# Patient Record
Sex: Female | Born: 1965
Health system: Southern US, Academic
[De-identification: ages and names within clinical notes are randomized; demographics above are authoritative.]

## PROBLEM LIST (undated history)

## (undated) ENCOUNTER — Telehealth

## (undated) ENCOUNTER — Ambulatory Visit

## (undated) ENCOUNTER — Encounter: Attending: Dermatology | Primary: Dermatology

## (undated) ENCOUNTER — Encounter
Attending: Student in an Organized Health Care Education/Training Program | Primary: Student in an Organized Health Care Education/Training Program

## (undated) ENCOUNTER — Ambulatory Visit: Payer: MEDICAID

## (undated) ENCOUNTER — Ambulatory Visit: Payer: PRIVATE HEALTH INSURANCE

## (undated) ENCOUNTER — Encounter

## (undated) ENCOUNTER — Telehealth
Attending: Student in an Organized Health Care Education/Training Program | Primary: Student in an Organized Health Care Education/Training Program

## (undated) ENCOUNTER — Encounter: Attending: Medical | Primary: Medical

## (undated) ENCOUNTER — Ambulatory Visit
Payer: MEDICAID | Attending: Student in an Organized Health Care Education/Training Program | Primary: Student in an Organized Health Care Education/Training Program

## (undated) ENCOUNTER — Ambulatory Visit: Payer: Medicaid (Managed Care)

## (undated) ENCOUNTER — Encounter: Attending: Internal Medicine | Primary: Internal Medicine

## (undated) ENCOUNTER — Ambulatory Visit
Payer: PRIVATE HEALTH INSURANCE | Attending: Student in an Organized Health Care Education/Training Program | Primary: Student in an Organized Health Care Education/Training Program

## (undated) ENCOUNTER — Ambulatory Visit: Payer: MEDICARE

## (undated) ENCOUNTER — Encounter: Attending: Adult Health | Primary: Adult Health

## (undated) ENCOUNTER — Telehealth: Attending: Plastic and Reconstructive Surgery | Primary: Plastic and Reconstructive Surgery

## (undated) ENCOUNTER — Inpatient Hospital Stay

## (undated) ENCOUNTER — Telehealth: Attending: Dermatology | Primary: Dermatology

## (undated) DIAGNOSIS — IMO0001 Reserved for inherently not codable concepts without codable children: Secondary | ICD-10-CM

## (undated) DIAGNOSIS — N390 Urinary tract infection, site not specified: Secondary | ICD-10-CM

## (undated) DIAGNOSIS — K219 Gastro-esophageal reflux disease without esophagitis: Secondary | ICD-10-CM

## (undated) DIAGNOSIS — Z973 Presence of spectacles and contact lenses: Secondary | ICD-10-CM

## (undated) DIAGNOSIS — J45909 Unspecified asthma, uncomplicated: Secondary | ICD-10-CM

## (undated) DIAGNOSIS — F419 Anxiety disorder, unspecified: Secondary | ICD-10-CM

## (undated) DIAGNOSIS — M1990 Unspecified osteoarthritis, unspecified site: Secondary | ICD-10-CM

## (undated) HISTORY — PX: UMBILICAL HERNIA REPAIR: SHX196

## (undated) HISTORY — PX: HAND SURGERY: SHX662

## (undated) HISTORY — PX: UMBILICAL HERNIA REPAIR: SUR1181

## (undated) HISTORY — PX: ABDOMINAL HYSTERECTOMY: SHX81

## (undated) HISTORY — PX: TRIGGER FINGER RELEASE: SHX641

## (undated) HISTORY — PX: TUBAL LIGATION: SHX77

## (undated) HISTORY — PX: TOE AMPUTATION: SHX809

## (undated) HISTORY — PX: DILATION AND CURETTAGE OF UTERUS: SHX78

## (undated) HISTORY — PX: MOUTH SURGERY: SHX715

## (undated) HISTORY — PX: BUNIONECTOMY: SHX129

## (undated) HISTORY — PX: JOINT REPLACEMENT: SHX530

## (undated) HISTORY — PX: TONSILLECTOMY: SUR1361

## (undated) HISTORY — PX: MULTIPLE TOOTH EXTRACTIONS: SHX2053

---
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
## 1990-07-15 HISTORY — PX: ABDOMINAL HYSTERECTOMY: SHX81

## 2010-07-15 HISTORY — PX: THUMB ARTHROSCOPY: SHX2509

## 2012-02-21 ENCOUNTER — Emergency Department (HOSPITAL_BASED_OUTPATIENT_CLINIC_OR_DEPARTMENT_OTHER)
Admission: EM | Admit: 2012-02-21 | Discharge: 2012-02-21 | Disposition: A | Discharge status: Home / Self Care | Payer: Medicaid Other | Attending: Emergency Medicine | Admitting: Emergency Medicine

## 2012-02-21 ENCOUNTER — Encounter (HOSPITAL_BASED_OUTPATIENT_CLINIC_OR_DEPARTMENT_OTHER): Payer: Self-pay | Admitting: *Deleted

## 2012-02-21 ENCOUNTER — Emergency Department (HOSPITAL_BASED_OUTPATIENT_CLINIC_OR_DEPARTMENT_OTHER): Payer: Medicaid Other

## 2012-02-21 DIAGNOSIS — R079 Chest pain, unspecified: Secondary | ICD-10-CM | POA: Insufficient documentation

## 2012-02-21 DIAGNOSIS — J189 Pneumonia, unspecified organism: Secondary | ICD-10-CM | POA: Insufficient documentation

## 2012-02-21 DIAGNOSIS — R071 Chest pain on breathing: Secondary | ICD-10-CM | POA: Insufficient documentation

## 2012-02-21 DIAGNOSIS — R0789 Other chest pain: Secondary | ICD-10-CM

## 2012-02-21 DIAGNOSIS — R0602 Shortness of breath: Secondary | ICD-10-CM | POA: Insufficient documentation

## 2012-02-21 HISTORY — DX: Reserved for inherently not codable concepts without codable children: IMO0001

## 2012-02-21 HISTORY — DX: Unspecified asthma, uncomplicated: J45.909

## 2012-02-21 HISTORY — DX: Urinary tract infection, site not specified: N39.0

## 2012-02-21 HISTORY — DX: Gastro-esophageal reflux disease without esophagitis: K21.9

## 2012-02-21 LAB — CBC WITH DIFFERENTIAL/PLATELET
Eosinophils Relative: 2 % (ref 0–5)
HCT: 40.6 % (ref 36.0–46.0)
Hemoglobin: 13.8 g/dL (ref 12.0–15.0)
Lymphocytes Relative: 40 % (ref 12–46)
Lymphs Abs: 3.7 10*3/uL (ref 0.7–4.0)
MCV: 81.5 fL (ref 78.0–100.0)
Monocytes Absolute: 0.9 10*3/uL (ref 0.1–1.0)
Monocytes Relative: 10 % (ref 3–12)
Neutro Abs: 4.5 10*3/uL (ref 1.7–7.7)
WBC: 9.3 10*3/uL (ref 4.0–10.5)

## 2012-02-21 LAB — URINALYSIS, ROUTINE W REFLEX MICROSCOPIC
Bilirubin Urine: NEGATIVE
Glucose, UA: NEGATIVE mg/dL
Hgb urine dipstick: NEGATIVE
Nitrite: NEGATIVE
Specific Gravity, Urine: 1.011 (ref 1.005–1.030)
pH: 6 (ref 5.0–8.0)

## 2012-02-21 LAB — URINE MICROSCOPIC-ADD ON

## 2012-02-21 LAB — BASIC METABOLIC PANEL
BUN: 5 mg/dL — ABNORMAL LOW (ref 6–23)
CO2: 28 mEq/L (ref 19–32)
Calcium: 9.3 mg/dL (ref 8.4–10.5)
Chloride: 99 mEq/L (ref 96–112)
Creatinine, Ser: 0.9 mg/dL (ref 0.50–1.10)
Glucose, Bld: 85 mg/dL (ref 70–99)

## 2012-02-21 LAB — TROPONIN I: Troponin I: <0.3 ng/mL (ref <0.30)

## 2012-02-21 MED ORDER — CEPHALEXIN 250 MG PO CAPS
500.0000 mg | ORAL_CAPSULE | Freq: Once | ORAL | Status: AC
  Administered 2012-02-21: 500 mg via ORAL
  Filled 2012-02-21: qty 2

## 2012-02-21 MED ORDER — KETOROLAC TROMETHAMINE 30 MG/ML IJ SOLN
30.0000 mg | Freq: Once | INTRAMUSCULAR | Status: AC
  Administered 2012-02-21: 30 mg via INTRAVENOUS
  Filled 2012-02-21: qty 1

## 2012-02-21 MED ORDER — FLUCONAZOLE 150 MG PO TABS: 150.0000 mg | ORAL_TABLET | Freq: Every day | ORAL | Status: AC

## 2012-02-21 MED ORDER — OXYCODONE-ACETAMINOPHEN 5-325 MG PO TABS: 1.0000 | ORAL_TABLET | ORAL | Status: AC | PRN

## 2012-02-21 MED ORDER — AZITHROMYCIN 250 MG PO TABS
500.0000 mg | ORAL_TABLET | Freq: Once | ORAL | Status: AC
  Administered 2012-02-21: 500 mg via ORAL
  Filled 2012-02-21: qty 2

## 2012-02-21 MED ORDER — CEPHALEXIN 500 MG PO CAPS: 500.0000 mg | ORAL_CAPSULE | Freq: Once | ORAL | Status: AC

## 2012-02-21 MED ORDER — OXYCODONE-ACETAMINOPHEN 5-325 MG PO TABS
1.0000 | ORAL_TABLET | Freq: Once | ORAL | Status: AC
  Administered 2012-02-21: 1 via ORAL
  Filled 2012-02-21: qty 1

## 2012-02-21 MED ORDER — AZITHROMYCIN 250 MG PO TABS: 250.0000 mg | ORAL_TABLET | Freq: Every day | ORAL | Status: AC

## 2012-02-21 MED ORDER — OXYCODONE-ACETAMINOPHEN 5-325 MG PO TABS
ORAL_TABLET | ORAL | Status: AC
  Administered 2012-02-21: 1 via ORAL
  Filled 2012-02-21: qty 1

## 2012-02-21 NOTE — ED Provider Notes (Signed)
History     CSN: [id number]  Arrival date & time 02/21/12  1125   First MD Initiated Contact with Patient 02/21/12 1201      Chief Complaint  Patient presents with  . Chest Pain    (Consider location/radiation/quality/duration/timing/severity/associated sxs/prior treatment) HPI Comments: Janice Johnson presents with left sided sharp chest pain which radiates into her left upper back which started last night at rest. Her pain is constant but worsened with deep inspiration and with movement such as twisting her torso or raising her left arm.  She does have a history of asthma and denies wheezing,  But has had shortness of breath along with cough which has been productive of white thick sputum.  She denies fevers and hemoptysis.  She also denies nausea or vomiting,  Diaphoresis, weakness and palpitations.  She also notes bilateral foot edema, but denies ankle or calf edema or pain.  She has taken no medicines for her pain. She states her symptoms remind her of her last episode of "walking pneumonia" several years ago.  The history is provided by the patient.    Past Medical History  Diagnosis Date  . Asthma   . Reflux   . Chronic UTI     Past Surgical History  Procedure Date  . Joint replacement   . Toe amputation   . Hand surgery   . Abdominal hysterectomy   . Mouth surgery   . Umbilical hernia repair   . Bunionectomy     x 2    No family history on file.  History  Substance Use Topics  . Smoking status: Current Everyday Smoker -- 0.5 packs/day    Types: Cigarettes  . Smokeless tobacco: Not on file  . Alcohol Use: No    OB History    Grav Para Term Preterm Abortions TAB SAB Ect Mult Living                  Review of Systems  Constitutional: Negative for fever and diaphoresis.  HENT: Negative for congestion, sore throat and neck pain.   Eyes: Negative.   Respiratory: Positive for shortness of breath. Negative for chest tightness.   Cardiovascular: Positive for  chest pain. Negative for palpitations and leg swelling.  Gastrointestinal: Negative for nausea, vomiting and abdominal pain.  Genitourinary: Negative.   Musculoskeletal: Negative for joint swelling and arthralgias.  Skin: Negative.  Negative for color change, rash and wound.  Neurological: Negative for dizziness, weakness, light-headedness, numbness and headaches.  Hematological: Negative.   Psychiatric/Behavioral: Negative.     Allergies  Codeine and Morphine and related  Home Medications   Current Outpatient Rx  Name Route Sig Dispense Refill  . ALBUTEROL SULFATE 1.25 MG/3ML IN NEBU Nebulization Take 1 ampule by nebulization every 6 (six) hours as needed.    . ALBUTEROL SULFATE (2.5 MG/3ML) 0.083% IN NEBU Nebulization Take 2.5 mg by nebulization every 6 (six) hours as needed.    Marland Kitchen FLUTICASONE-SALMETEROL 100-50 MCG/DOSE IN AEPB Inhalation Inhale 1 puff into the lungs every 12 (twelve) hours.    . AZITHROMYCIN 250 MG PO TABS Oral Take 1 tablet (250 mg total) by mouth daily. 4 tablet 0  . CEPHALEXIN 500 MG PO CAPS Oral Take 1 capsule (500 mg total) by mouth once. 40 capsule 0  . OXYCODONE-ACETAMINOPHEN 5-325 MG PO TABS Oral Take 1 tablet by mouth every 4 (four) hours as needed for pain. 15 tablet 0    BP 100/60  Pulse 69  Temp 98.5 F (  36.9 C) (Oral)  Resp 20  Ht 5\' 10"  (1.778 m)  Wt 270 lb (122.471 kg)  BMI 38.74 kg/m2  SpO2 100%  Physical Exam  Nursing note and vitals reviewed. Constitutional: She appears well-developed and well-nourished.  HENT:  Head: Normocephalic and atraumatic.  Eyes: Conjunctivae are normal.  Neck: Normal range of motion.  Cardiovascular: Normal rate, regular rhythm, normal heart sounds and intact distal pulses.   Pulmonary/Chest: Effort normal and breath sounds normal. She has no wheezes. She has no rales. She exhibits tenderness.    Abdominal: Soft. Bowel sounds are normal. There is no tenderness.  Musculoskeletal: Normal range of motion. She  exhibits no edema and no tenderness.  Neurological: She is alert.  Skin: Skin is warm and dry.  Psychiatric: She has a normal mood and affect.    ED Course  Procedures (including critical care time)  Labs Reviewed  CBC WITH DIFFERENTIAL - Abnormal; Notable for the following:    Platelets 440 (*)     All other components within normal limits  BASIC METABOLIC PANEL - Abnormal; Notable for the following:    BUN 5 (*)     GFR calc non Af Amer 75 (*)     GFR calc Af Amer 87 (*)     All other components within normal limits  URINALYSIS, ROUTINE W REFLEX MICROSCOPIC - Abnormal; Notable for the following:    Leukocytes, UA SMALL (*)     All other components within normal limits  URINE MICROSCOPIC-ADD ON - Abnormal; Notable for the following:    Squamous Epithelial / LPF FEW (*)     Bacteria, UA FEW (*)     All other components within normal limits  TROPONIN I   Dg Chest 2 View  02/21/2012  *RADIOLOGY REPORT*  Clinical Data: Chest pain.  Nausea and chills.  CHEST - 2 VIEW  Comparison: None.  Findings: Patchy airspace opacities present in the right lower lobe.  This is suspicious for pneumonia over atelectasis.  Left base appears within normal limits.  Cardiopericardial silhouette normal. Monitoring leads are projected over the chest.  IMPRESSION: Patchy right lower lobe airspace disease favored to represent pneumonia over atelectasis.  Original Report Authenticated By: Andreas Newport, M.D.     1. Community acquired pneumonia     Pt given zithromax 500 mg po,  keflext 500 mg po prior to dc home.  MDM  Pt is perc negative.  No risk factors for PE/dvt.   Re-exam,  Still with no wheezing,  No respiratory distress.  Pt to continue taking her albuterol nebs,  Advair,  Prescribed oxycodone for pain and cough,  zithromax and keflex to cover for community acquired pneumonia.     Labs , xrays reviewed prior to dc home. Urine cx ordered. Suspect pain at least partially from increased cough  causing chest wall pain/musculoskeletal,  Reproducible.   Date: 02/21/2012  Rate: 70  Rhythm: normal sinus rhythm  QRS Axis: normal  Intervals: normal  ST/T Wave abnormalities: normal  Conduction Disutrbances:none  Narrative Interpretation:  Possible LVH with J point elevation in V2.  Old EKG Reviewed: none available          Burgess Amor, [place] 02/21/12 1436  Burgess Amor, [place] 02/21/12 1438   Prior to dc,  Pt reports abx gives her vaginal yeast infections,  Diflucan150 mg x 1 prescribed.    Burgess Amor, PA 02/21/12 1447

## 2012-02-22 NOTE — ED Provider Notes (Signed)
Medical screening examination/treatment/procedure(s) were performed by non-physician practitioner and as supervising physician I was immediately available for consultation/collaboration.   Hurman Horn, MD 02/22/12 [phone]

## 2013-02-20 IMAGING — CR DG CHEST 2V
2 series · 2 of 2 positions shown · non-contrast
Comparison: None.

CLINICAL DATA: Chest pain.  Nausea and chills.

CHEST - 2 VIEW

[w chest pa]
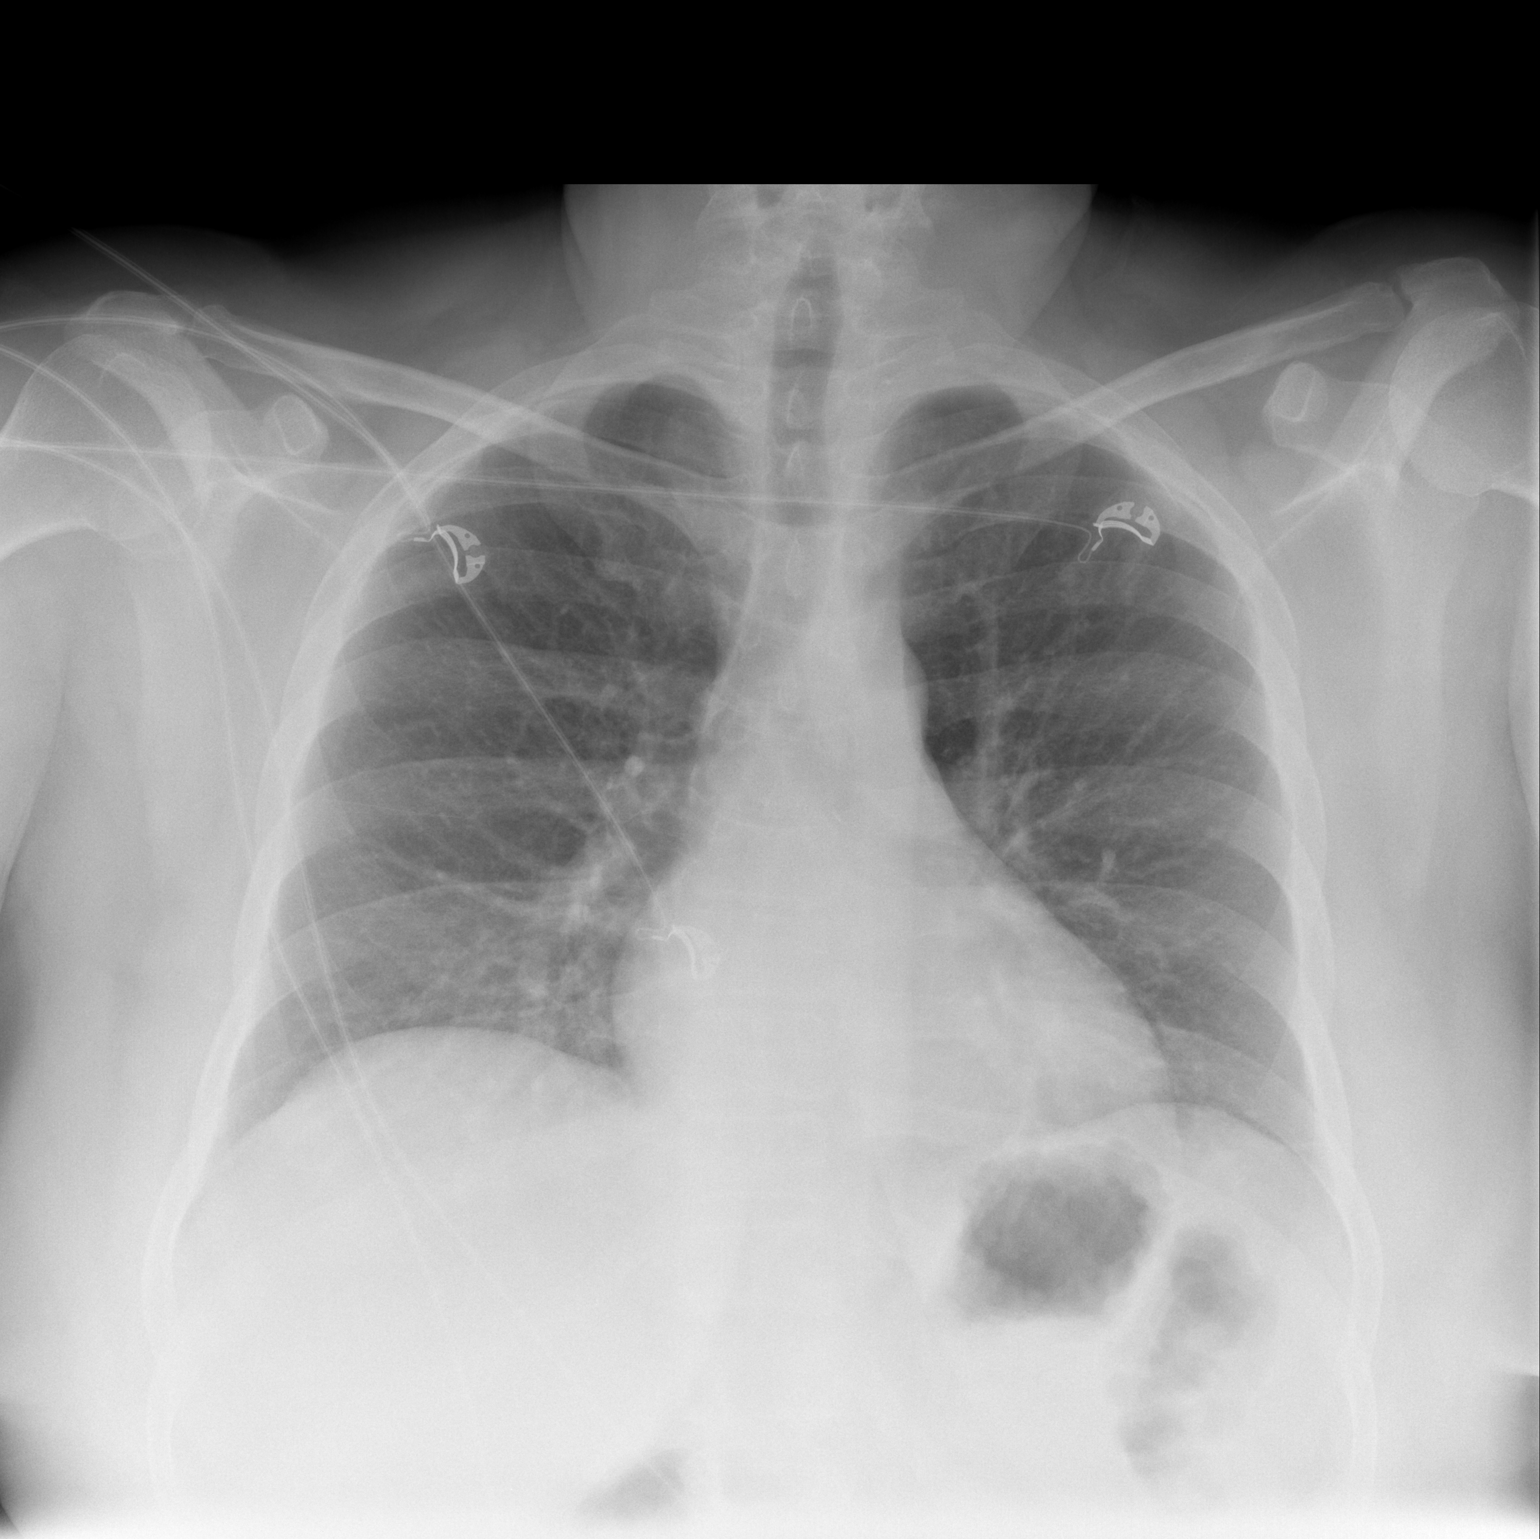

[w chest lat]
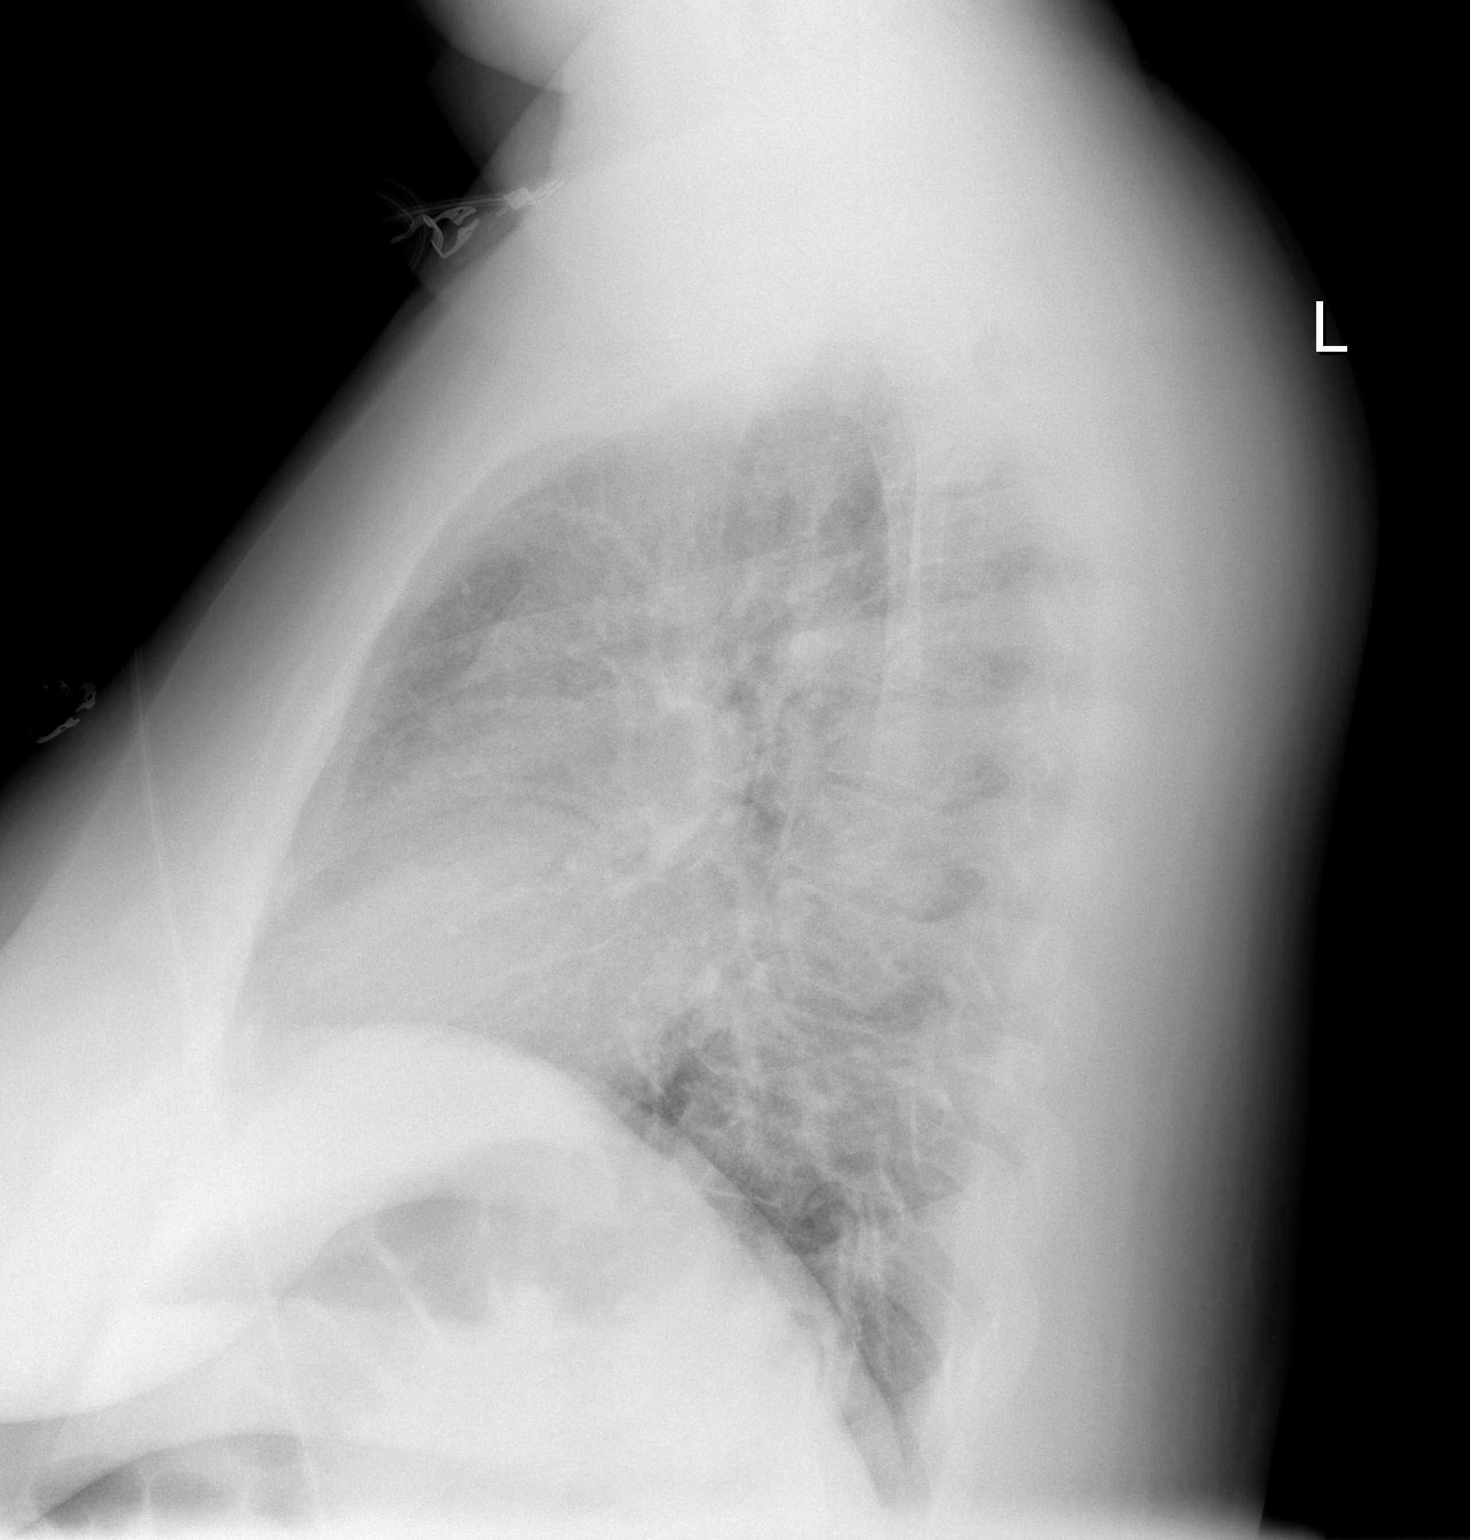

[2 of 2 positions shown; findings below may reference images not displayed]

FINDINGS: Patchy airspace opacities present in the right lower
lobe.  This is suspicious for pneumonia over atelectasis.  Left
base appears within normal limits.  Cardiopericardial silhouette
normal. Monitoring leads are projected over the chest.
IMPRESSION: Patchy right lower lobe airspace disease favored to represent
pneumonia over atelectasis.

## 2013-05-13 ENCOUNTER — Encounter: Payer: Self-pay | Admitting: Physical Medicine and Rehabilitation

## 2013-05-24 ENCOUNTER — Encounter: Payer: Medicaid Other | Admitting: Physical Medicine and Rehabilitation

## 2013-05-24 NOTE — Progress Notes (Unsigned)
  Subjective:    Patient ID: Janice Johnson, female    DOB: Sep 25, 1965, 47 y.o.   MRN: [id number]  HPI Pain Inventory Average Pain 10 Pain Right Now 10 My pain is constant and aching  In the last 24 hours, has pain interfered with the following? General activity 7 Relation with others 5 Enjoyment of life 8 What TIME of day is your pain at its worst? night Sleep (in general) Poor  Pain is worse with: walking, bending, sitting, inactivity and standing Pain improves with: nothing Relief from Meds: no pain meds  Mobility walk without assistance how many minutes can you walk? 10 with pain ability to climb steps?  yes do you drive?  no transfers alone Do you have any goals in this area?  no  Function not employed: date last employed na Do you have any goals in this area?  no  Neuro/Psych bladder control problems trouble walking  Prior Studies Any changes since last visit?  no  Physicians involved in your care Any changes since last visit?  no   No family history on file. History   Social History  . Marital Status: Single    Spouse Name: N/A    Number of Children: N/A  . Years of Education: N/A   Social History Main Topics  . Smoking status: Current Every Day Smoker -- 0.50 packs/day    Types: Cigarettes  . Smokeless tobacco: Not on file  . Alcohol Use: No  . Drug Use: No  . Sexual Activity: Yes    Birth Control/ Protection: Surgical   Other Topics Concern  . Not on file   Social History Narrative  . No narrative on file   Past Surgical History  Procedure Laterality Date  . Joint replacement    . Toe amputation    . Hand surgery    . Abdominal hysterectomy    . Mouth surgery    . Umbilical hernia repair    . Bunionectomy      x 2   Past Medical History  Diagnosis Date  . Asthma   . Reflux   . Chronic UTI    BP 144/59  Pulse 78  Resp 14  Ht 5\' 8"  (1.727 m)  Wt 270 lb (122.471 kg)  BMI 41.06 kg/m2  SpO2 100%      Review of Systems   Endocrine:       Low blood sugar  Genitourinary:       Bladder control problems  Musculoskeletal: Positive for back pain and gait problem.  All other systems reviewed and are negative.       Objective:   Physical Exam        Assessment & Plan:  Patient not seen because she was upset that she would not be prescribed narcotics at today's visit. Urine discarded and refund was given to patient.

## 2013-09-15 ENCOUNTER — Encounter (HOSPITAL_BASED_OUTPATIENT_CLINIC_OR_DEPARTMENT_OTHER): Payer: Self-pay | Admitting: *Deleted

## 2013-09-15 ENCOUNTER — Other Ambulatory Visit: Payer: Self-pay | Admitting: Orthopedic Surgery

## 2013-09-15 NOTE — Progress Notes (Signed)
Pt has asthma and denies any cardiac or sleep apnea Had rt thumb done [place] 2012 with no problems

## 2013-09-16 ENCOUNTER — Encounter (HOSPITAL_BASED_OUTPATIENT_CLINIC_OR_DEPARTMENT_OTHER): Payer: Self-pay | Admitting: *Deleted

## 2013-09-17 NOTE — H&P (Signed)
Janice Johnson is an 48 y.o. female.   CC / Reason for Visit: Left hand and wrist pain HPI: This patient is a 48 year old female whom I have previously treated while at [facility].  I performed a right LRTI and de Quervain's release.  She believes this was in August of either 2012 are 2013.  She reports that she has had recent progressive symptoms of the same kind on the left side and desires to proceed surgically.  She has also had injections and splinting and continues to have symptoms.  Past Medical History  Diagnosis Date  . Reflux   . Chronic UTI   . Asthma   . GERD (gastroesophageal reflux disease)   . Arthritis   . Wears glasses     Past Surgical History  Procedure Laterality Date  . Joint replacement    . Toe amputation    . Hand surgery    . Abdominal hysterectomy    . Mouth surgery    . Umbilical hernia repair    . Bunionectomy      x 2  . Tonsillectomy    . Dilation and curettage of uterus    . Abdominal hysterectomy  1992  . Tubal ligation    . Multiple tooth extractions    . Umbilical hernia repair      age 89  . Toe amputation      right pinky toe  . Thumb arthroscopy  2012    reconstruction right thumb    History reviewed. No pertinent family history. Social History:  reports that she has been smoking Cigarettes.  She has been smoking about 0.50 packs per day. She does not have any smokeless tobacco history on file. She reports that she does not drink alcohol or use illicit drugs.  Allergies:  Allergies  Allergen Reactions  . Codeine   . Codeine Itching  . Morphine And Related     No prescriptions prior to admission    No results found for this or any previous visit (from the past 48 hour(s)). No results found.  Review of Systems  All other systems reviewed and are negative.    Height 5\' 10"  (1.778 m), weight 122.471 kg (270 lb). Physical Exam  Constitutional:  WD, WN, NAD HEENT:  NCAT, EOMI Neuro/Psych:  Alert &  oriented to person, place, and time; appropriate mood & affect Lymphatic: No generalized UE edema or lymphadenopathy Extremities / MSK:  Both UE are normal with respect to appearance, ranges of motion, joint stability, muscle strength/tone, sensation, & perfusion except as otherwise noted:  The right sided incisions have healed nicely.  On the left side, she has pain with TMC stress and grind testing, as well as tenderness over the first dorsal compartment.  Finkelstein and EPB stress are positive.  Key pinch: Right 11/left 3 and painful.  Labs / Xrays:  Left TMC series ordered and obtained today reveals grade 2-3 arthritic change the left TMC joint, and evidence for prior trapeziectomy on the right.    Assessment: Left TMC osteoarthritis and de Quervain's tenosynovitis  Plan:  I discussed these findings with her and we reviewed details of operative treatment, specifically a thumb suspension arthroplasty (LRTI) with a de Quervain's release.  We will likely proceed next week.  The details of the operative procedure were discussed with the patient.  Questions were invited and answered.  In addition to the goal of the procedure, the risks of the procedure to include but not limited to bleeding;  infection; damage to the nerves or blood vessels that could result in bleeding, numbness, weakness, chronic pain, and the need for additional procedures; stiffness; the need for revision surgery; and anesthetic risks, the worst of which is death, were reviewed.  No specific outcome was guaranteed or implied.  Informed consent was obtained.    Janice Johnson A. 09/17/2013, 12:20 PM

## 2013-09-20 ENCOUNTER — Ambulatory Visit (HOSPITAL_BASED_OUTPATIENT_CLINIC_OR_DEPARTMENT_OTHER)
Admission: RE | Admit: 2013-09-20 | Discharge: 2013-09-20 | Disposition: A | Discharge status: Home / Self Care | Payer: Medicaid Other | Source: Ambulatory Visit | Attending: Orthopedic Surgery | Admitting: Orthopedic Surgery

## 2013-09-20 ENCOUNTER — Encounter (HOSPITAL_BASED_OUTPATIENT_CLINIC_OR_DEPARTMENT_OTHER)
Admission: RE | Disposition: A | Discharge status: Home / Self Care | Payer: Self-pay | Source: Ambulatory Visit | Attending: Orthopedic Surgery

## 2013-09-20 ENCOUNTER — Encounter (HOSPITAL_BASED_OUTPATIENT_CLINIC_OR_DEPARTMENT_OTHER): Payer: Self-pay | Admitting: Anesthesiology

## 2013-09-20 ENCOUNTER — Encounter (HOSPITAL_BASED_OUTPATIENT_CLINIC_OR_DEPARTMENT_OTHER): Payer: Medicaid Other | Admitting: Anesthesiology

## 2013-09-20 ENCOUNTER — Ambulatory Visit (HOSPITAL_BASED_OUTPATIENT_CLINIC_OR_DEPARTMENT_OTHER): Payer: Medicaid Other | Admitting: Anesthesiology

## 2013-09-20 DIAGNOSIS — Z9660 Presence of unspecified orthopedic joint implant: Secondary | ICD-10-CM | POA: Insufficient documentation

## 2013-09-20 DIAGNOSIS — Z885 Allergy status to narcotic agent status: Secondary | ICD-10-CM | POA: Insufficient documentation

## 2013-09-20 DIAGNOSIS — F17200 Nicotine dependence, unspecified, uncomplicated: Secondary | ICD-10-CM | POA: Insufficient documentation

## 2013-09-20 DIAGNOSIS — K219 Gastro-esophageal reflux disease without esophagitis: Secondary | ICD-10-CM | POA: Insufficient documentation

## 2013-09-20 DIAGNOSIS — M654 Radial styloid tenosynovitis [de Quervain]: Secondary | ICD-10-CM | POA: Insufficient documentation

## 2013-09-20 DIAGNOSIS — N390 Urinary tract infection, site not specified: Secondary | ICD-10-CM | POA: Insufficient documentation

## 2013-09-20 DIAGNOSIS — J45909 Unspecified asthma, uncomplicated: Secondary | ICD-10-CM | POA: Insufficient documentation

## 2013-09-20 DIAGNOSIS — M19049 Primary osteoarthritis, unspecified hand: Secondary | ICD-10-CM | POA: Insufficient documentation

## 2013-09-20 DIAGNOSIS — S98139A Complete traumatic amputation of one unspecified lesser toe, initial encounter: Secondary | ICD-10-CM | POA: Insufficient documentation

## 2013-09-20 HISTORY — DX: Presence of spectacles and contact lenses: Z97.3

## 2013-09-20 HISTORY — PX: DORSAL COMPARTMENT RELEASE: SHX5039

## 2013-09-20 HISTORY — PX: CARPOMETACARPEL SUSPENSION PLASTY: SHX5005

## 2013-09-20 HISTORY — DX: Unspecified osteoarthritis, unspecified site: M19.90

## 2013-09-20 HISTORY — DX: Gastro-esophageal reflux disease without esophagitis: K21.9

## 2013-09-20 SURGERY — CARPOMETACARPEL (CMC) SUSPENSION PLASTY
Anesthesia: General | Site: Thumb | Laterality: Left

## 2013-09-20 MED ORDER — EPHEDRINE SULFATE 50 MG/ML IJ SOLN
INTRAMUSCULAR | Status: DC | PRN
  Administered 2013-09-20: 10 mg via INTRAVENOUS

## 2013-09-20 MED ORDER — LIDOCAINE HCL (CARDIAC) 20 MG/ML IV SOLN
INTRAVENOUS | Status: DC | PRN
  Administered 2013-09-20: 100 mg via INTRAVENOUS

## 2013-09-20 MED ORDER — PROPOFOL 10 MG/ML IV BOLUS
INTRAVENOUS | Status: DC | PRN
  Administered 2013-09-20: 350 mg via INTRAVENOUS

## 2013-09-20 MED ORDER — DIPHENHYDRAMINE HCL 50 MG/ML IJ SOLN
INTRAMUSCULAR | Status: AC
  Filled 2013-09-20: qty 1

## 2013-09-20 MED ORDER — FENTANYL CITRATE 0.05 MG/ML IJ SOLN: 50.0000 ug | INTRAMUSCULAR | Status: DC | PRN

## 2013-09-20 MED ORDER — MIDAZOLAM HCL 2 MG/2ML IJ SOLN: 1.0000 mg | INTRAMUSCULAR | Status: DC | PRN

## 2013-09-20 MED ORDER — OXYCODONE HCL 5 MG PO TABS: 5.0000 mg | ORAL_TABLET | Freq: Once | ORAL | Status: DC | PRN

## 2013-09-20 MED ORDER — FENTANYL CITRATE 0.05 MG/ML IJ SOLN
INTRAMUSCULAR | Status: DC | PRN
  Administered 2013-09-20 (×4): 25 ug via INTRAVENOUS
  Administered 2013-09-20: 100 ug via INTRAVENOUS

## 2013-09-20 MED ORDER — HYDROMORPHONE HCL PF 1 MG/ML IJ SOLN
INTRAMUSCULAR | Status: AC
  Filled 2013-09-20: qty 1

## 2013-09-20 MED ORDER — ONDANSETRON HCL 4 MG/2ML IJ SOLN
INTRAMUSCULAR | Status: DC | PRN
  Administered 2013-09-20: 4 mg via INTRAVENOUS

## 2013-09-20 MED ORDER — CEFAZOLIN SODIUM-DEXTROSE 2-3 GM-% IV SOLR
2.0000 g | INTRAVENOUS | Status: AC
  Administered 2013-09-20: 3 g via INTRAVENOUS

## 2013-09-20 MED ORDER — LIDOCAINE-EPINEPHRINE 1 %-1:100000 IJ SOLN
INTRAMUSCULAR | Status: AC
  Filled 2013-09-20: qty 1

## 2013-09-20 MED ORDER — LACTATED RINGERS IV SOLN
INTRAVENOUS | Status: DC
  Administered 2013-09-20: 08:00:00 via INTRAVENOUS

## 2013-09-20 MED ORDER — DIPHENHYDRAMINE HCL 50 MG/ML IJ SOLN
12.5000 mg | Freq: Once | INTRAMUSCULAR | Status: AC
  Administered 2013-09-20: 12.5 mg via INTRAVENOUS

## 2013-09-20 MED ORDER — BUPIVACAINE-EPINEPHRINE 0.5% -1:200000 IJ SOLN
INTRAMUSCULAR | Status: DC | PRN
  Administered 2013-09-20: 20 mL

## 2013-09-20 MED ORDER — OXYCODONE HCL 5 MG/5ML PO SOLN: 5.0000 mg | Freq: Once | ORAL | Status: DC | PRN

## 2013-09-20 MED ORDER — OXYCODONE-ACETAMINOPHEN 5-325 MG PO TABS: 1.0000 | ORAL_TABLET | ORAL | Status: DC | PRN

## 2013-09-20 MED ORDER — DEXAMETHASONE SODIUM PHOSPHATE 4 MG/ML IJ SOLN
INTRAMUSCULAR | Status: DC | PRN
  Administered 2013-09-20: 10 mg via INTRAVENOUS

## 2013-09-20 MED ORDER — ONDANSETRON HCL 4 MG/2ML IJ SOLN: 4.0000 mg | Freq: Once | INTRAMUSCULAR | Status: DC | PRN

## 2013-09-20 MED ORDER — HYDROMORPHONE HCL PF 1 MG/ML IJ SOLN
0.2500 mg | INTRAMUSCULAR | Status: DC | PRN
  Administered 2013-09-20 (×4): 0.5 mg via INTRAVENOUS

## 2013-09-20 MED ORDER — LACTATED RINGERS IV SOLN
INTRAVENOUS | Status: DC
  Administered 2013-09-20: 20 mL/h via INTRAVENOUS

## 2013-09-20 MED ORDER — MIDAZOLAM HCL 5 MG/5ML IJ SOLN
INTRAMUSCULAR | Status: DC | PRN
  Administered 2013-09-20: 2 mg via INTRAVENOUS

## 2013-09-20 MED ORDER — HYDROMORPHONE HCL PF 1 MG/ML IJ SOLN: 0.5000 mg | INTRAMUSCULAR | Status: DC | PRN

## 2013-09-20 MED ORDER — CEFAZOLIN SODIUM-DEXTROSE 2-3 GM-% IV SOLR
INTRAVENOUS | Status: AC
  Filled 2013-09-20: qty 50

## 2013-09-20 MED ORDER — HYDROMORPHONE HCL PF 1 MG/ML IJ SOLN
0.2500 mg | INTRAMUSCULAR | Status: DC | PRN
  Administered 2013-09-20 (×2): 0.5 mg via INTRAVENOUS

## 2013-09-20 MED ORDER — LACTATED RINGERS IV SOLN
INTRAVENOUS | Status: DC | PRN
  Administered 2013-09-20: 07:00:00 via INTRAVENOUS

## 2013-09-20 MED ORDER — CHLORHEXIDINE GLUCONATE 4 % EX LIQD: 60.0000 mL | Freq: Once | CUTANEOUS | Status: DC

## 2013-09-20 SURGICAL SUPPLY — 62 items
BIT DRILL 11/64XX180123XX4 (BIT) ×1
BIT DRILL 11/64XX180123XX4.3 (BIT) ×1 IMPLANT
BLADE AVERAGE 25MMX9MM (BLADE) ×1
BLADE AVERAGE 25X9 (BLADE) ×2 IMPLANT
BLADE MINI RND TIP GREEN BEAV (BLADE) IMPLANT
BLADE SURG 15 STRL LF DISP TIS (BLADE) ×2 IMPLANT
BLADE SURG 15 STRL SS (BLADE) ×4
BNDG COHESIVE 3X5 TAN STRL LF (GAUZE/BANDAGES/DRESSINGS) ×3 IMPLANT
BNDG COHESIVE 4X5 TAN STRL (GAUZE/BANDAGES/DRESSINGS) ×3 IMPLANT
BNDG ESMARK 4X9 LF (GAUZE/BANDAGES/DRESSINGS) ×3 IMPLANT
BNDG GAUZE ELAST 4 BULKY (GAUZE/BANDAGES/DRESSINGS) ×6 IMPLANT
CHLORAPREP W/TINT 26ML (MISCELLANEOUS) ×3 IMPLANT
CORDS BIPOLAR (ELECTRODE) ×3 IMPLANT
COVER MAYO STAND STRL (DRAPES) ×3 IMPLANT
COVER TABLE BACK 60X90 (DRAPES) ×3 IMPLANT
CUFF TOURNIQUET SINGLE 18IN (TOURNIQUET CUFF) ×3 IMPLANT
DECANTER SPIKE VIAL GLASS SM (MISCELLANEOUS) IMPLANT
DRAPE EXTREMITY T 121X128X90 (DRAPE) ×3 IMPLANT
DRAPE SURG 17X23 STRL (DRAPES) ×3 IMPLANT
DRILL BIT 1/8DIAX5INL DISPOSE (BIT) ×3 IMPLANT
DRILL BIT 11/64XX180123XX4.3 (BIT) ×2
DRSG EMULSION OIL 3X3 NADH (GAUZE/BANDAGES/DRESSINGS) ×3 IMPLANT
GLOVE BIO SURGEON STRL SZ7.5 (GLOVE) ×3 IMPLANT
GLOVE BIOGEL PI IND STRL 7.0 (GLOVE) ×1 IMPLANT
GLOVE BIOGEL PI IND STRL 8 (GLOVE) ×1 IMPLANT
GLOVE BIOGEL PI INDICATOR 7.0 (GLOVE) ×2
GLOVE BIOGEL PI INDICATOR 8 (GLOVE) ×2
GLOVE ECLIPSE 6.5 STRL STRAW (GLOVE) ×3 IMPLANT
GOWN STRL REUS W/ TWL LRG LVL3 (GOWN DISPOSABLE) ×2 IMPLANT
GOWN STRL REUS W/TWL LRG LVL3 (GOWN DISPOSABLE) ×4
K-WIRE .062X4 (WIRE) ×3 IMPLANT
LOOP VESSEL MAXI BLUE (MISCELLANEOUS) IMPLANT
LOOP VESSEL MINI RED (MISCELLANEOUS) IMPLANT
NDL SAFETY ECLIPSE 18X1.5 (NEEDLE) IMPLANT
NEEDLE HYPO 18GX1.5 SHARP (NEEDLE)
NEEDLE HYPO 25X1 1.5 SAFETY (NEEDLE) ×3 IMPLANT
NS IRRIG 1000ML POUR BTL (IV SOLUTION) ×3 IMPLANT
PACK BASIN DAY SURGERY FS (CUSTOM PROCEDURE TRAY) ×3 IMPLANT
PADDING CAST ABS 3INX4YD NS (CAST SUPPLIES)
PADDING CAST ABS 4INX4YD NS (CAST SUPPLIES) ×2
PADDING CAST ABS COTTON 3X4 (CAST SUPPLIES) IMPLANT
PADDING CAST ABS COTTON 4X4 ST (CAST SUPPLIES) ×1 IMPLANT
SHEET MEDIUM DRAPE 40X70 STRL (DRAPES) ×3 IMPLANT
SLEEVE SCD COMPRESS KNEE MED (MISCELLANEOUS) ×3 IMPLANT
SPLINT FAST PLASTER 5X30 (CAST SUPPLIES)
SPLINT PLASTER CAST FAST 5X30 (CAST SUPPLIES) IMPLANT
SPLINT PLASTER CAST XFAST 3X15 (CAST SUPPLIES) ×8 IMPLANT
SPLINT PLASTER XTRA FASTSET 3X (CAST SUPPLIES) ×16
SPONGE GAUZE 4X4 12PLY (GAUZE/BANDAGES/DRESSINGS) ×3 IMPLANT
SPONGE GAUZE 4X4 12PLY STER LF (GAUZE/BANDAGES/DRESSINGS) ×3 IMPLANT
STOCKINETTE 4X48 STRL (DRAPES) ×3 IMPLANT
SUT ETHIBOND 3-0 V-5 (SUTURE) ×3 IMPLANT
SUT STEEL 4 (SUTURE) ×3 IMPLANT
SUT VICRYL RAPIDE 4-0 (SUTURE) IMPLANT
SUT VICRYL RAPIDE 4/0 PS 2 (SUTURE) ×6 IMPLANT
SYR BULB 3OZ (MISCELLANEOUS) ×3 IMPLANT
SYR CONTROL 10ML LL (SYRINGE) ×3 IMPLANT
SYRINGE 10CC LL (SYRINGE) IMPLANT
SYSTEM CHEST DRAIN TLS 7FR (DRAIN) IMPLANT
TOWEL OR 17X24 6PK STRL BLUE (TOWEL DISPOSABLE) ×3 IMPLANT
TOWEL OR NON WOVEN STRL DISP B (DISPOSABLE) ×3 IMPLANT
UNDERPAD 30X30 INCONTINENT (UNDERPADS AND DIAPERS) ×3 IMPLANT

## 2013-09-20 NOTE — Anesthesia Procedure Notes (Signed)
Procedure Name: LMA Insertion Date/Time: 09/20/2013 7:53 AM Performed by: Gar GibbonKEETON, Jahron Hunsinger S Pre-anesthesia Checklist: Patient identified, Emergency Drugs available, Suction available and Patient being monitored Patient Re-evaluated:Patient Re-evaluated prior to inductionOxygen Delivery Method: Circle System Utilized Preoxygenation: Pre-oxygenation with 100% oxygen Intubation Type: IV induction Ventilation: Mask ventilation without difficulty LMA: LMA with gastric port inserted LMA Size: 4.0 Number of attempts: 1 Placement Confirmation: positive ETCO2 Tube secured with: Tape Dental Injury: Teeth and Oropharynx as per pre-operative assessment

## 2013-09-20 NOTE — Interval H&P Note (Signed)
History and Physical Interval Note:  09/20/2013 7:44 AM  Janice Johnson  has presented today for surgery, with the diagnosis of LEFT THUMB TMC OA/DEQUARVAIN TENOSYNOVITIS  The various methods of treatment have been discussed with the patient and family. After consideration of risks, benefits and other options for treatment, the patient has consented to  Procedure(s) with comments: LEFT THUMB SUSPENSION ARTHROPLASTY (Left) - ANESTHESIA: GENERAL/PRE-OP BLOCK LEFT THUMB DEQUERVAIN RELEASE (Left) as a surgical intervention .  The patient's history has been reviewed, patient examined, no change in status, stable for surgery.  I have reviewed the patient's chart and labs.  Questions were answered to the patient's satisfaction.     Miaa Latterell A.

## 2013-09-20 NOTE — Anesthesia Preprocedure Evaluation (Signed)
Anesthesia Evaluation  Patient identified by MRN, date of birth, ID band Patient awake    Reviewed: Allergy & Precautions, H&P , NPO status , Patient's Chart, lab work & pertinent test results  Airway Mallampati: I TM Distance: >3 FB Neck ROM: Full    Dental  (+) Edentulous Upper, Edentulous Lower, Dental Advisory Given   Pulmonary asthma , Current Smoker,  breath sounds clear to auscultation        Cardiovascular Rhythm:Regular Rate:Normal     Neuro/Psych    GI/Hepatic GERD-  Medicated and Controlled,  Endo/Other    Renal/GU      Musculoskeletal   Abdominal   Peds  Hematology   Anesthesia Other Findings   Reproductive/Obstetrics                           Anesthesia Physical Anesthesia Plan  ASA: III  Anesthesia Plan: General   Post-op Pain Management:    Induction: Intravenous  Airway Management Planned: LMA  Additional Equipment:   Intra-op Plan:   Post-operative Plan: Extubation in OR  Informed Consent: I have reviewed the patients History and Physical, chart, labs and discussed the procedure including the risks, benefits and alternatives for the proposed anesthesia with the patient or authorized representative who has indicated his/her understanding and acceptance.   Dental advisory given  Plan Discussed with: CRNA, Anesthesiologist and Surgeon  Anesthesia Plan Comments:         Anesthesia Quick Evaluation

## 2013-09-20 NOTE — Transfer of Care (Signed)
Immediate Anesthesia Transfer of Care Note  Patient: Janice Johnson  Procedure(s) Performed: Procedure(s): LEFT THUMB SUSPENSION ARTHROPLASTY (Left) LEFT THUMB DEQUERVAIN RELEASE (Left)  Patient Location: PACU  Anesthesia Type:General  Level of Consciousness: awake, sedated, patient cooperative and confused  Airway & Oxygen Therapy: Patient Spontanous Breathing and Patient connected to face mask oxygen  Post-op Assessment: Report given to PACU RN and Post -op Vital signs reviewed and stable  Post vital signs: Reviewed and stable  Complications: No apparent anesthesia complications

## 2013-09-20 NOTE — Op Note (Signed)
09/20/2013  10:54 AM  PATIENT:  Janice Johnson  48 y.o. female  PRE-OPERATIVE DIAGNOSIS:  Left TMC osteoarthritis and de Quervain's tenosynovitis  POST-OPERATIVE DIAGNOSIS:  Same  PROCEDURE:  Left thumb suspension arthroplasty (LRTI) and de Quervain's release/first dorsal compartment plasty  SURGEON: Cliffton Asters. Janee Morn, MD  PHYSICIAN ASSISTANT: None  ANESTHESIA:  general  SPECIMENS:  None  DRAINS:   None  PREOPERATIVE INDICATIONS:  Josee Speece is a  48 y.o. female with left TMC OA and de Quervain's tenosynovitis  The risks benefits and alternatives were discussed with the patient preoperatively including but not limited to the risks of infection, bleeding, nerve injury, cardiopulmonary complications, the need for revision surgery, among others, and the patient verbalized understanding and consented to proceed.  OPERATIVE IMPLANTS: None other than suture  OPERATIVE PROCEDURE:  After receiving prophylactic antibiotics, the patient was escorted to the operative theatre and placed in a supine position.  General anesthesia was administered A surgical "time-out" was performed during which the planned procedure, proposed operative site, and the correct patient identity were compared to the operative consent and agreement confirmed by the circulating nurse according to current facility policy.  Following application of a tourniquet to the operative extremity, the exposed skin was prepped with Chloraprep and draped in the usual sterile fashion.  The limb was exsanguinated with an Esmarch bandage and the tourniquet inflated to approximately higher than systolic BP.  A Wagner incision was made sharply with a scalpel. Subcutaneous tissues were dissected with blunt and spreading dissection with care to identify and elevate superficial radial nerve branches and the dorsal flap. There was an accessory APL tendon inserting into the thenar muscles, and longitudinal split was made between the  true APL and the accessory APL exposing the joint. The joint was inspected found to be substantially arthritic and then the capsule was reflected off the trapezium across the dorsum and also some volarly with care to look for protect the FCR tendon. The trapezium was excised piecemeal. The scaphotrapezoid joint was inspected and found not to require additional surgical treatment. The base of the metacarpal was resected with a saw. Interposition space was irrigated. A hole was made in the metacarpal base for passage of the suspensory ligament by first using a 0.062 inch K wire and then successively enlarging it with 2 different drill bit. A longitudinal half strip of the FCR was harvested in standard fashion and brought through the tunnel the first metacarpal where it was passed then proximally and down deep through the capsule into the interposition space. It was tied and a half hitch upon itself around the intact portion of the FCR and tension with the thumb neither distracted or compressed. The knot was secured with 30 Mersilene suture which was also used to secure the tendon to the dorsal periosteum at the base of the metacarpal. The remainder of the harvested FCR was then fanfolded an anchovy style and secured into the interposition space with 30 Mersilene suture that had first been placed in the depths of the resection space in the deep capsule. 30 Mersilene was used to close the capsular layer and reapproximate the 2 portions of the APL tendon. An oblique incision was made over the first dorsal compartment tendons. Skin was incised sharply with a scalpel and subcutaneous tissues dissected bluntly spreading dissection identifying and protecting the superficial radial nerve. The first dorsal compartment sheath was split in a Z-plasty fashion and then reapproximated with 4-0 Vicryl Rapide interrupted suture with the Z-plasty  flaps and elongated fashion creating more space for the tendons. The tendons hasn't  thickened tenosynovium upon them and there was no separate sub-sheath for the EPB tendon. All wounds were copiously irrigated and then closed with 4-0 Vicryl Rapide interrupted sutures or horizontal mattress running sutures in combination. A total of 20 mL's of half percent Marcaine with epinephrine was instilled in and around the regions of the incisions the interposition space and a short arm thumb spica splint with a plaster component applied. She was awakened and taken to room stable condition breathing spontaneously  Disposition: She'll be discharged home today with typical postop instructions, returning in 10-15 days. No x-rays required she should have a follow-on appointment with hand therapy to have a splint constructed, forearm-based thumb spica splint, and begin rehabilitation.

## 2013-09-20 NOTE — Anesthesia Postprocedure Evaluation (Signed)
  Anesthesia Post-op Note  Patient: Janice Johnson  Procedure(s) Performed: Procedure(s): LEFT THUMB SUSPENSION ARTHROPLASTY (Left) LEFT THUMB DEQUERVAIN RELEASE (Left)  Patient Location: PACU  Anesthesia Type:General  Level of Consciousness: awake, alert  and oriented  Airway and Oxygen Therapy: Patient Spontanous Breathing  Post-op Pain: moderate  Post-op Assessment: Post-op Vital signs reviewed  Post-op Vital Signs: Reviewed  Complications: No apparent anesthesia complications

## 2013-09-20 NOTE — Discharge Instructions (Addendum)
Discharge Instructions ° ° °You have a dressing with a plaster splint incorporated in it. °Move your fingers as much as possible, making a full fist and fully opening the fist. °Elevate your hand to reduce pain & swelling of the digits.  Ice over the operative site may be helpful to reduce pain & swelling.  DO NOT USE HEAT. °Pain medicine has been prescribed for you.  °Use your medicine as needed over the first 48 hours, and then you can begin to taper your use.  You may use Tylenol in place of your prescribed pain medication, but not IN ADDITION to it. °Leave the dressing in place until you return to our office.  °You may shower, but keep the bandage clean & dry.  °You may drive a car when you are off of prescription pain medications and can safely control your vehicle with both hands. °Our office will call you to arrange follow-up ° ° °Please call [phone] during normal business hours or [phone] after hours for any problems. Including the following: ° °- excessive redness of the incisions °- drainage for more than 4 days °- fever of more than 101.5 F ° °*Please note that pain medications will not be refilled after hours or on weekends. ° ° °Post Anesthesia Home Care Instructions ° °Activity: °Get plenty of rest for the remainder of the day. A responsible adult should stay with you for 24 hours following the procedure.  °For the next 24 hours, DO NOT: °-Drive a car °-Operate machinery °-Drink alcoholic beverages °-Take any medication unless instructed by your physician °-Make any legal decisions or sign important papers. ° °Meals: °Start with liquid foods such as gelatin or soup. Progress to regular foods as tolerated. Avoid greasy, spicy, heavy foods. If nausea and/or vomiting occur, drink only clear liquids until the nausea and/or vomiting subsides. Call your physician if vomiting continues. ° °Special Instructions/Symptoms: °Your throat may feel dry or sore from the anesthesia or the breathing tube  placed in your throat during surgery. If this causes discomfort, gargle with warm salt water. The discomfort should disappear within 24 hours. ° °

## 2013-09-20 NOTE — Addendum Note (Signed)
Addendum created 09/20/13 1047 by Gar Gibbonennis S Leen Tworek, CRNA   Modules edited: Charges VN

## 2013-09-22 ENCOUNTER — Encounter (HOSPITAL_BASED_OUTPATIENT_CLINIC_OR_DEPARTMENT_OTHER): Payer: Self-pay | Admitting: Orthopedic Surgery

## 2013-10-06 ENCOUNTER — Ambulatory Visit: Payer: Medicaid Other | Attending: Orthopedic Surgery | Admitting: Occupational Therapy

## 2013-10-06 DIAGNOSIS — M25649 Stiffness of unspecified hand, not elsewhere classified: Secondary | ICD-10-CM | POA: Insufficient documentation

## 2013-10-06 DIAGNOSIS — IMO0001 Reserved for inherently not codable concepts without codable children: Secondary | ICD-10-CM | POA: Insufficient documentation

## 2013-10-06 DIAGNOSIS — M25549 Pain in joints of unspecified hand: Secondary | ICD-10-CM | POA: Insufficient documentation

## 2013-10-25 ENCOUNTER — Ambulatory Visit: Payer: Medicaid Other | Attending: Orthopedic Surgery | Admitting: Occupational Therapy

## 2013-10-25 DIAGNOSIS — IMO0001 Reserved for inherently not codable concepts without codable children: Secondary | ICD-10-CM | POA: Insufficient documentation

## 2013-10-25 DIAGNOSIS — M25649 Stiffness of unspecified hand, not elsewhere classified: Secondary | ICD-10-CM | POA: Insufficient documentation

## 2013-10-25 DIAGNOSIS — M25549 Pain in joints of unspecified hand: Secondary | ICD-10-CM | POA: Insufficient documentation

## 2013-10-29 ENCOUNTER — Ambulatory Visit: Payer: Medicaid Other | Admitting: Occupational Therapy

## 2013-10-29 DIAGNOSIS — M25549 Pain in joints of unspecified hand: Secondary | ICD-10-CM | POA: Diagnosis not present

## 2013-10-29 DIAGNOSIS — IMO0001 Reserved for inherently not codable concepts without codable children: Secondary | ICD-10-CM | POA: Diagnosis present

## 2013-10-29 DIAGNOSIS — M25649 Stiffness of unspecified hand, not elsewhere classified: Secondary | ICD-10-CM | POA: Diagnosis not present

## 2013-11-15 ENCOUNTER — Encounter: Payer: Self-pay | Admitting: Occupational Therapy

## 2013-11-22 ENCOUNTER — Ambulatory Visit: Payer: Medicaid Other | Attending: Orthopedic Surgery | Admitting: Occupational Therapy

## 2013-11-22 DIAGNOSIS — M25649 Stiffness of unspecified hand, not elsewhere classified: Secondary | ICD-10-CM | POA: Insufficient documentation

## 2013-11-22 DIAGNOSIS — M25549 Pain in joints of unspecified hand: Secondary | ICD-10-CM | POA: Insufficient documentation

## 2013-11-22 DIAGNOSIS — IMO0001 Reserved for inherently not codable concepts without codable children: Secondary | ICD-10-CM | POA: Insufficient documentation

## 2013-12-08 ENCOUNTER — Encounter: Payer: Self-pay | Admitting: Occupational Therapy

## 2014-09-24 ENCOUNTER — Emergency Department (HOSPITAL_BASED_OUTPATIENT_CLINIC_OR_DEPARTMENT_OTHER): Payer: Medicaid Other

## 2014-09-24 ENCOUNTER — Emergency Department (HOSPITAL_BASED_OUTPATIENT_CLINIC_OR_DEPARTMENT_OTHER)
Admission: EM | Admit: 2014-09-24 | Discharge: 2014-09-24 | Disposition: A | Discharge status: Home / Self Care | Payer: Medicaid Other | Attending: Emergency Medicine | Admitting: Emergency Medicine

## 2014-09-24 DIAGNOSIS — J159 Unspecified bacterial pneumonia: Secondary | ICD-10-CM | POA: Insufficient documentation

## 2014-09-24 DIAGNOSIS — Z87440 Personal history of urinary (tract) infections: Secondary | ICD-10-CM | POA: Insufficient documentation

## 2014-09-24 DIAGNOSIS — K219 Gastro-esophageal reflux disease without esophagitis: Secondary | ICD-10-CM | POA: Insufficient documentation

## 2014-09-24 DIAGNOSIS — Z7951 Long term (current) use of inhaled steroids: Secondary | ICD-10-CM | POA: Insufficient documentation

## 2014-09-24 DIAGNOSIS — Z3202 Encounter for pregnancy test, result negative: Secondary | ICD-10-CM | POA: Diagnosis not present

## 2014-09-24 DIAGNOSIS — R05 Cough: Secondary | ICD-10-CM | POA: Diagnosis present

## 2014-09-24 DIAGNOSIS — J45909 Unspecified asthma, uncomplicated: Secondary | ICD-10-CM | POA: Diagnosis not present

## 2014-09-24 DIAGNOSIS — M1990 Unspecified osteoarthritis, unspecified site: Secondary | ICD-10-CM | POA: Insufficient documentation

## 2014-09-24 DIAGNOSIS — Z720 Tobacco use: Secondary | ICD-10-CM | POA: Diagnosis not present

## 2014-09-24 DIAGNOSIS — Z79899 Other long term (current) drug therapy: Secondary | ICD-10-CM | POA: Insufficient documentation

## 2014-09-24 DIAGNOSIS — J189 Pneumonia, unspecified organism: Secondary | ICD-10-CM

## 2014-09-24 LAB — URINALYSIS, ROUTINE W REFLEX MICROSCOPIC
Glucose, UA: NEGATIVE mg/dL
Hgb urine dipstick: NEGATIVE
Ketones, ur: NEGATIVE mg/dL
Nitrite: NEGATIVE
Protein, ur: NEGATIVE mg/dL
Specific Gravity, Urine: 1.02 (ref 1.005–1.030)
Urobilinogen, UA: 1 mg/dL (ref 0.0–1.0)
pH: 5.5 (ref 5.0–8.0)

## 2014-09-24 LAB — URINE MICROSCOPIC-ADD ON

## 2014-09-24 LAB — LIPASE, BLOOD: Lipase: 29 U/L (ref 11–59)

## 2014-09-24 LAB — COMPREHENSIVE METABOLIC PANEL
ALT: 23 U/L (ref 0–35)
AST: 42 U/L — ABNORMAL HIGH (ref 0–37)
Albumin: 3.3 g/dL — ABNORMAL LOW (ref 3.5–5.2)
Alkaline Phosphatase: 81 U/L (ref 39–117)
Anion gap: 7 (ref 5–15)
BUN: 7 mg/dL (ref 6–23)
CO2: 27 mmol/L (ref 19–32)
Calcium: 8.9 mg/dL (ref 8.4–10.5)
Chloride: 104 mmol/L (ref 96–112)
Creatinine, Ser: 0.94 mg/dL (ref 0.50–1.10)
GFR calc Af Amer: 81 mL/min — ABNORMAL LOW (ref >90)
GFR calc non Af Amer: 70 mL/min — ABNORMAL LOW (ref >90)
Glucose, Bld: 109 mg/dL — ABNORMAL HIGH (ref 70–99)
Potassium: 3.6 mmol/L (ref 3.5–5.1)
Sodium: 138 mmol/L (ref 135–145)
Total Bilirubin: 0.4 mg/dL (ref 0.3–1.2)
Total Protein: 7.9 g/dL (ref 6.0–8.3)

## 2014-09-24 LAB — PREGNANCY, URINE: Preg Test, Ur: NEGATIVE

## 2014-09-24 LAB — D-DIMER, QUANTITATIVE: D-Dimer, Quant: 1.52 ug/mL-FEU — ABNORMAL HIGH (ref 0.00–0.48)

## 2014-09-24 MED ORDER — DIPHENHYDRAMINE HCL 25 MG PO CAPS
25.0000 mg | ORAL_CAPSULE | Freq: Once | ORAL | Status: AC
  Administered 2014-09-24: 25 mg via ORAL
  Filled 2014-09-24: qty 1

## 2014-09-24 MED ORDER — LEVOFLOXACIN 750 MG PO TABS: 750.0000 mg | ORAL_TABLET | Freq: Every day | ORAL | Status: DC

## 2014-09-24 MED ORDER — OXYCODONE-ACETAMINOPHEN 5-325 MG PO TABS
2.0000 | ORAL_TABLET | Freq: Once | ORAL | Status: AC
  Administered 2014-09-24: 2 via ORAL
  Filled 2014-09-24: qty 2

## 2014-09-24 MED ORDER — IOHEXOL 350 MG/ML SOLN
100.0000 mL | Freq: Once | INTRAVENOUS | Status: AC | PRN
  Administered 2014-09-24: 100 mL via INTRAVENOUS

## 2014-09-24 NOTE — Discharge Instructions (Signed)
Patient is advised to follow up with primary care physician in 2 days for reevaluation. If you experience fever and chest pain shortness of breath nausea vomiting abdominal pain or any other concerning symptoms please return for reevaluation immediately.

## 2014-09-24 NOTE — ED Notes (Signed)
Patient here with productive cough with green sputum and bilateral ear pain x 3 days, nodistress

## 2014-09-24 NOTE — ED Provider Notes (Signed)
CSN: [id number]     Arrival date & time 09/24/14  1017 History   First MD Initiated Contact with Patient 09/24/14 1159     Chief Complaint  Patient presents with  . Cough   HPI Comments: 49 year old female presents with chronic cough and chronic right flank pain she states that the cough has been present for approximately one month for which she was seen in the emergency room in [facility].  At that time she also had urinary symptoms and was treated with Keflex at home. She persisted to have lower flank pain and returned and again placed on an antibiotic Cipro and discharged home. She had a CT of abdomen at that time which showed no infectious process. At that time she noticed a slight improvement in her cough and upper respiratory symptoms with flank pain continuing to persist with no improvement; she did not seek further evaluation. Patient describes the flank pain as persistent worse with movement pressure-like no alleviating factors. She denies fever, chest pain, nausea, vomiting, or changes in bowel or bladder habits. Patient reports only significant past medical history is asthma.  Patient is a 49 y.o. female presenting with cough.  Cough   Past Medical History  Diagnosis Date  . Reflux   . Chronic UTI   . Asthma   . GERD (gastroesophageal reflux disease)   . Arthritis   . Wears glasses    Past Surgical History  Procedure Laterality Date  . Joint replacement    . Toe amputation    . Hand surgery    . Abdominal hysterectomy    . Mouth surgery    . Umbilical hernia repair    . Bunionectomy      x 2  . Tonsillectomy    . Dilation and curettage of uterus    . Abdominal hysterectomy  1992  . Tubal ligation    . Multiple tooth extractions    . Umbilical hernia repair      age 89  . Toe amputation      right pinky toe  . Thumb arthroscopy  2012    reconstruction right thumb  . Carpometacarpel suspension plasty Left 09/20/2013    Procedure: LEFT THUMB SUSPENSION ARTHROPLASTY;   Surgeon: Jodi Marble, MD;  Location: [facility];  Service: Orthopedics;  Laterality: Left;  . Dorsal compartment release Left 09/20/2013    Procedure: LEFT THUMB DEQUERVAIN RELEASE;  Surgeon: Jodi Marble, MD;  Location: [facility];  Service: Orthopedics;  Laterality: Left;   No family history on file. History  Substance Use Topics  . Smoking status: Current Every Day Smoker -- 0.50 packs/day    Types: Cigarettes  . Smokeless tobacco: Not on file  . Alcohol Use: No   OB History    No data available     Review of Systems  Respiratory: Positive for cough.   All other systems reviewed and are negative.  Allergies  Codeine; Codeine; and Morphine and related  Home Medications   Prior to Admission medications   Medication Sig Start Date End Date Taking? Authorizing Provider  albuterol (ACCUNEB) 1.25 MG/3ML nebulizer solution Take 1 ampule by nebulization every 6 (six) hours as needed.    Historical Provider, MD  albuterol (PROVENTIL HFA;VENTOLIN HFA) 108 (90 BASE) MCG/ACT inhaler Inhale into the lungs every 6 (six) hours as needed for wheezing or shortness of breath.    Historical Provider, MD  albuterol (PROVENTIL) (2.5 MG/3ML) 0.083% nebulizer solution Take 2.5 mg  by nebulization every 6 (six) hours as needed.    Historical Provider, MD  cetirizine (ZYRTEC) 10 MG tablet Take 10 mg by mouth daily.    Historical Provider, MD  fluticasone (FLOVENT HFA) 110 MCG/ACT inhaler Inhale into the lungs 2 (two) times daily.    Historical Provider, MD  fluticasone-salmeterol (ADVAIR HFA) 115-21 MCG/ACT inhaler Inhale 2 puffs into the lungs 2 (two) times daily.    Historical Provider, MD  Fluticasone-Salmeterol (ADVAIR) 100-50 MCG/DOSE AEPB Inhale 1 puff into the lungs every 12 (twelve) hours.    Historical Provider, MD  ranitidine (ZANTAC) 150 MG capsule Take 150 mg by mouth every evening.    Historical Provider, MD   BP 133/69 mmHg  Pulse 81  Temp(Src) 98.5  F (36.9 C) (Oral)  Resp 20  Ht 5\' 9"  (1.753 m)  Wt 253 lb 14.4 oz (115.168 kg)  BMI 37.48 kg/m2  SpO2 100% Physical Exam  Constitutional: She is oriented to person, place, and time. She appears well-developed and well-nourished.  HENT:  Head: Normocephalic and atraumatic.  Eyes: Pupils are equal, round, and reactive to light.  Neck: Normal range of motion. Neck supple. No JVD present. No tracheal deviation present. No thyromegaly present.  Cardiovascular: Normal rate, regular rhythm, normal heart sounds and intact distal pulses.  Exam reveals no gallop and no friction rub.   No murmur heard. Pulmonary/Chest: Effort normal and breath sounds normal. No stridor. No respiratory distress. She has no wheezes. She has no rales. She exhibits no tenderness.  Musculoskeletal: Normal range of motion.  Lymphadenopathy:    She has no cervical adenopathy.  Neurological: She is alert and oriented to person, place, and time. Coordination normal.  Skin: Skin is warm and dry.  Psychiatric: She has a normal mood and affect. Her behavior is normal. Judgment and thought content normal.  Nursing note and vitals reviewed.   ED Course  Procedures (including critical care time) Labs Review Labs Reviewed - No data to display  Imaging Review Dg Chest 2 View  09/24/2014   CLINICAL DATA:  49 year old female with a history of cough for 1 month. Bilateral year pain.  EXAM: CHEST - 2 VIEW  COMPARISON:  02/21/2012  FINDINGS: Cardiomediastinal silhouette projects within normal limits. No evidence of pulmonary vascular congestion.  Ill-defined airspace disease at the medial right base, similar to prior plain film.  No other regions of confluent airspace disease. No pleural effusion or pneumothorax.  No displaced fracture.  Mild scoliotic curvature of the thoracic spine.  Unremarkable appearance the upper abdomen.  IMPRESSION: Ill-defined airspace disease at the medial right base, in a similar location as the prior,  may reflect recurrent pneumonia given the history.  Signed,  Yvone NeuJaime S. Loreta AveWagner, DO  [facility]  [facility]  Signed,  Yvone NeuJaime S. Loreta AveWagner, DO  [facility]  [facility]   Electronically Signed   By: Gilmer MorJaime  Wagner D.O.   On: 09/24/2014 11:22     EKG Interpretation None      MDM   Final diagnoses:  Community acquired pneumonia   Labs: urinalysis, CMP: decreased GFR 70, lipase, D-dimer  Imaging: chest x-ray  Ill-defined airspace disease at the medial right base, in a similar location as the prior, may reflect recurrent pneumonia given the History.   [facility] CT results on 09/03/2014 showed 1. There is no nephrolithiasis no hydronephrosis or hydroureter 2. No calcified ureteral calculi by calculi are noted within the urinary bladder 3. No pericecal  inflammation and normal appendix 4. Status post hysterectomy  CT chest 09/24/2014  Ill-defined airspace disease at the medial right base, in a similar location as the prior, may reflect recurrent pneumonia given the History.  Therapeutics:  Oxycodone; improved pain  Assessment and plan: The patient's persistent cough may represent recurrent pneumonia as evidenced by CT. The patient's clinical exam would support this. She was discharged home with Levaquin and instructed to take the entire course. Patient's flank pain has been worked up with CT scan, urinalysis, CMP, lipase showing no significant findings. This pain may likely be due to recurrent pneumonia. Patient is advised to monitor for new or worsening signs or symptoms and return immediately if symptoms worsen. If symptoms do not improve after antibiotic therapy she is instructed to follow-up with her primary care provider for further evaluation and management. To that and agreed to this plan. She is discharged home with instructions to use ibuprofen as needed for pain management.   Eyvonne Mechanic,  PA-C 09/27/14 1610  Derwood Kaplan, MD 09/29/14 [phone]

## 2015-05-08 ENCOUNTER — Ambulatory Visit: Payer: Medicaid Other | Admitting: Podiatry

## 2015-09-24 IMAGING — CR DG CHEST 2V
2 series · 2 of 2 positions shown · non-contrast
Comparison: 02/21/2012

CLINICAL DATA: 48-year-old female with a history of cough for 1
month. Bilateral year pain.

EXAM:
CHEST - 2 VIEW

[w chest pa]
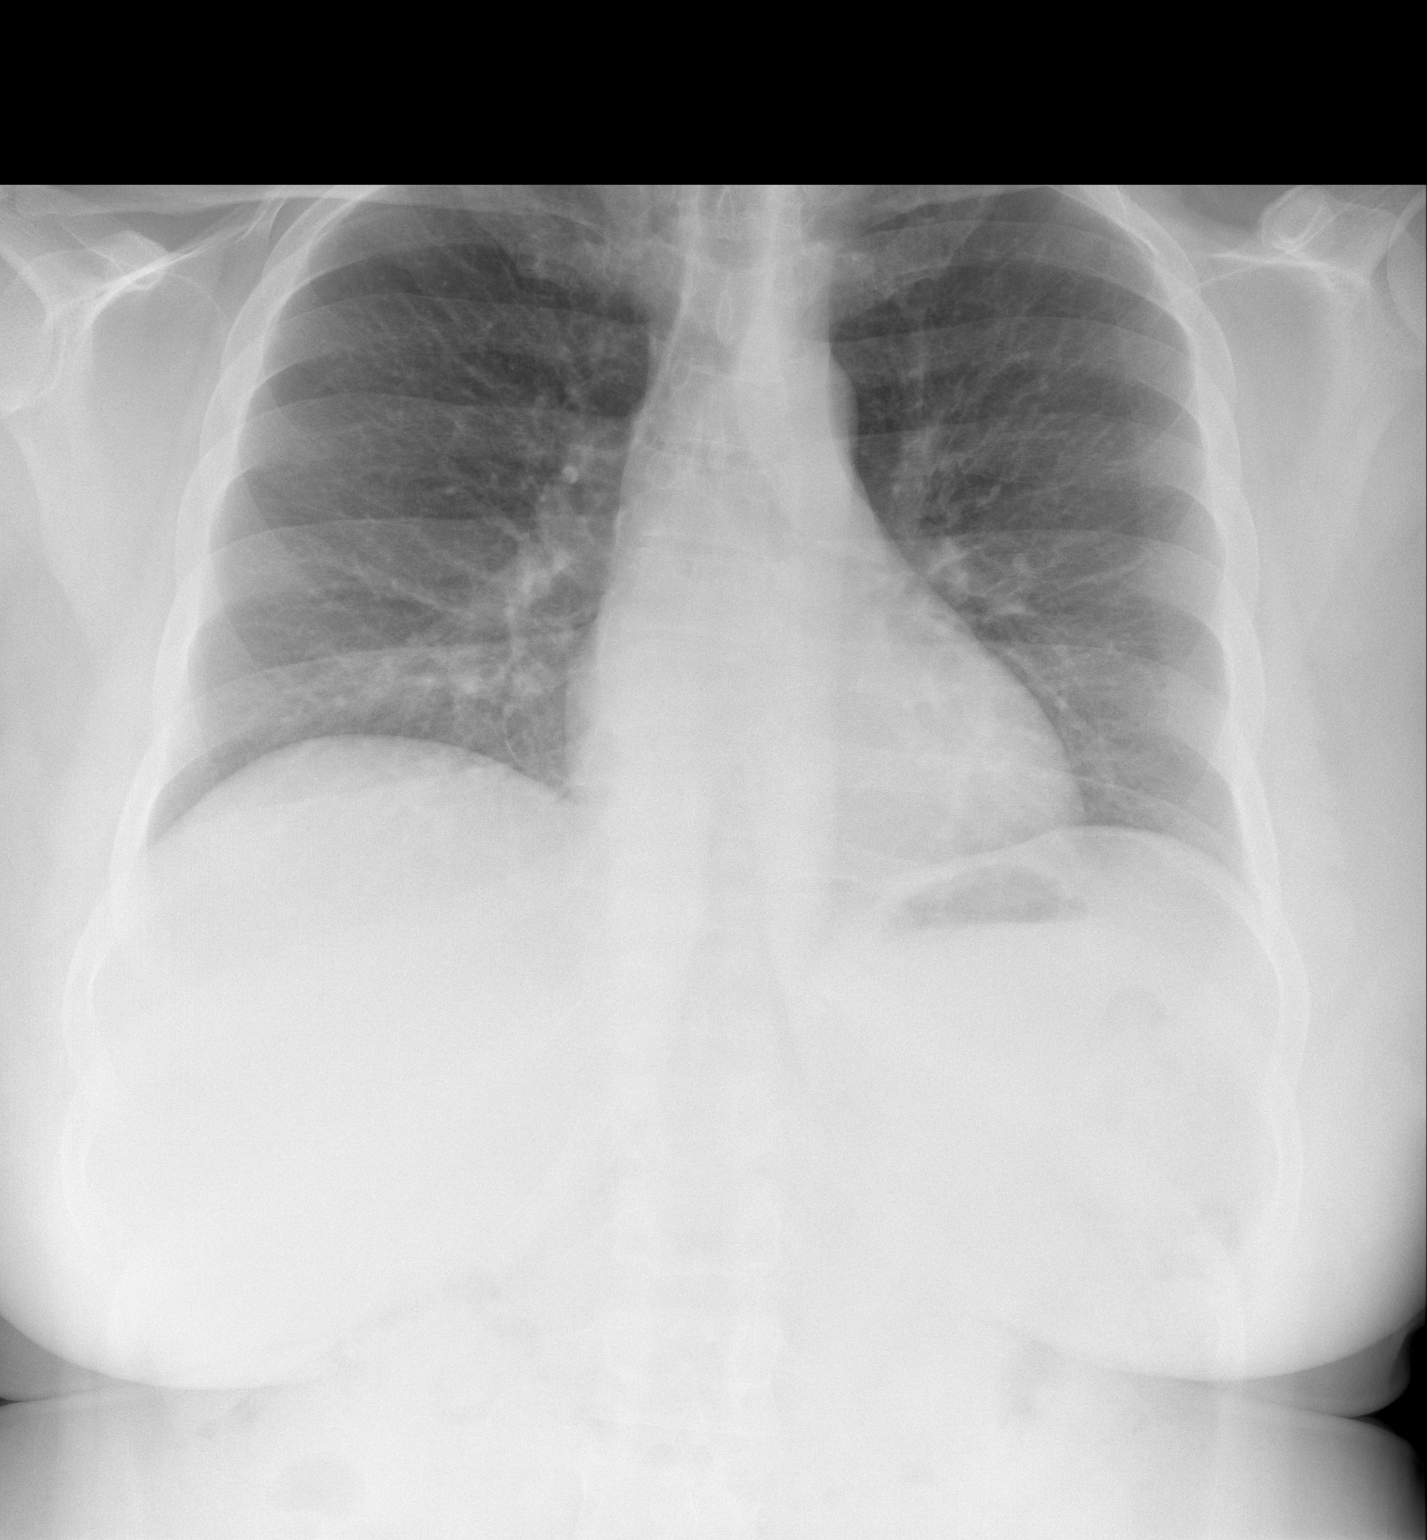

[w chest lat]
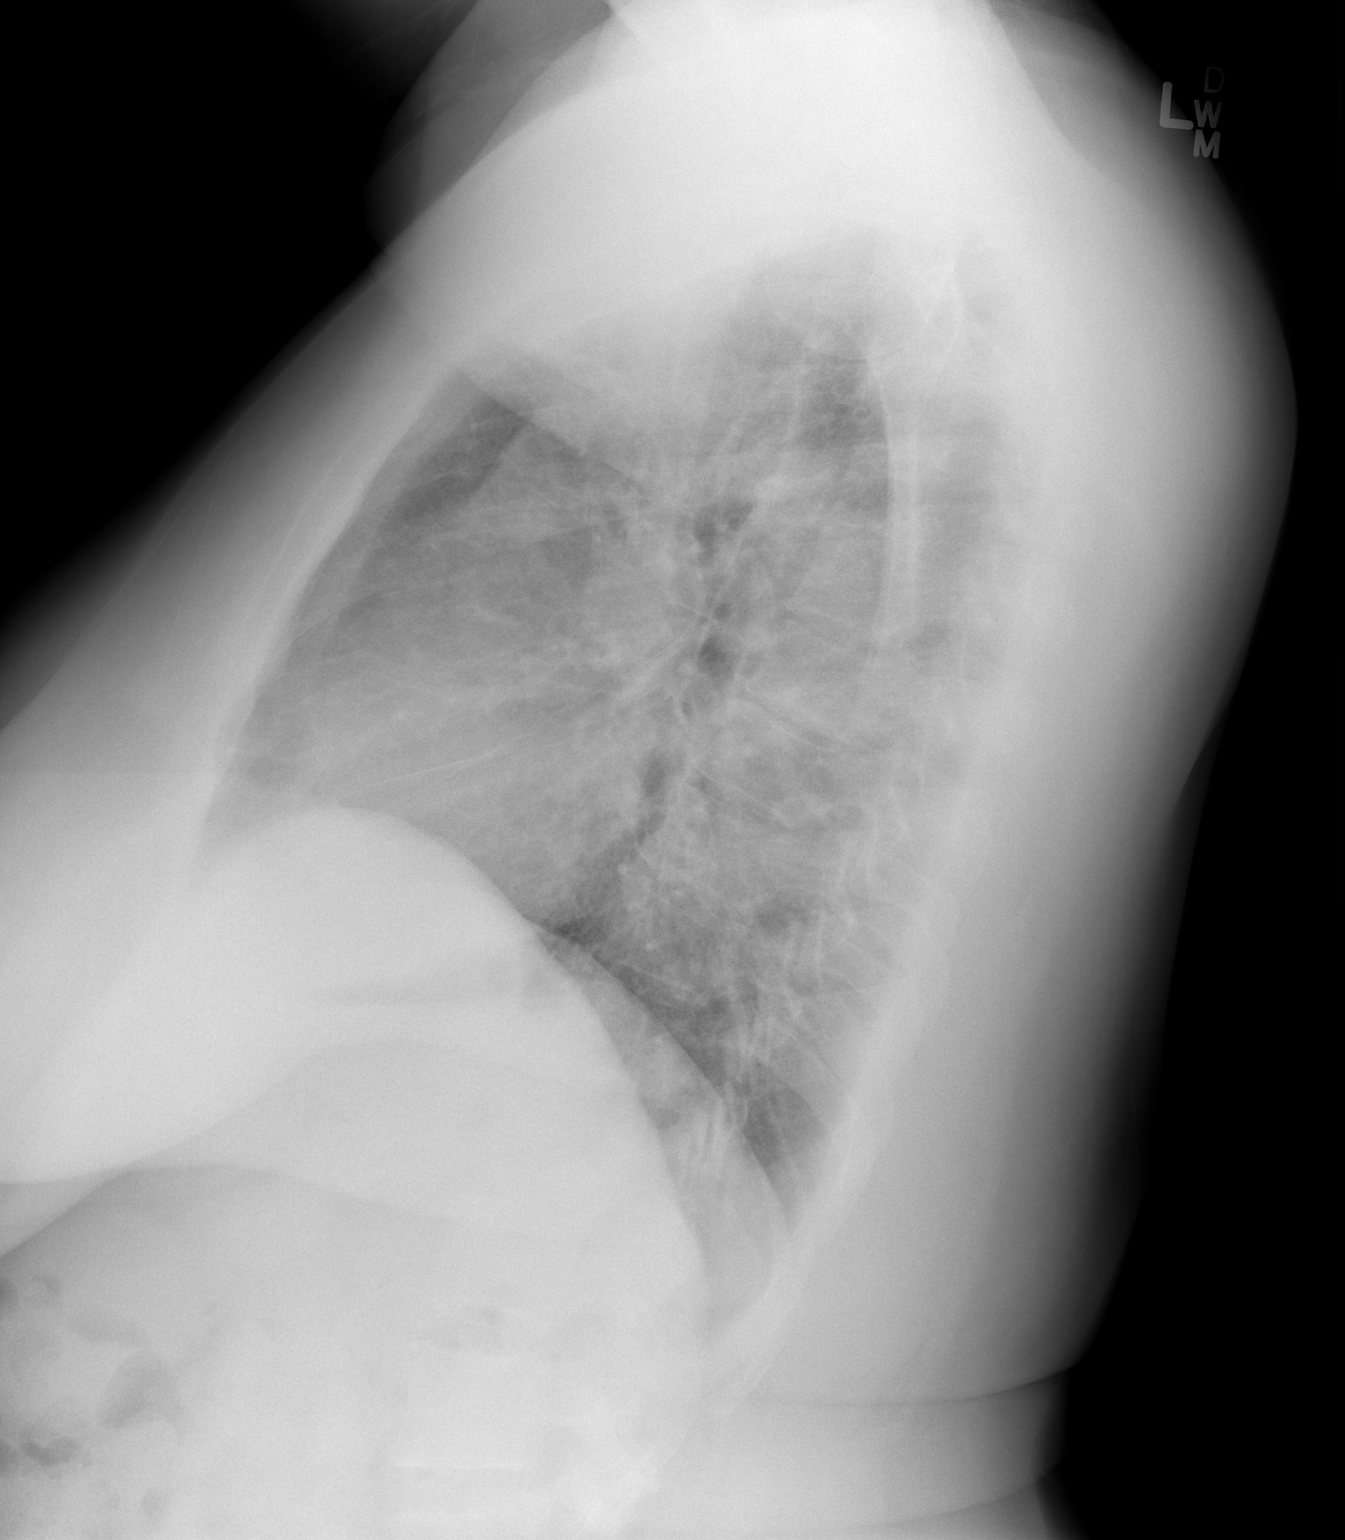

[2 of 2 positions shown; findings below may reference images not displayed]

FINDINGS: Cardiomediastinal silhouette projects within normal limits. No
evidence of pulmonary vascular congestion.

Ill-defined airspace disease at the medial right base, similar to
prior plain film.

No other regions of confluent airspace disease. No pleural effusion
or pneumothorax.

No displaced fracture.

Mild scoliotic curvature of the thoracic spine.

Unremarkable appearance the upper abdomen.
IMPRESSION: Ill-defined airspace disease at the medial right base, in a similar
location as the prior, may reflect recurrent pneumonia given the
history.

## 2015-12-25 ENCOUNTER — Other Ambulatory Visit: Payer: Self-pay | Admitting: Orthopedic Surgery

## 2015-12-26 ENCOUNTER — Encounter (HOSPITAL_BASED_OUTPATIENT_CLINIC_OR_DEPARTMENT_OTHER): Payer: Self-pay | Admitting: *Deleted

## 2015-12-28 NOTE — H&P (Signed)
Janice MayhewJeannetta Johnson is an 50 y.o. female.   CC / Reason for Visit: Bilateral finger pain with locking and catching HPI: This patient is a [age] year old female who presents for evaluation of multiple digits that are painful, locking and catching, similar to the trigger fingers that she has experienced before.  She reports it is the right index finger, and the left long and ring fingers.  Oral prednisone has made only a modest difference.  She is not particularly good with injections, and wishes instead to proceed surgically.  Past Medical History  Diagnosis Date  . Reflux   . Chronic UTI   . Asthma   . GERD (gastroesophageal reflux disease)   . Arthritis   . Wears glasses   . Anxiety     Past Surgical History  Procedure Laterality Date  . Joint replacement    . Toe amputation    . Hand surgery    . Abdominal hysterectomy    . Mouth surgery    . Umbilical hernia repair    . Bunionectomy      x 2  . Tonsillectomy    . Dilation and curettage of uterus    . Abdominal hysterectomy  1992  . Tubal ligation    . Multiple tooth extractions    . Umbilical hernia repair      age 89  . Toe amputation      right pinky toe  . Thumb arthroscopy  2012    reconstruction right thumb  . Carpometacarpel suspension plasty Left 09/20/2013    Procedure: LEFT THUMB SUSPENSION ARTHROPLASTY;  Surgeon: Jodi Marbleavid A Maysel Mccolm, MD;  Location: [facility];  Service: Orthopedics;  Laterality: Left;  . Dorsal compartment release Left 09/20/2013    Procedure: LEFT THUMB DEQUERVAIN RELEASE;  Surgeon: Jodi Marbleavid A Apryll Hinkle, MD;  Location: [facility];  Service: Orthopedics;  Laterality: Left;    History reviewed. No pertinent family history. Social History:  reports that she has been smoking Cigarettes.  She has been smoking about 0.50 packs per day. She does not have any smokeless tobacco history on file. She reports that she does not drink alcohol or use illicit drugs.  Allergies:  Allergies   Allergen Reactions  . Codeine   . Codeine Itching  . Morphine And Related   . Tape     No prescriptions prior to admission    No results found for this or any previous visit (from the past 48 hour(s)). No results found.  ROS  Height 5\' 10"  (1.778 m), weight 114.76 kg (253 lb). Physical Exam  Constitutional:  WD, WN, NAD HEENT:  NCAT, EOMI Neuro/Psych:  Alert & oriented to person, place, and time; appropriate mood & affect Lymphatic: No generalized UE edema or lymphadenopathy Extremities / MSK:  Both UE are normal with respect to appearance, ranges of motion, joint stability, muscle strength/tone, sensation, & perfusion except as otherwise noted:  Right index and left long and ring fingers are tender over A1 pulley, with characteristic locking of stenosing tenosynovitis.  Nodule on the tendon can be palpated.  Motion is diminished as she avoids fullest flexion and position of full locking  Labs / Xrays:  No radiographic studies obtained today.  Assessment: Trigger digits involving the right index, left long and left ring fingers.  Plan:  I attempted to steer her towards accepting a corticosteroid injection, but ultimately she declined.  She would like to proceed with surgical release instead and we will plan to proceed accordingly.  She has done well with regard to her other trigger digit releases.  A prescription for Percocet was provided.  The details of the operative procedure were discussed with the patient.  Questions were invited and answered.  In addition to the goal of the procedure, the risks of the procedure to include but not limited to bleeding; infection; damage to the nerves or blood vessels that could result in bleeding, numbness, weakness, chronic pain, and the need for additional procedures; stiffness; the need for revision surgery; and anesthetic risks, were reviewed.  No specific outcome was guaranteed or implied.  Informed consent was obtained.  Janice Jumper  A., MD 12/28/2015, 2:23 PM

## 2016-01-02 ENCOUNTER — Encounter (HOSPITAL_BASED_OUTPATIENT_CLINIC_OR_DEPARTMENT_OTHER): Payer: Self-pay | Admitting: Certified Registered"

## 2016-01-02 ENCOUNTER — Ambulatory Visit (HOSPITAL_BASED_OUTPATIENT_CLINIC_OR_DEPARTMENT_OTHER)
Admission: RE | Admit: 2016-01-02 | Discharge: 2016-01-02 | Disposition: A | Discharge status: Home / Self Care | Payer: Medicaid Other | Source: Ambulatory Visit | Attending: Orthopedic Surgery | Admitting: Orthopedic Surgery

## 2016-01-02 ENCOUNTER — Encounter (HOSPITAL_BASED_OUTPATIENT_CLINIC_OR_DEPARTMENT_OTHER)
Admission: RE | Disposition: A | Discharge status: Home / Self Care | Payer: Self-pay | Source: Ambulatory Visit | Attending: Orthopedic Surgery

## 2016-01-02 ENCOUNTER — Ambulatory Visit (HOSPITAL_BASED_OUTPATIENT_CLINIC_OR_DEPARTMENT_OTHER): Payer: Medicaid Other | Admitting: Certified Registered"

## 2016-01-02 DIAGNOSIS — Z89421 Acquired absence of other right toe(s): Secondary | ICD-10-CM | POA: Diagnosis not present

## 2016-01-02 DIAGNOSIS — M65332 Trigger finger, left middle finger: Secondary | ICD-10-CM | POA: Insufficient documentation

## 2016-01-02 DIAGNOSIS — M65321 Trigger finger, right index finger: Secondary | ICD-10-CM | POA: Diagnosis not present

## 2016-01-02 DIAGNOSIS — M65342 Trigger finger, left ring finger: Secondary | ICD-10-CM | POA: Diagnosis not present

## 2016-01-02 HISTORY — DX: Anxiety disorder, unspecified: F41.9

## 2016-01-02 HISTORY — PX: TRIGGER FINGER RELEASE: SHX641

## 2016-01-02 SURGERY — RELEASE, A1 PULLEY, FOR TRIGGER FINGER
Anesthesia: General | Site: Hand | Laterality: Bilateral

## 2016-01-02 MED ORDER — LACTATED RINGERS IV SOLN
INTRAVENOUS | Status: DC
  Administered 2016-01-02: 15:00:00 via INTRAVENOUS
  Administered 2016-01-02: 10 mL/h via INTRAVENOUS

## 2016-01-02 MED ORDER — GLYCOPYRROLATE 0.2 MG/ML IJ SOLN: 0.2000 mg | Freq: Once | INTRAMUSCULAR | Status: DC | PRN

## 2016-01-02 MED ORDER — BUPIVACAINE-EPINEPHRINE 0.25% -1:200000 IJ SOLN
INTRAMUSCULAR | Status: DC | PRN
  Administered 2016-01-02: 7 mL

## 2016-01-02 MED ORDER — FENTANYL CITRATE (PF) 100 MCG/2ML IJ SOLN
50.0000 ug | INTRAMUSCULAR | Status: DC | PRN
  Administered 2016-01-02: 100 ug via INTRAVENOUS

## 2016-01-02 MED ORDER — OXYCODONE-ACETAMINOPHEN 5-325 MG PO TABS: 1.0000 | ORAL_TABLET | ORAL | Status: AC | PRN

## 2016-01-02 MED ORDER — ONDANSETRON HCL 4 MG/2ML IJ SOLN
INTRAMUSCULAR | Status: AC
  Filled 2016-01-02: qty 2

## 2016-01-02 MED ORDER — LACTATED RINGERS IV SOLN: INTRAVENOUS | Status: DC

## 2016-01-02 MED ORDER — OXYCODONE HCL 5 MG PO TABS
5.0000 mg | ORAL_TABLET | Freq: Once | ORAL | Status: AC
  Administered 2016-01-02: 5 mg via ORAL

## 2016-01-02 MED ORDER — MIDAZOLAM HCL 2 MG/2ML IJ SOLN
1.0000 mg | INTRAMUSCULAR | Status: DC | PRN
  Administered 2016-01-02: 1 mg via INTRAVENOUS

## 2016-01-02 MED ORDER — FENTANYL CITRATE (PF) 100 MCG/2ML IJ SOLN
INTRAMUSCULAR | Status: AC
  Filled 2016-01-02: qty 2

## 2016-01-02 MED ORDER — LIDOCAINE HCL (CARDIAC) 20 MG/ML IV SOLN
INTRAVENOUS | Status: DC | PRN
  Administered 2016-01-02: 40 mg via INTRAVENOUS

## 2016-01-02 MED ORDER — OXYCODONE HCL 5 MG PO TABS
ORAL_TABLET | ORAL | Status: AC
  Filled 2016-01-02: qty 1

## 2016-01-02 MED ORDER — ONDANSETRON HCL 4 MG/2ML IJ SOLN: 4.0000 mg | Freq: Once | INTRAMUSCULAR | Status: DC | PRN

## 2016-01-02 MED ORDER — FENTANYL CITRATE (PF) 100 MCG/2ML IJ SOLN
25.0000 ug | INTRAMUSCULAR | Status: DC | PRN
  Administered 2016-01-02 (×2): 50 ug via INTRAVENOUS

## 2016-01-02 MED ORDER — ONDANSETRON HCL 4 MG/2ML IJ SOLN
INTRAMUSCULAR | Status: DC | PRN
  Administered 2016-01-02: 4 mg via INTRAVENOUS

## 2016-01-02 MED ORDER — LIDOCAINE HCL 2 % IJ SOLN
INTRAMUSCULAR | Status: AC
  Filled 2016-01-02: qty 20

## 2016-01-02 MED ORDER — CEFAZOLIN SODIUM-DEXTROSE 2-4 GM/100ML-% IV SOLN
2.0000 g | INTRAVENOUS | Status: AC
  Administered 2016-01-02: 2 g via INTRAVENOUS

## 2016-01-02 MED ORDER — BUPIVACAINE-EPINEPHRINE (PF) 0.25% -1:200000 IJ SOLN
INTRAMUSCULAR | Status: AC
  Filled 2016-01-02: qty 30

## 2016-01-02 MED ORDER — MIDAZOLAM HCL 2 MG/2ML IJ SOLN
INTRAMUSCULAR | Status: AC
  Filled 2016-01-02: qty 2

## 2016-01-02 MED ORDER — SCOPOLAMINE 1 MG/3DAYS TD PT72: 1.0000 | MEDICATED_PATCH | Freq: Once | TRANSDERMAL | Status: DC | PRN

## 2016-01-02 MED ORDER — LIDOCAINE 2% (20 MG/ML) 5 ML SYRINGE
INTRAMUSCULAR | Status: AC
  Filled 2016-01-02: qty 5

## 2016-01-02 MED ORDER — EPHEDRINE SULFATE 50 MG/ML IJ SOLN
INTRAMUSCULAR | Status: DC | PRN
  Administered 2016-01-02: 10 mg via INTRAVENOUS

## 2016-01-02 MED ORDER — DEXAMETHASONE SODIUM PHOSPHATE 10 MG/ML IJ SOLN
INTRAMUSCULAR | Status: DC | PRN
  Administered 2016-01-02: 10 mg via INTRAVENOUS

## 2016-01-02 MED ORDER — PROPOFOL 10 MG/ML IV BOLUS
INTRAVENOUS | Status: DC | PRN
  Administered 2016-01-02: 200 mg via INTRAVENOUS

## 2016-01-02 MED ORDER — DEXAMETHASONE SODIUM PHOSPHATE 10 MG/ML IJ SOLN
INTRAMUSCULAR | Status: AC
  Filled 2016-01-02: qty 1

## 2016-01-02 MED ORDER — CEFAZOLIN SODIUM-DEXTROSE 2-4 GM/100ML-% IV SOLN
INTRAVENOUS | Status: AC
  Filled 2016-01-02: qty 100

## 2016-01-02 SURGICAL SUPPLY — 36 items
BANDAGE GAUZE 4  KLING STR (GAUZE/BANDAGES/DRESSINGS) ×6 IMPLANT
BLADE SURG 15 STRL LF DISP TIS (BLADE) ×1 IMPLANT
BLADE SURG 15 STRL SS (BLADE) ×2
BNDG COHESIVE 1X5 TAN STRL LF (GAUZE/BANDAGES/DRESSINGS) ×6 IMPLANT
BNDG CONFORM 2 STRL LF (GAUZE/BANDAGES/DRESSINGS) ×3 IMPLANT
BNDG ESMARK 4X9 LF (GAUZE/BANDAGES/DRESSINGS) ×3 IMPLANT
CHLORAPREP W/TINT 26ML (MISCELLANEOUS) ×6 IMPLANT
COVER BACK TABLE 60X90IN (DRAPES) ×3 IMPLANT
COVER MAYO STAND STRL (DRAPES) ×6 IMPLANT
CUFF TOURNIQUET SINGLE 18IN (TOURNIQUET CUFF) ×3 IMPLANT
DRAPE EXTREMITY T 121X128X90 (DRAPE) ×6 IMPLANT
DRAPE SURG 17X23 STRL (DRAPES) ×3 IMPLANT
DRSG EMULSION OIL 3X3 NADH (GAUZE/BANDAGES/DRESSINGS) ×6 IMPLANT
GLOVE BIO SURGEON STRL SZ7.5 (GLOVE) ×3 IMPLANT
GLOVE BIOGEL PI IND STRL 7.0 (GLOVE) ×1 IMPLANT
GLOVE BIOGEL PI IND STRL 8 (GLOVE) ×1 IMPLANT
GLOVE BIOGEL PI INDICATOR 7.0 (GLOVE) ×2
GLOVE BIOGEL PI INDICATOR 8 (GLOVE) ×2
GLOVE ECLIPSE 6.5 STRL STRAW (GLOVE) ×3 IMPLANT
GOWN STRL REUS W/ TWL LRG LVL3 (GOWN DISPOSABLE) ×2 IMPLANT
GOWN STRL REUS W/TWL LRG LVL3 (GOWN DISPOSABLE) ×4
GOWN STRL REUS W/TWL XL LVL3 (GOWN DISPOSABLE) ×3 IMPLANT
NDL SAFETY ECLIPSE 18X1.5 (NEEDLE) IMPLANT
NEEDLE HYPO 18GX1.5 SHARP (NEEDLE)
NEEDLE HYPO 25X1 1.5 SAFETY (NEEDLE) ×3 IMPLANT
NS IRRIG 1000ML POUR BTL (IV SOLUTION) ×3 IMPLANT
PACK BASIN DAY SURGERY FS (CUSTOM PROCEDURE TRAY) ×3 IMPLANT
SPONGE GAUZE 4X4 12PLY STER LF (GAUZE/BANDAGES/DRESSINGS) ×6 IMPLANT
STOCKINETTE 6  STRL (DRAPES) ×4
STOCKINETTE 6 STRL (DRAPES) ×2 IMPLANT
SUT VICRYL RAPIDE 4/0 PS 2 (SUTURE) ×6 IMPLANT
SYR BULB 3OZ (MISCELLANEOUS) ×3 IMPLANT
SYRINGE 10CC LL (SYRINGE) ×3 IMPLANT
TOWEL OR 17X24 6PK STRL BLUE (TOWEL DISPOSABLE) ×3 IMPLANT
TOWEL OR NON WOVEN STRL DISP B (DISPOSABLE) IMPLANT
UNDERPAD 30X30 (UNDERPADS AND DIAPERS) ×3 IMPLANT

## 2016-01-02 NOTE — Discharge Instructions (Signed)
Discharge Instructions   You have a light dressing on your hand.  You may begin gentle motion of your fingers and hand immediately, but you should not do any heavy lifting or gripping.  Elevate your hand to reduce pain & swelling of the digits.  Ice over the operative site may be helpful to reduce pain & swelling.  DO NOT USE HEAT. Pain medicine has been prescribed for you.  Use your medicine as needed over the first 48 hours, and then you can begin to taper your use. You may use Tylenol in place of your prescribed pain medication, but not IN ADDITION to it. Leave the dressing in place until the third day after your surgery and then remove it, leaving it open to air.  After the bandage has been removed you may shower, regularly washing the incision and letting the water run over it, but not submerging it (no swimming, soaking it in dishwater, etc.) You may drive a car when you are off of prescription pain medications and can safely control your vehicle with both hands. We will address whether therapy will be required or not when you return to the office. You may have already made your follow-up appointment when we completed your preop visit.  If not, please call our office today or the next business day to make your return appointment for 10-15 days after surgery.   Please call [phone] during normal business hours or [phone] after hours for any problems. Including the following:  - excessive redness of the incisions - drainage for more than 4 days - fever of more than 101.5 F  *Please note that pain medications wil Post Anesthesia Home Care Instructions  Activity: Get plenty of rest for the remainder of the day. A responsible adult should stay with you for 24 hours following the procedure.  For the next 24 hours, DO NOT: -Drive a car -Advertising copywriterperate machinery -Drink alcoholic beverages -Take any medication unless instructed by your physician -Make any legal decisions or sign  important papers.  Meals: Start with liquid foods such as gelatin or soup. Progress to regular foods as tolerated. Avoid greasy, spicy, heavy foods. If nausea and/or vomiting occur, drink only clear liquids until the nausea and/or vomiting subsides. Call your physician if vomiting continues.  Special Instructions/Symptoms: Your throat may feel dry or sore from the anesthesia or the breathing tube placed in your throat during surgery. If this causes discomfort, gargle with warm salt water. The discomfort should disappear within 24 hours.  If you had a scopolamine patch placed behind your ear for the management of post- operative nausea and/or vomiting:  1. The medication in the patch is effective for 72 hours, after which it should be removed.  Wrap patch in a tissue and discard in the trash. Wash hands thoroughly with soap and water. 2. You may remove the patch earlier than 72 hours if you experience unpleasant side effects which may include dry mouth, dizziness or visual disturbances. 3. Avoid touching the patch. Wash your hands with soap and water after contact with the patch.   l not be refilled after hours or on weekends.

## 2016-01-02 NOTE — Op Note (Signed)
01/02/2016  1:47 PM  PATIENT:  Janice Johnson  50 y.o. female  PRE-OPERATIVE DIAGNOSIS:  R IF, L LF & L RF trigger digits  POST-OPERATIVE DIAGNOSIS:  Same  PROCEDURE:  Release of R IF, L LF & L RF trigger digits  SURGEON: Cliffton Astersavid A. Janee Mornhompson, MD  PHYSICIAN ASSISTANT: Danielle RankinKirsten Schrader, OPA-C  ANESTHESIA:  General  SPECIMENS:  None  DRAINS:   None  EBL:  less than 50 mL  PREOPERATIVE INDICATIONS:  Janice MayhewJeannetta Johnson is a  [age] y.o. female with bilateral trigger digits.  The risks benefits and alternatives were discussed with the patient preoperatively including but not limited to the risks of infection, bleeding, nerve injury, cardiopulmonary complications, the need for revision surgery, among others, and the patient verbalized understanding and consented to proceed.  OPERATIVE IMPLANTS: none  OPERATIVE PROCEDURE:  After receiving prophylactic antibiotics, the patient was escorted to the operative theatre and placed in a supine position.  General anesthesia was administered. A surgical "time-out" was performed during which the planned procedure, proposed operative site, and the correct patient identity were compared to the operative consent and agreement confirmed by the circulating nurse according to current facility policy.  Bilaterally, the incisions were marked and anesthetized with Marcaine bearing epinephrine.  Following application of a pneumatic tourniquet to the left upper extremity, the exposed skin was prepped with Chloraprep and draped in the usual sterile fashion.  The left upper extremity was exsanguinated with an Esmarch bandage and the tourniquet inflated to approximately 100mmHg higher than systolic BP.  The incisions were made over the A1 pulleys of the left long and ring fingers obliquely.  Beginning with the long finger, spreading dissection was carried down the midline of the rate to the A1 pulley.  Neurovascular bundles were retracted radially and ulnarly.  The A1  pulley was incised down the midline.  There was some thickened tenosynovium on the tendons that were debrided excisionally.  Attention was shifted to the ring finger when identical procedure was performed.  The wounds were then irrigated and the tourniquet released.  The skin was closed with 4-0 Vicryl Rapide interrupted sutures.  Attention was then shifted to the right side, where the distal aspect of the limb was exsanguinated with an Esmarch, left in place around the distal forearm as a tourniquet.  An identical procedure as the one described above was performed for the right index finger, with care to protect and preserve the neurovascular bundles, in particular the radial digital nerve to the index.  The Esmarch tourniquet was taken down, the wound irrigated and the skin closed with 4-0 Vicryl Rapide interrupted sutures.  Both sides were then dressed with a light dressing and she was taken to the recovery room in stable condition  DISPOSITION: She'll be discharged home today with typical instructions, returning in 10-15 days.

## 2016-01-02 NOTE — Transfer of Care (Signed)
Immediate Anesthesia Transfer of Care Note  Patient: Janice MayhewJeannetta Johnson  Procedure(s) Performed: Procedure(s) with comments: RELEASE OF RIGHT INDEX, LEFT LONG AND RING TRIGGER DIGITS (Bilateral) - RELEASE OF RIGHT INDEX, LEFT LONG AND RING TRIGGER DIGITS  Patient Location: PACU  Anesthesia Type:General  Level of Consciousness: awake, alert  and oriented  Airway & Oxygen Therapy: Patient Spontanous Breathing and Patient connected to face mask oxygen  Post-op Assessment: Report given to RN, Post -op Vital signs reviewed and stable and Patient moving all extremities  Post vital signs: Reviewed and stable  Last Vitals:  Filed Vitals:   01/02/16 1250  BP: 107/69  Pulse: 75  Temp: 36.8 C  Resp: 20    Last Pain:  Filed Vitals:   01/02/16 1251  PainSc: 10-Worst pain ever         Complications: No apparent anesthesia complications

## 2016-01-02 NOTE — Interval H&P Note (Signed)
History and Physical Interval Note:  01/02/2016 1:47 PM  Janice MayhewJeannetta Durand  has presented today for surgery, with the diagnosis of RIGHT INDEX, LEFT LONG AND RING TRIGGER DIGITS M65.321, M65.332, M65.342  The various methods of treatment have been discussed with the patient and family. After consideration of risks, benefits and other options for treatment, the patient has consented to  Procedure(s): RELEASE OF RIGHT INDEX, LEFT LONG AND RING TRIGGER DIGITS (Bilateral) as a surgical intervention .  The patient's history has been reviewed, patient examined, no change in status, stable for surgery.  I have reviewed the patient's chart and labs.  Questions were answered to the patient's satisfaction.     Fabiha Rougeau A.

## 2016-01-02 NOTE — Anesthesia Preprocedure Evaluation (Signed)
Anesthesia Evaluation  Patient identified by MRN, date of birth, ID band Patient awake    Reviewed: Allergy & Precautions, NPO status , Patient's Chart, lab work & pertinent test results  Airway Mallampati: II  TM Distance: >3 FB Neck ROM: Full    Dental  (+) Edentulous Upper, Edentulous Lower   Pulmonary Current Smoker,    breath sounds clear to auscultation       Cardiovascular  Rhythm:Regular Rate:Normal     Neuro/Psych    GI/Hepatic   Endo/Other    Renal/GU      Musculoskeletal   Abdominal   Peds  Hematology   Anesthesia Other Findings   Reproductive/Obstetrics                             Anesthesia Physical Anesthesia Plan  ASA: II  Anesthesia Plan: General   Post-op Pain Management:    Induction: Intravenous  Airway Management Planned: LMA  Additional Equipment:   Intra-op Plan:   Post-operative Plan:   Informed Consent: I have reviewed the patients History and Physical, chart, labs and discussed the procedure including the risks, benefits and alternatives for the proposed anesthesia with the patient or authorized representative who has indicated his/her understanding and acceptance.     Plan Discussed with: CRNA and Anesthesiologist  Anesthesia Plan Comments:         Anesthesia Quick Evaluation

## 2016-01-02 NOTE — Anesthesia Procedure Notes (Signed)
Procedure Name: LMA Insertion Date/Time: 01/02/2016 2:02 PM Performed by: Curly ShoresRAFT, Gazella Anglin W Pre-anesthesia Checklist: Patient identified, Emergency Drugs available, Suction available and Patient being monitored Patient Re-evaluated:Patient Re-evaluated prior to inductionOxygen Delivery Method: Circle system utilized Preoxygenation: Pre-oxygenation with 100% oxygen Intubation Type: IV induction Ventilation: Mask ventilation without difficulty LMA: LMA inserted LMA Size: 4.0 Number of attempts: 1 Airway Equipment and Method: Bite block Placement Confirmation: positive ETCO2 and breath sounds checked- equal and bilateral Tube secured with: Tape Dental Injury: Teeth and Oropharynx as per pre-operative assessment

## 2016-01-03 ENCOUNTER — Encounter (HOSPITAL_BASED_OUTPATIENT_CLINIC_OR_DEPARTMENT_OTHER): Payer: Self-pay | Admitting: Orthopedic Surgery

## 2016-01-03 NOTE — Anesthesia Postprocedure Evaluation (Signed)
Anesthesia Post Note  Patient: Patent attorneyJeannetta Lando  Procedure(s) Performed: Procedure(s) (LRB): RELEASE OF RIGHT INDEX, LEFT LONG AND RING TRIGGER DIGITS (Bilateral)  Patient location during evaluation: PACU Anesthesia Type: General Level of consciousness: awake, awake and alert and oriented Pain management: pain level controlled Vital Signs Assessment: post-procedure vital signs reviewed and stable Respiratory status: spontaneous breathing, nonlabored ventilation and respiratory function stable Cardiovascular status: blood pressure returned to baseline Anesthetic complications: no    Last Vitals:  Filed Vitals:   01/02/16 1500 01/02/16 1537  BP: 113/50 142/80  Pulse: 75 62  Temp:  36.5 C  Resp: 24 18    Last Pain:  Filed Vitals:   01/02/16 1539  PainSc: 6                  Castulo Scarpelli COKER

## 2016-03-13 ENCOUNTER — Emergency Department (HOSPITAL_COMMUNITY)
Admission: EM | Admit: 2016-03-13 | Discharge: 2016-03-13 | Disposition: A | Discharge status: Home / Self Care | Payer: Medicaid Other | Attending: Emergency Medicine | Admitting: Emergency Medicine

## 2016-03-13 ENCOUNTER — Encounter (HOSPITAL_COMMUNITY): Payer: Self-pay

## 2016-03-13 DIAGNOSIS — Z79899 Other long term (current) drug therapy: Secondary | ICD-10-CM | POA: Insufficient documentation

## 2016-03-13 DIAGNOSIS — J45909 Unspecified asthma, uncomplicated: Secondary | ICD-10-CM | POA: Insufficient documentation

## 2016-03-13 DIAGNOSIS — M545 Low back pain: Secondary | ICD-10-CM | POA: Diagnosis present

## 2016-03-13 DIAGNOSIS — F17210 Nicotine dependence, cigarettes, uncomplicated: Secondary | ICD-10-CM | POA: Diagnosis not present

## 2016-03-13 DIAGNOSIS — M5442 Lumbago with sciatica, left side: Secondary | ICD-10-CM | POA: Insufficient documentation

## 2016-03-13 MED ORDER — NAPROXEN 500 MG PO TABS: 500.0000 mg | ORAL_TABLET | Freq: Two times a day (BID) | ORAL | 0 refills | Status: AC

## 2016-03-13 MED ORDER — LIDOCAINE 5 % EX PTCH: 1.0000 | MEDICATED_PATCH | CUTANEOUS | 0 refills | Status: AC

## 2016-03-13 MED ORDER — PREDNISONE 10 MG PO TABS: 20.0000 mg | ORAL_TABLET | Freq: Two times a day (BID) | ORAL | 0 refills | Status: AC

## 2016-03-13 MED ORDER — METHOCARBAMOL 500 MG PO TABS: 500.0000 mg | ORAL_TABLET | Freq: Two times a day (BID) | ORAL | 0 refills | Status: AC

## 2016-03-13 NOTE — ED Triage Notes (Signed)
Per pT, Pt has degenerative disc disease. Pt reports having worsening back pain in the last few days. Lower back pain that radiates to the hips. Some numbness to the left leg. Denies any tingling or incontinence.

## 2016-03-13 NOTE — ED Provider Notes (Signed)
[facility]-EMER[facility]ENCY DEPT Provider Note   CSN: [id number] Arrival date & time: 03/13/16  1102  By signing my name below, I, Modena JanskyAlbert Thayil, attest that this documentation has been prepared under the direction and in the presence of non-physician practitioner, Harolyn RutherfordShawn Jessic Standifer, PA-C. Electronically Signed: Modena JanskyAlbert Thayil, Scribe. 03/13/2016. 11:13 AM.  History   Chief Complaint Chief Complaint  Patient presents with  . Back Pain   The history is provided by the patient. No language interpreter was used.   HPI Comments: Janice Johnson is a 50 y.o. female with a hx of DDD who presents to the Emergency Department complaining of constant moderate bilateral lower back pain that started a few days ago. She states that her worsening pain radiates into her hips. She denies any known injury, neuro deficits, bowel/bladder incontinence, or any other complaints.   Past Medical History:  Diagnosis Date  . Anxiety   . Arthritis   . Asthma   . Chronic UTI   . GERD (gastroesophageal reflux disease)   . Reflux   . Wears glasses     There are no active problems to display for this patient.   Past Surgical History:  Procedure Laterality Date  . ABDOMINAL HYSTERECTOMY    . ABDOMINAL HYSTERECTOMY  1992  . BUNIONECTOMY     x 2  . CARPOMETACARPEL SUSPENSION PLASTY Left 09/20/2013   Procedure: LEFT THUMB SUSPENSION ARTHROPLASTY;  Surgeon: Jodi Marbleavid A Thompson, MD;  Location: [facility];  Service: Orthopedics;  Laterality: Left;  . DILATION AND CURETTAGE OF UTERUS    . DORSAL COMPARTMENT RELEASE Left 09/20/2013   Procedure: LEFT THUMB DEQUERVAIN RELEASE;  Surgeon: Jodi Marbleavid A Thompson, MD;  Location: [facility];  Service: Orthopedics;  Laterality: Left;  . HAND SURGERY    . JOINT REPLACEMENT    . MOUTH SURGERY    . MULTIPLE TOOTH EXTRACTIONS    . THUMB ARTHROSCOPY  2012   reconstruction right thumb  . TOE AMPUTATION    . TOE AMPUTATION     right pinky toe  . TONSILLECTOMY    . TRIGGER  FINGER RELEASE Bilateral 01/02/2016   Procedure: RELEASE OF RIGHT INDEX, LEFT LONG AND RING TRIGGER DIGITS;  Surgeon: Mack Hookavid Thompson, MD;  Location: [facility];  Service: Orthopedics;  Laterality: Bilateral;  RELEASE OF RIGHT INDEX, LEFT LONG AND RING TRIGGER DIGITS  . TRIGGER FINGER RELEASE    . TUBAL LIGATION    . UMBILICAL HERNIA REPAIR    . UMBILICAL HERNIA REPAIR     age 89    OB History    No data available       Home Medications    Prior to Admission medications   Medication Sig Start Date End Date Taking? Authorizing Provider  albuterol (ACCUNEB) 1.25 MG/3ML nebulizer solution Take 1 ampule by nebulization every 6 (six) hours as needed.    Historical Provider, MD  albuterol (PROVENTIL HFA;VENTOLIN HFA) 108 (90 BASE) MCG/ACT inhaler Inhale into the lungs every 6 (six) hours as needed for wheezing or shortness of breath.    Historical Provider, MD  fluticasone (FLOVENT HFA) 110 MCG/ACT inhaler Inhale into the lungs 2 (two) times daily.    Historical Provider, MD  fluticasone-salmeterol (ADVAIR HFA) 115-21 MCG/ACT inhaler Inhale 2 puffs into the lungs 2 (two) times daily.    Historical Provider, MD  lidocaine (LIDODERM) 5 % Place 1 patch onto the skin daily. Remove & Discard patch within 12 hours or as directed by MD 03/13/16  Manley Fason C Kroy Sprung, PA-C  methocarbamol (ROBAXIN) 500 MG tablet Take 1 tablet (500 mg total) by mouth 2 (two) times daily. 03/13/16   Arpan Eskelson C Sigmond Patalano, PA-C  naproxen (NAPROSYN) 500 MG tablet Take 1 tablet (500 mg total) by mouth 2 (two) times daily. 03/13/16   Shayanna Thatch C Seleni Meller, PA-C  oxyCODONE-acetaminophen (ROXICET) 5-325 MG tablet Take 1-2 tablets by mouth every 4 (four) hours as needed. 01/02/16   Mack Hook, MD  predniSONE (DELTASONE) 10 MG tablet Take 2 tablets (20 mg total) by mouth 2 (two) times daily with a meal. 03/13/16   Terilynn Buresh C Vianca Bracher, PA-C    Family History No family history on file.  Social History Social History  Substance Use Topics  . Smoking  status: Current Every Day Smoker    Packs/day: 0.50    Types: Cigarettes  . Smokeless tobacco: Never Used  . Alcohol use Yes     Comment: Occasionally      Allergies   Codeine; Codeine; Morphine and related; and Tape   Review of Systems Review of Systems  Musculoskeletal: Positive for back pain.  Neurological: Negative for weakness and numbness.  All other systems reviewed and are negative.    Physical Exam Updated Vital Signs Pulse 78   Temp 98.2 F (36.8 C) (Oral)   Resp 18   Ht 5\' 10"  (1.778 m)   Wt 249 lb (112.9 kg)   SpO2 98%   BMI 35.73 kg/m   Physical Exam  Constitutional: She appears well-developed and well-nourished. No distress.  HENT:  Head: Normocephalic and atraumatic.  Eyes: Conjunctivae are normal.  Neck: Neck supple.  Cardiovascular: Normal rate, regular rhythm and intact distal pulses.   Pulmonary/Chest: Effort normal. No respiratory distress.  Abdominal: There is no guarding.  Musculoskeletal: She exhibits no edema or tenderness.  TTP to lumbar musculature bilaterally, worse on the left. Full ROM in both lower extremities and spine. No midline spinal tenderness.   Neurological: She is alert. She has normal reflexes.  No sensory deficits. Strength 5/5 in all extremities. No gait disturbance. Coordination intact.  Skin: Skin is warm and dry. She is not diaphoretic.  Psychiatric: She has a normal mood and affect. Her behavior is normal.  Nursing note and vitals reviewed.    ED Treatments / Results  DIAGNOSTIC STUDIES: Oxygen Saturation is 98% on RA, normal by my interpretation.    COORDINATION OF CARE: 11:56 AM- Pt advised of plan for treatment and pt agrees.  Labs (all labs ordered are listed, but only abnormal results are displayed) Labs Reviewed - No data to display  EKG  EKG Interpretation None       Radiology No results found.  Procedures Procedures (including critical care time)  Medications Ordered in ED Medications -  No data to display   Initial Impression / Assessment and Plan / ED Course  I have reviewed the triage vital signs and the nursing notes.  Pertinent labs & imaging results that were available during my care of the patient were reviewed by me and considered in my medical decision making (see chart for details).  Clinical Course    Janice Johnson presents with acute on chronic lower back pain.  Patient with no neuro or functional deficits. No symptoms to suggest cauda equina. The patient was given instructions for home care as well as return precautions. Patient voices understanding of these instructions, accepts the plan, and is comfortable with discharge.  Final Clinical Impressions(s) / ED Diagnoses   Final diagnoses:  Bilateral low back pain with left-sided sciatica    New Prescriptions Discharge Medication List as of 03/13/2016 11:19 AM    START taking these medications   Details  lidocaine (LIDODERM) 5 % Place 1 patch onto the skin daily. Remove & Discard patch within 12 hours or as directed by MD, Starting Wed 03/13/2016, Print    methocarbamol (ROBAXIN) 500 MG tablet Take 1 tablet (500 mg total) by mouth 2 (two) times daily., Starting Wed 03/13/2016, Print    naproxen (NAPROSYN) 500 MG tablet Take 1 tablet (500 mg total) by mouth 2 (two) times daily., Starting Wed 03/13/2016, Print    predniSONE (DELTASONE) 10 MG tablet Take 2 tablets (20 mg total) by mouth 2 (two) times daily with a meal., Starting Wed 03/13/2016, Print       I personally performed the services described in this documentation, which was scribed in my presence. The recorded information has been reviewed and is accurate.    Anselm Pancoast, PA-C 03/13/16 1156    Geoffery Lyons, MD 03/13/16 [phone]

## 2016-03-13 NOTE — ED Notes (Signed)
Pt is in stable condition upon d/c and ambulates from ED. 

## 2016-03-13 NOTE — Discharge Instructions (Signed)
Take it easy, but do not lay around too much as this may make the stiffness worse. Take 500 mg of naproxen every 12 hours or 800 mg of ibuprofen every 8 hours for the next 3 days. Take these medications with food to avoid upset stomach. Robaxin is a muscle relaxer and may help loosen stiff muscles. Do not take the Robaxin while driving or performing other dangerous activities. °

## 2017-01-28 ENCOUNTER — Ambulatory Visit
Admission: RE | Admit: 2017-01-28 | Discharge: 2017-01-28 | Disposition: A | Discharge status: Home / Self Care | Payer: MEDICAID | Attending: Orthopaedic Surgery | Admitting: Orthopaedic Surgery

## 2017-01-28 ENCOUNTER — Ambulatory Visit
Admission: RE | Admit: 2017-01-28 | Discharge: 2017-01-28 | Disposition: A | Discharge status: Home / Self Care | Payer: MEDICAID

## 2017-01-28 DIAGNOSIS — Z96652 Presence of left artificial knee joint: Secondary | ICD-10-CM

## 2017-01-28 DIAGNOSIS — Z471 Aftercare following joint replacement surgery: Principal | ICD-10-CM

## 2017-01-28 DIAGNOSIS — B182 Chronic viral hepatitis C: Principal | ICD-10-CM

## 2017-02-03 ENCOUNTER — Ambulatory Visit
Admission: RE | Admit: 2017-02-03 | Discharge: 2017-02-03 | Disposition: A | Discharge status: Home / Self Care | Payer: MEDICAID | Attending: Gastroenterology | Admitting: Gastroenterology

## 2017-02-03 DIAGNOSIS — B182 Chronic viral hepatitis C: Principal | ICD-10-CM

## 2017-08-18 ENCOUNTER — Ambulatory Visit: Admit: 2017-08-18 | Discharge: 2017-08-19 | Payer: MEDICAID

## 2017-08-18 DIAGNOSIS — L732 Hidradenitis suppurativa: Principal | ICD-10-CM

## 2017-08-18 DIAGNOSIS — B379 Candidiasis, unspecified: Secondary | ICD-10-CM

## 2017-08-18 DIAGNOSIS — T3695XA Adverse effect of unspecified systemic antibiotic, initial encounter: Secondary | ICD-10-CM

## 2017-08-18 DIAGNOSIS — R52 Pain, unspecified: Secondary | ICD-10-CM

## 2017-08-18 MED ORDER — HYDROCODONE 5 MG-ACETAMINOPHEN 325 MG TABLET
ORAL_TABLET | Freq: Four times a day (QID) | ORAL | 0 refills | 0 days | Status: CP
Start: 2017-08-18 — End: 2018-05-06

## 2017-08-18 MED ORDER — CLINDAMYCIN HCL 300 MG CAPSULE
ORAL_CAPSULE | 2 refills | 0 days | Status: CP
Start: 2017-08-18 — End: 2017-10-16

## 2017-08-18 MED ORDER — RIFAMPIN 300 MG CAPSULE
ORAL_CAPSULE | 2 refills | 0 days | Status: CP
Start: 2017-08-18 — End: 2017-10-16

## 2017-08-18 MED ORDER — FLUCONAZOLE 100 MG TABLET
ORAL_TABLET | Freq: Once | ORAL | 6 refills | 0 days | Status: CP
Start: 2017-08-18 — End: 2017-08-18

## 2017-08-19 NOTE — Unmapped (Addendum)
ASSESSMENT/PLAN:    Hidradenitis Suppurativa; severe Hurley 3 with physical limitations      1. We discussed the typical natural history, pathogenesis, treatment options, and expected course as well as the relapsing and sometimes recalcitrant nature of the disease.      2. Start clindamycin 300mg  po bid and rifampin 300mg  po bid.  Discussed risk of GI upset, orange coloration of body fluids, hepatotoxicity, and myelosuppression.    3.  Given severity of disease, she would most benefit from long term control with TNFi we discussed this today.  She has a history of treated hepatitis C (glecaprevir/pibrentas course early 2018 with undetectable viral load since then).  Will check other baseline screening labs today.     Pending labs, Start Humira 160mg  day 1, then 80mg  day 15, then 40mg  Elizabeth Browning qweek starting day 29 for treatment of hidradenitis.  Discussed risks of tuberculosis, other uncommon infections, theoretical risk of malignancies, and other uncommon side effects.    4.  She has met with a surgeon locally (Dr. Meredith Mody) who has discussed surgical treatment of the disease in her bilateral axillia after better medical control is established.  Discussed limited data that HS patients have better surgical outcomes without increased adverse events if biological are continued without interruption pre and post op if her surgeon is amenable to this.      Acute pain  -     HYDROcodone-acetaminophen (NORCO) 5-325 mg per tablet; Take 1 tablet by mouth Every six (6) hours.  -Discussed that we cannot provide long-term prescriptions for opioids for control of pain, although it is reasonable short-term for her hidradenitis flare.  Offered referral to pain management however she declines at this point.  Could also consider trial of gabapentin.  Suspect she will be in less pain over the next 1-2 weeks after starting the clindamycin and rifampin.       RTC: 6-8 weeks     Discussed case with Dr. Janyth Contes     SUBJECTIVE:    CC: Hidradenitis Suppurativa    Elizabeth Browning is a 52 y.o. female  who is seen for consultation today at the request of Dr. Meredith Mody for evaluation of hidradenitis suppurativa.    Self-reported severity (0-5): 5  VAS pain today: 10  VAS average pain for the last month: 10  Requiring pain medication? Yes.  If so, what type/frequency? NSAIDs OTC  How often in pain?  continuously  Level of odor (0-5): 0  Level of itching (0-5): 0  Dressing changes needed for drainage:Several times a day  How much drainage: a little drainage  Flare in the last month (Y/N)? Yes.  How long ago was the last flare? in last 6 months  Developing new lesions? once a week  Number of inflammatory lesions montly: 5-10  DLQI: 30  Current treatment: short courses of antibiotics        How helpful is the current treatment in managing the following aspects of your disease?  Not at all helpful Somewhat helpful Very helpful   Pain x     Decreasing length of flares x     Decreasing new lesions x     Drainage x     Decreasing frequency of flares x     Decreasing severity of flares x     Odor x       Disease course:  Year when symptoms first noticed: 63  Year of diagnosis: 24  Who diagnosed you? Dermatologist  Location of first symptoms: groin,  axillae, inframammary, inner thighs and buttocks  Typical involved areas include: groin, axillae, inframammary, inner thighs and buttocks  Typical number of inflammatory lesions each month at baseline (from first visit): 5-10  Disease triggers: not sure    Are menstrual cycles irregular when not on birth control? No.  Current form of contraception: none  Effect of hormonal contraception on disease: no change  Flaring with menstrual cycle (before, during, or after?): no relationship  Difficulty becoming pregnancy? No.  Pregnancy complications? none  How many children? 3  Better or worse with pregnancy?  no change.  f    FH:      Patient Mother Father Son Daughter Brother Sister Maternal Grandmother Maternal Grandfather Paternal Grandmother Paternal Grandfather Maternal Aunt Maternal Uncle Paternal Aunt Paternal Uncle Other:   Derm Hidradenitis Suppurativa   x                                   Pilonidal sinus                                     Acne                                      Dissecting cellulitis(Scalp)                                     Eczema                                     Allergies                    Rheum Joint pains   x                                   SAPHO                                     Pyoderma gangrenosum                                     Back pain  x                                  Auto-immune disease                                     Asthma  x                  Endo Polycystic ovarian syndrome                                     Thyroid disease  Vitamin D deficiency                   Psych Anxiety                                     Depression  x                                   Dementia                                     Suicidal thoughts*                                   Cardio Hypertension                                     High cholesterol                                     Heart attack                                    Stroke                                     Hem-onc Cancer  ______________                                    Anemia                    GI IBD (UC/Crohn's)                                   ID HIV                     Syphilis                     Other                         Social History:  Current or former smoker? former  Amount smoking: less than half ppd  How many years: ?  ED visits in the last 5 years? never  Difficulty affording medications? never  Marital Status: in relationship  Living with some one? Yes.    Prior treatments:  Topical: topical abx  Systemic: Doxy, PCN, keflex (none helpful)   Past surgical procedures: I&Ds  Past laser procedures: none      ROS: the balance of 10 systems is negative unless otherwise documented      OBJECTIVE: Gen: Well-appearing patient, appropriate, interactive, in no acute distress  Skin: Examination of the scalp, face, neck, chest, back, abdomen, bilateral upper and  lower extremities, hands, palms, soles, nails, buttocks, and external genitalia performed today and pertinent for:     location Abscess Inflamed nodule Non-inflamed nodule Draining sinus Non-draining Sinus Hurley % scar   R axilla    2    5   2      L axilla     5    4   1      R inframammary          L inframammary    3        Intermammary    3        Pubic     2        R inguinal    4  3       R thigh   1        L inguinal    3    4       L thigh          Scrotum/Vulva          Perianal          R buttock     2        L buttock          Other (list)                      AN count (total sum of abscess and inflammatory nodule): 22  Pilonidal sinus (Y/N, or previously treated)? No.  Approximate BSA involved by inflammatory lesions: 5%  Intertriginous comedones: few  Diffuse comedones (trunk, face, etc): none  Acne scars: facial  Cribriform scarring: Yes.  Intertrigionus epidermal inclusion cysts: 1-3  Diffuse (trunk, feace, extremities) epidermal inclusion cysts: 1-3

## 2017-08-20 MED ORDER — ADALIMUMAB 80 MG/0.8 ML SUBCUTANEOUS PEN KIT: kit | 0 refills | 0 days

## 2017-08-20 MED ORDER — ADALIMUMAB PEN CITRATE FREE 40 MG/0.4 ML
PACK | SUBCUTANEOUS | 11 refills | 0.00000 days | Status: CP
Start: 2017-08-20 — End: 2017-08-20

## 2017-08-20 MED ORDER — ADALIMUMAB 80 MG/0.8 ML SUBCUTANEOUS PEN KIT
PACK | SUBCUTANEOUS | 0 refills | 0.00000 days | Status: CP
Start: 2017-08-20 — End: 2018-05-06

## 2017-08-20 MED ORDER — ADALIMUMAB PEN CITRATE FREE 40 MG/0.4 ML: each | 11 refills | 0 days

## 2017-08-20 MED ORDER — ADALIMUMAB PEN CITRATE FREE 40 MG/0.4 ML: pen | 11 refills | 0 days

## 2017-08-21 NOTE — Unmapped (Signed)
Per test claim for Humira at the [facility], patient needs Medication Assistance Program for Prior Authorization.

## 2017-08-21 NOTE — Unmapped (Signed)
PA approved for Humira(starter dose and maintenance dose) from 08/21/17 to 08/21/18 for $3.

## 2017-08-25 NOTE — Unmapped (Signed)
[facility]   Patient Onboarding/Medication Counseling    Ms.Boies is a 52 y.o. female with hidradenitis who I am counseling today on initiation of therapy.    Medication: Humira Citrate free, sharps container    Verified patient's date of birth / HIPAA.      Education Provided: ??    Dose/Administration discussed: 2 pens (160 mg) day 1, 1 pen (80 mg) day 15, then 1 pen (40 mg) every week beginning day 29. This medication should be taken  without regard to food.     Storage requirements: this medicine should be stored in the refrigerator.     Side effects discussed: Discussed common side effects, including injection site reaction, risk of infection. If patient experiences fever/chills or severe skin reaction, they need to call the doctor.  Patient will receive a Lexi-Comp drug information handout with shipment.    Handling precautions reviewed:  Patient will dispose of needles in a sharps container or empty laundry detergent bottle.    Drug Interactions: other medications reviewed and up to date in Epic.  No drug interactions identified.    Comorbidities/Allergies: reviewed and up to date in Epic.    Verified therapy is appropriate and should continue      Delivery Information    Anticipated copay of $3 reviewed with patient. Verified delivery address in FSI and reviewed medication storage requirement.    Scheduled delivery date: Wed, Feb 13    Explained that we ship using UPS and this shipment will not require a signature.      Explained the services we provide at [facility] and that each month we would call to set up refills.  Stressed importance of returning phone calls so that we could ensure they receive their medications in time each month.  Informed patient that we should be setting up refills 7-10 days prior to when they will run out of medication.  Informed patient that welcome packet will be sent.      Patient verbalized understanding of the above information as well as how to contact the pharmacy at [phone] option 4 with any questions/concerns.        Patient Specific Needs      ? Patient has no physical or cognitive barriers.    ? Patient prefers to have medications discussed with  Patient     ? Patient is able to read and understand education materials at a high school level or above.        Lanney Gins  [facility] Specialty Pharmacist

## 2017-08-26 MED FILL — SHARPS KIT/NA/MISC: SHARPS KIT/NA/MISC | 120 days supply | Qty: 1 | Fill #0

## 2017-08-26 MED FILL — HUMIRA PEN-CD/UC/HS STARTER/*CITRATE FREE/PNKT: HUMIRA PEN-CD/UC/HS STARTER/*CITRATE FREE/PNKT | 28 days supply | Qty: 3 | Fill #0

## 2017-09-08 NOTE — Unmapped (Signed)
[facility] Refill Coordination Note  Specialty Medication(s): Humira 80mg /0.68ml      Elizabeth Browning, DOB: 06-25-1966  Phone: [phone] (home) , Alternate phone contact: N/A  Phone or address changes today?: No  All above HIPAA information was verified with patient.  Shipping Address: [address]  [place] [address]   Insurance changes? No    Completed refill call assessment today to schedule patient's medication shipment from the [facility] [phone]).      Confirmed the medication and dosage are correct and have not changed: Yes, regimen is correct and unchanged.    Confirmed patient started or stopped the following medications in the past month:  No, there are no changes reported at this time.    Are you tolerating your medication?:  Elizabeth Browning reports tolerating the medication.    ADHERENCE  Did you miss any doses in the past 4 weeks? No missed doses reported.    FINANCIAL/SHIPPING    Delivery Scheduled: Yes, Expected medication delivery date: 09/16/2017     The patient will receive an FSI print out for each medication shipped and additional FDA Medication Guides as required.  Patient education from [employer] or Robet Leu may also be included in the shipment    [place] did not have any additional questions at this time.    Delivery address validated in FSI scheduling system: Yes, address listed in FSI is correct.    We will follow up with patient monthly for standard refill processing and delivery.      Thank you,  Donnivan Villena  Anders Grant   [facility] Specialty Pharmacist

## 2017-09-12 MED FILL — HUMIRA PEN *NO CITRATE*/40MG/0.4ML/PNKT: HUMIRA PEN *NO CITRATE*/40MG/0.4ML/PNKT | 28 days supply | Qty: 4 | Fill #0

## 2017-09-12 NOTE — Unmapped (Signed)
Patient called to report injected Humira pen (80 mg) upside down and did not receive dose. Instructed her to inject two 40 mg pens today, then start one pen every week beginning in 2 weeks (starting March 15).    We will courier maintenance dose prescription for 40 mg pens to her house today.    Damonique Brunelle A. Katrinka Blazing, PharmD - Pharmacist   [facility]   [address], [place], [place] [address]   t [phone] - f [phone]

## 2017-10-07 NOTE — Unmapped (Signed)
[facility] Refill and Clinical Coordination Note  Medication(s): Humira 40mg /0.59ml    Elizabeth Browning, DOB: 1966/07/15  Phone: [phone] (home) , Alternate phone contact: N/A  Shipping address: [address]  [place]  [address]  Phone or address changes today?: No  All above HIPAA information verified.  Insurance changes? No    Completed refill and clinical call assessment today to schedule patient's medication shipment from the [facility] [phone]).      MEDICATION RECONCILIATION    Confirmed the medication and dosage are correct and have not changed: Yes, regimen is correct and unchanged.    Were there any changes to your medication(s) in the past month:  No, there are no changes reported at this time.    ADHERENCE    Is this medicine transplant or covered by Medicare Part B? No.    Did you miss any doses in the past 4 weeks? No missed doses reported.  Adherence counseling provided? Not needed     SIDE EFFECT MANAGEMENT    Are you tolerating your medication?:  Elizabeth Browning reports tolerating the medication.  Side effect management discussed: None      Therapy is appropriate and should be continued.    Evidence of clinical benefit: See Epic note from 08/18/17      FINANCIAL/SHIPPING    Delivery Scheduled: Yes, Expected medication delivery date: 10/09/17   Additional medications refilled: No additional medications/refills needed at this time.    The patient will receive an FSI print out for each medication shipped and additional FDA Medication Guides as required.  Patient education from [place] or Robet Leu may also be included in the shipment.    Elizabeth Browning did not have any additional questions at this time.    Delivery address validated in FSI scheduling system: Yes, address listed above is correct.      We will follow up with patient monthly for standard refill processing and delivery.      Thank you,  Lupita Shutter   [facility] Specialty Pharmacist

## 2017-10-08 MED FILL — HUMIRA PEN *NO CITRATE*/40MG/0.4ML/PNKT: HUMIRA PEN *NO CITRATE*/40MG/0.4ML/PNKT | 28 days supply | Qty: 4 | Fill #1

## 2017-10-16 ENCOUNTER — Ambulatory Visit: Admit: 2017-10-16 | Discharge: 2017-10-17 | Payer: MEDICAID

## 2017-10-16 DIAGNOSIS — L732 Hidradenitis suppurativa: Principal | ICD-10-CM

## 2017-10-16 MED ORDER — RIFAMPIN 300 MG CAPSULE
ORAL_CAPSULE | 2 refills | 0 days | Status: CP
Start: 2017-10-16 — End: 2018-05-06

## 2017-10-16 MED ORDER — CLINDAMYCIN HCL 300 MG CAPSULE
ORAL_CAPSULE | 2 refills | 0 days | Status: CP
Start: 2017-10-16 — End: 2018-05-06

## 2017-10-16 NOTE — Unmapped (Addendum)
I would plan to go ahead with the surgery for you axilla (armpits) as planned.       Hidradenitis Suppurativa; severe Hurley 3 with physical limitations      1. We discussed the typical natural history, pathogenesis, treatment options, and expected course as well as the relapsing and sometimes recalcitrant nature of the disease.      Can re-start clindamycin 300mg  po bid and rifampin 300mg  po bid if desired.  Stopped after 3 weeks because of mild GI upset (no n/v/d).  Recommend some tips to help with this and she is open to re-starting if flaring up.  Discussed risk of GI upset, orange coloration of body fluids, hepatotoxicity, and myelosuppression.     Continue  Humira 80mg  treatment of hidradenitis.  Discussed risks of tuberculosis, other uncommon infections, theoretical risk of malignancies, and other uncommon side effects.  She has a history of treated hepatitis C (glecaprevir/pibrentas course early 2018 with undetectable viral load since then).  Will check other baseline screening labs today.    She has met with a surgeon locally (Dr. Meredith Mody) who has discussed surgical treatment of the disease in her bilateral axillia--would certainly be reasonable to go ahead with now that disease is less active with medical control.  Discussed limited data and clinical experience that HS patients have better surgical outcomes without increased adverse events if biologics are continued without interruption pre and post op if her surgeon is amenable to this.      RTC: 47m         Hidradentis Suppurativa (pronounced ???high-drad-en-eye-tis/sup-your-uh-tee-vah???) is a chronic disease of hair follicles.  The lesions occur most commonly on areas of skin-to-skin contact: under the arms (axillary area), in the groin, around the buttocks, in the region around the anus and genitals, and on the skin between and under the breasts. In women, the underarms, groin, and breast areas are most commonly affected. Men most often have HS lesions around the anus and under the arms and may also have HS at the back of the neck and behind and around the ears.    What does HS look and feel like?   The first thing that someone with HS notices is a tender, raised, red bump that looks like an under-the-skin pimple or boil. Sometimes HS lesions have two or more ???heads.???  In mild disease only an occasional boil or abscess may occur, but in more active disease there can be many new lesions every month.  Some abscesses can become larger and may open and drain pus.  Bleeding and increased odor can also occur. In severe disease, deeper abscesses develop and may connect with each other under the skin to form tunnel-like tracts (sinuses, fistulas).  These may drain constantly, or may temporarily improve and then usually begin draining again over time.  In people who have had sinus tracts for some time, scars form that feel like ropes under the skin. In the very worst cases, networks of sinus tracts can form deeper in the body, including the muscle and other tissues. Many people with severe HS have scars that can limit their ability to freely move their arms or legs, though this is very unlikely for most patients.     Clinicians usually classify or ???grade??? HS using the [facility] staging system according to the severity of the disease for each body location:  ??? Hurley stage I: one or more abscesses are present, but no sinus tracts have formed and no scars have developed  ???  Hurley stage II: one or more abscesses are present that resolve and recur; on sinus tract can be present and scarring is seen  ??? Hurley stage III: many abscesses and more than one sinus tract is present with extensive scars.    What causes HS?  The cause of HS is not completely understood.  It seems to be a disorder of hair follicles and often many family members are affected so genetics probably play a strong role.  Bacteria are often present and may make the disease worse, but infection does not seem to be the main cause. Hormones are also likely play a role since the condition typically starts around puberty when hair follicles under the arms and in the groin start to change.  It can sometimes flare with menstrual cycles in women as well.  In most cases it lasts for decades and starts to improve to some extent in the late 30s and 40s as long as many fistulas have not already formed.  Women are three times more likely than men to develop HS.    Other factors are known to contribute to HS flaring or becoming worse, though they are likely not the main causes. The factors most commonly associated with HS include:  ??? Cigarette smoking - this is very highly linked.  Stopping smoking will likely not cure the disease, but likely is helpful in reducing how much and how often it flares.  ??? Obesity - HS may occur even in people that are not overweight, but it is much more common in patients that are.  There is some evidence that losing weight and eating a diet low in sugars and fats may be helpful in improving hidradenitis, though this is not helpful for everyone.  Working with a nutritionist may be an important way to help with this and is something your physician can help coordinate    Hidradenitis is not contagious.  It is not caused by a problem with personal hygiene or any other activity or behavior of those with the disease.    How can your doctor help you treat your hidradenitis?  Clinicians use both medication and surgery to treat HS. The choice of treatment???or combination of treatments???is made according to an individual patient???s needs. Clinicians consider several factors in determining the most appropriate plan for therapy:  ??? Severity of disease - medications and some laser treatments are usually able to control disease best when fistulas are not present.  Fistulas typically require surgery.  ??? Extent and location of disease  ??? Chronicity (how often the lesions recur)    A number of different surgical methods have been developed that are useful for certain patients under particular circumstances. These can be done with local numbing and healing at home for some areas when disease is not too extensive with relatively brief recovery times.  In more extensive disease there may be a need for larger excisions under general anesthesia with healing time in the hospital and prolonged recovery periods for better disease control.      In addition, many medical treatments have been tried???some with more success than others. No medication is effective for all patients, and you and your doctor may have to try several different agents or combinations of agents before you find the treatment plan that works best for you.  The goals of therapy with medications that are either topical (used on the skin) or systemic (taken by mouth) are:  1. to clear the lesions or at least reduce  their number and extent, and  2. to prevent new lesions from forming.  3. To reduce pain, drainage, and odor  Some of the types of medications commonly used are antibacterial skin washes and the topical antibiotics to prevent secondary infections and corticosteroid injections into the lesions to reduce inflammation.     Other medications that may be used include retinoids (similar to Accutane), drugs that effect how hormones and hair follicles interact, drugs that affect your immune system (such as methotrexate, adalimumab/Humira, and Remicaid/infliximab), steroids, and oral antibiotics.    Lasers that destroy hair follicles can also be helpful since they reduce the hair follicles that cause the problems.  Multiple treatments are typically required over time and there is some discomfort associated with treatment, but it is typically very fast and well-tolerated.    It is very important to realize that hidradenitis cannot be completely cured with any single medication or surgical procedure.  It is a disease that can be very stubborn and difficult to control, but with good treatment a lot of improvement and sometimes temporary remissions can be obtained. Poorly controlled disease can cause more fistulas to form and make managing the disease much more difficult over time so it is important to seek care to reduce major flares.  Surgery can provide a long term cure in some areas, though the disease can start again or continue in nearby areas.  A dermatologist is often the best person to help coordinate disease treatment, and sometimes other surgeons, pain specialists, other specialists, and nutritionists may be part of the treatment team.    What can you do to help your HS?  1. If your are a smoker, then stopping can probably be helpful.  Your dermatologist will be happy to refer you to some one who can help with this.  2. Follow a healthy diet and try to achieve a healthy weight  Some other self-help measures are:  ??? Keep your skin cool and dry (becoming overheated and sweating can contribute to an HS flare)  ??? To reduce the pain of cysts or nodules or to help them to drain, apply hot compresses or soak in hot water for 10 minutes at a time (use a clean washcloth or a teabag soaked in hot water)  ??? For female patients, cotton underwear that does not have tight elastic in the groin can be helpful.  Boyshort, brief, or boxer style underwear may be a better option as friction on hair follicles in affected areas can be a major trigger in some patients.  These can be easily found on [place] or with some retailers.  Fruit of the Loom and Underworks are two brands that are sometimes recommended.    Finally, know that you are not alone. Coping with the pain and other symptoms of HS can be very difficult, so it may be helpful to connect with others who live with HS. Patient groups and networks can be sources of important information and support. Some internet resources for information and connections are provided below.  Resources for Information    The Hidradenitis Suppurativa Foundation: A nonprofit organized by a group of physicians interested in treating and advancing research in hidradenitis suppurativa    American Academy of Dermatology  ARanked.fi    [employer]  ElevatorPitchers.de.html  [employer]ORD: [employer]  https://www.rarediseases.org/rare-disease-information/rare-diseases/byID/358/viewAbstract  Trials of new medications for HS  https://www.clinicaltrials.gov

## 2017-10-16 NOTE — Unmapped (Addendum)
ASSESSMENT/PLAN:    Hidradenitis Suppurativa; severe Hurley 3 with physical limitations      1. We discussed the typical natural history, pathogenesis, treatment options, and expected course as well as the relapsing and sometimes recalcitrant nature of the disease.      Can re-start clindamycin 300mg  po bid and rifampin 300mg  po bid if desired.  Stopped after 3 weeks because of mild GI upset (no n/v/d).  Recommend some tips to help with this and she is open to re-starting if flaring up.  Discussed risk of GI upset, orange coloration of body fluids, hepatotoxicity, and myelosuppression.     Continue  Humira 40mg  treatment of hidradenitis.  Discussed risks of tuberculosis, other uncommon infections, theoretical risk of malignancies, and other uncommon side effects.  She has a history of treated hepatitis C (glecaprevir/pibrentas course early 2018 with undetectable viral load since then).  Will check other baseline screening labs today.    She has met with a surgeon locally (Dr. Meredith Mody) who has discussed surgical treatment of the disease in her bilateral axillia--would certainly be reasonable to go ahead with now that disease is less active with medical control.  Discussed limited data and clinical experience that HS patients have better surgical outcomes without increased adverse events if biologics are continued without interruption pre and post op if her surgeon is amenable to this.      RTC: 30m     Discussed case with Dr. Janyth Contes     SUBJECTIVE:    CC: Hidradenitis Suppurativa    Self-reported severity (0-5): 2  VAS pain today: 3  VAS average pain for the last month: 3  Requiring pain medication? No.  If so, what type/frequency? none  How often in pain?  few times a week  Level of odor (0-5): 2  Level of itching (0-5): 2  Dressing changes needed for drainage:Once a week  How much drainage: a little drainage  Flare in the last month (Y/N)? No.  How long ago was the last flare? in last 6 months  Developing new lesions? less than monthly  Number of inflammatory lesions montly: 1-3  DLQI: 12  Current treatment: Humira 44m of clind/rif d/c'd because of mild GI upset        How helpful is the current treatment in managing the following aspects of your disease?  Not at all helpful Somewhat helpful Very helpful   Pain   x   Decreasing length of flares   x   Decreasing new lesions   x   Drainage   x   Decreasing frequency of flares   x   Decreasing severity of flares   x   Odor   x       Disease course:  Year when symptoms first noticed: 78  Year of diagnosis: 24  Who diagnosed you? Dermatologist  Location of first symptoms: groin, axillae, inframammary, inner thighs and buttocks  Typical involved areas include: groin, axillae, inframammary, inner thighs and buttocks  Typical number of inflammatory lesions each month at baseline (from first visit): 5-10  Disease triggers: not sure    Are menstrual cycles irregular when not on birth control? No.  Current form of contraception: none  Effect of hormonal contraception on disease: no change  Flaring with menstrual cycle (before, during, or after?): no relationship  Difficulty becoming pregnancy? No.  Pregnancy complications? none  How many children? 3  Better or worse with pregnancy?  no change.  f    FH:  Patient Mother Father Son Daughter Brother Sister Maternal Grandmother Maternal Grandfather Paternal Grandmother Paternal Grandfather Maternal Aunt Maternal Uncle Paternal Aunt Paternal Uncle Other:   Derm Hidradenitis Suppurativa   x                                   Pilonidal sinus                                     Acne                                      Dissecting cellulitis(Scalp)                                     Eczema                                     Allergies                    Rheum Joint pains   x                                   SAPHO                                     Pyoderma gangrenosum                                     Back pain  x Auto-immune disease                                     Asthma  x                  Endo Polycystic ovarian syndrome                                     Thyroid disease                    Vitamin D deficiency                   Psych Anxiety                                     Depression  x                                   Dementia  Suicidal thoughts*                                   Cardio Hypertension                                     High cholesterol                                     Heart attack                                    Stroke                                     Hem-onc Cancer  ______________                                    Anemia                    GI IBD (UC/Crohn's)                                   ID HIV                     Syphilis                     Other                         Social History:  Current or former smoker? former  Amount smoking: less than half ppd  How many years: ?  ED visits in the last 5 years? never  Difficulty affording medications? never  Marital Status: in relationship  Living with some one? Yes.    Prior treatments:  Topical: topical abx  Systemic: Doxy, PCN, keflex (none helpful)   Past surgical procedures: I&Ds  Past laser procedures: none      ROS: the balance of 10 systems is negative unless otherwise documented      OBJECTIVE:   Gen: Well-appearing patient, appropriate, interactive, in no acute distress  Skin: Examination of the scalp, face, neck, chest, back, abdomen, bilateral upper and lower extremities, hands, palms, soles, nails, buttocks, and external genitalia performed today and pertinent for:     location Abscess Inflamed nodule Non-inflamed nodule Draining sinus Non-draining Sinus Hurley % scar   R axilla    1 3      6      L axilla     1 2      4      R inframammary          L inframammary     1       Intermammary     4       Pubic      2       R inguinal      1  R thigh           L inguinal               L thigh          Scrotum/Vulva          Perianal          R buttock             L buttock          Other (list)                      AN count (total sum of abscess and inflammatory nodule):2  Pilonidal sinus (Y/N, or previously treated)? No.  Approximate BSA involved by inflammatory lesions: 1%  Intertriginous comedones: few  Diffuse comedones (trunk, face, etc): none  Acne scars: facial  Cribriform scarring: Yes.  Intertrigionus epidermal inclusion cysts: 1-3  Diffuse (trunk, feace, extremities) epidermal inclusion cysts: 1-3

## 2017-10-28 NOTE — Unmapped (Signed)
[facility] Refill Coordination Note  Specialty Medication(s): Humira 40mg /0.49ml  Additional Medications shipped: none    Elizabeth Browning, DOB: 1965-11-04  Phone: [phone] (home) , Alternate phone contact: N/A  Phone or address changes today?: No  All above HIPAA information was verified with patient.  Shipping Address: [address]  [place] [address]   Insurance changes? No    Completed refill call assessment today to schedule patient's medication shipment from the [facility] [phone]).      Confirmed the medication and dosage are correct and have not changed: Yes, regimen is correct and unchanged.    Confirmed patient started or stopped the following medications in the past month:  No, there are no changes reported at this time.    Are you tolerating your medication?:  Kiaja reports tolerating the medication.    ADHERENCE    Did you miss any doses in the past 4 weeks? No missed doses reported.    FINANCIAL/SHIPPING    Delivery Scheduled: Yes, Expected medication delivery date: 11/04/17     The patient will receive an FSI print out for each medication shipped and additional FDA Medication Guides as required.  Patient education from [place] or Robet Leu may also be included in the shipment    [place] did not have any additional questions at this time.    Delivery address validated in FSI scheduling system: Yes, address listed in FSI is correct.    We will follow up with patient monthly for standard refill processing and delivery.      Thank you,  Lupita Shutter   [facility] Specialty Pharmacist

## 2017-11-03 MED FILL — HUMIRA PEN *NO CITRATE*/40MG/0.4ML/PNKT: HUMIRA PEN *NO CITRATE*/40MG/0.4ML/PNKT | 28 days supply | Qty: 4 | Fill #2

## 2017-11-26 NOTE — Unmapped (Signed)
[facility] Refill Coordination Note  Specialty Medication(s): Humira 40mg /0.70ml      Elizabeth Browning, DOB: 1966-04-08  Phone: [phone] (home) , Alternate phone contact: N/A  Phone or address changes today?: No  All above HIPAA information was verified with patient.  Shipping Address: [address]  [place] [address]   Insurance changes? No    Completed refill call assessment today to schedule patient's medication shipment from the [facility] [phone]).      Confirmed the medication and dosage are correct and have not changed: Yes, regimen is correct and unchanged.    Confirmed patient started or stopped the following medications in the past month:  No, there are no changes reported at this time.    Are you tolerating your medication?:  Elizabeth Browning reports tolerating the medication.    ADHERENCE  Did you miss any doses in the past 4 weeks? No missed doses reported.    FINANCIAL/SHIPPING    Delivery Scheduled: Yes, Expected medication delivery date: 12/02/2017     The patient will receive an FSI print out for each medication shipped and additional FDA Medication Guides as required.  Patient education from [place] or Robet Leu may also be included in the shipment    [place] did not have any additional questions at this time.    Delivery address validated in FSI scheduling system: Yes, address listed in FSI is correct.    We will follow up with patient monthly for standard refill processing and delivery.      Thank you,  Kimeka Badour  Anders Grant   [facility] Specialty Pharmacist

## 2017-12-01 MED FILL — HUMIRA PEN *NO CITRATE*/40MG/0.4ML/PNKT: HUMIRA PEN *NO CITRATE*/40MG/0.4ML/PNKT | 28 days supply | Qty: 4 | Fill #3

## 2017-12-24 NOTE — Unmapped (Signed)
[facility] Refill Coordination Note  Specialty Medication(s): Humira  Additional Medications shipped: n/a    Elizabeth Browning, DOB: 07/28/1965  Phone: [phone] (home) , Alternate phone contact: N/A  Phone or address changes today?: No  All above HIPAA information was verified with patient.  Shipping Address: [address]  [place] [address]   Insurance changes? No    Completed refill call assessment today to schedule patient's medication shipment from the [facility] [phone]).      Confirmed the medication and dosage are correct and have not changed: Yes, regimen is correct and unchanged.    Confirmed patient started or stopped the following medications in the past month:  No, there are no changes reported at this time.    Are you tolerating your medication?:  Elizabeth Browning reports tolerating the medication.    ADHERENCE    Is this medicine transplant or covered by Medicare Part B? No.        Did you miss any doses in the past 4 weeks? No missed doses reported.    FINANCIAL/SHIPPING    Delivery Scheduled: Yes, Expected medication delivery date: 12/26/17     The patient will receive an FSI print out for each medication shipped and additional FDA Medication Guides as required.  Patient education from [place] or Robet Leu may also be included in the shipment.    Elizabeth Browning did not have any additional questions at this time.    Delivery address validated in FSI scheduling system: Yes, address listed in FSI is correct.    We will follow up with patient monthly for standard refill processing and delivery.      Thank you,  Mecca Guitron Vangie Bicker   [facility] Specialty Pharmacist

## 2017-12-25 MED FILL — HUMIRA PEN *NO CITRATE*/40MG/0.4ML/PNKT: HUMIRA PEN *NO CITRATE*/40MG/0.4ML/PNKT | 28 days supply | Qty: 4 | Fill #4

## 2018-01-20 MED FILL — HUMIRA PEN *NO CITRATE*/40MG/0.4ML/PNKT: HUMIRA PEN *NO CITRATE*/40MG/0.4ML/PNKT | 28 days supply | Qty: 4 | Fill #5

## 2018-01-20 NOTE — Unmapped (Signed)
[facility] Refill Coordination Note    Specialty Medication(s) to be Shipped:   Inflammatory Disorders: Humira    Other medication(s) to be shipped: n/a     Elizabeth Browning, DOB: January 21, 1966  Phone: [phone] (home)   Shipping Address: [address]  [place] [address]    All above HIPAA information was verified with patient.     Completed refill call assessment today to schedule patient's medication shipment from the [facility] [phone]).       Specialty medication(s) and dose(s) confirmed: Regimen is correct and unchanged.   Changes to medications: [name] reports no changes reported at this time.  Changes to insurance: No  Questions for the pharmacist: No    The patient will receive an FSI print out for each medication shipped and additional FDA Medication Guides as required.  Patient education from [place] or Robet Leu may also be included in the shipment.    DISEASE-SPECIFIC INFORMATION        For Inflammatory disorders patients on injectable medications: Patient currently has 1 doses left.  Next injection is scheduled for friday (new box to be opened on 7/19).    ADHERENCE     Medication Adherence    Patient reported X missed doses in the last month:  0  Specialty Medication:  HUMIRA  Patient is on additional specialty medications:  No  Patient is on more than two specialty medications:  No  Any gaps in refill history greater than 2 weeks in the last 3 months:  no  Demonstrates understanding of importance of adherence:  yes  Informant:  patient  Reliability of informant:  reliable  Confirmed plan for next specialty medication refill:  delivery by pharmacy  Refills needed for supportive medications:  not needed          Refill Coordination    Has the Patients' Contact Information Changed:  No  Is the Shipping Address Different:  No         SHIPPING     Shipping address confirmed in FSI.     Delivery Scheduled: Yes, Expected medication delivery date: 7/11 via UPS or courier.     Renette Butters   [facility] Shared [facility] Specialty Technician

## 2018-02-16 NOTE — Unmapped (Signed)
[facility] Refill Coordination Note  Specialty Medication(s): HUMIRA      Elizabeth Browning, DOB: Jun 16, 1966  Phone: [phone] (home) , Alternate phone contact: N/A  Phone or address changes today?: Yes PHONE IS NOW [phone]- UNABLE TO UPDATE IN EPIC  All above HIPAA information was verified with patient.  Shipping Address: [address]  [place] [address]   Insurance changes? No    Completed refill call assessment today to schedule patient's medication shipment from the [facility] [phone]).      Confirmed the medication and dosage are correct and have not changed: Yes, regimen is correct and unchanged.    Confirmed patient started or stopped the following medications in the past month:  No, there are no changes reported at this time.    Are you tolerating your medication?:  Elizabeth Browning reports tolerating the medication.    ADHERENCE    (Below is required for Medicare Part B or Transplant patients only - per drug):   How many tablets were dispensed last month: 4  Patient currently has 1 TO BE USED THIS Friday, 8/9 remaining.    Did you miss any doses in the past 4 weeks? No missed doses reported.    FINANCIAL/SHIPPING    Delivery Scheduled: Yes, Expected medication delivery date: 8/7     The patient will receive an FSI print out for each medication shipped and additional FDA Medication Guides as required.  Patient education from [place] or Elizabeth Browning may also be included in the shipment    [place] did not have any additional questions at this time.    Delivery address validated in FSI scheduling system: Yes, address listed in FSI is correct.    We will follow up with patient monthly for standard refill processing and delivery.      Thank you,  Elizabeth Browning   [facility] Shared [facility] Specialty Technician

## 2018-02-17 MED FILL — HUMIRA PEN *NO CITRATE*/40MG/0.4ML/PNKT: HUMIRA PEN *NO CITRATE*/40MG/0.4ML/PNKT | 28 days supply | Qty: 4 | Fill #6

## 2018-03-11 NOTE — Unmapped (Signed)
[facility] Refill and Clinical Coordination Note  Medication(s): Humira 40mg /0.68ml    Elizabeth Browning, DOB: January 30, 1966  Phone: [phone] (home) , Alternate phone contact: N/A  Shipping address: [address]  [place] [address]  Phone or address changes today?: No  All above HIPAA information verified.  Insurance changes? No    Completed refill and clinical call assessment today to schedule patient's medication shipment from the [facility] [phone]).      MEDICATION RECONCILIATION    Confirmed the medication and dosage are correct and have not changed: Yes, regimen is correct and unchanged.    Were there any changes to your medication(s) in the past month:  No, there are no changes reported at this time.    ADHERENCE    Is this medicine transplant or covered by Medicare Part B? No.    Did you miss any doses in the past 4 weeks? No missed doses reported.  Adherence counseling provided? Not needed     SIDE EFFECT MANAGEMENT    Are you tolerating your medication?:  Elizabeth Browning reports tolerating the medication.  Side effect management discussed: None      Therapy is appropriate and should be continued.    Evidence of clinical benefit: See Epic note from 10/16/17      FINANCIAL/SHIPPING    Delivery Scheduled: Yes, Expected medication delivery date: 03/18/18     Additional medications refilled: No additional medications/refills needed at this time.    The patient will receive a drug information handout for each medication shipped and additional FDA Medication Guides as required.      Elizabeth Browning did not have any additional questions at this time.    Delivery address confirmed in Epic.     We will follow up with patient monthly for standard refill processing and delivery.      Thank you,  Lupita Shutter   [facility] Specialty Pharmacist

## 2018-03-17 MED FILL — HUMIRA PEN CITRATE FREE 40 MG/0.4 ML: 28 days supply | Qty: 4 | Fill #0 | Status: AC

## 2018-03-17 MED FILL — HUMIRA PEN CITRATE FREE 40 MG/0.4 ML: 28 days supply | Qty: 4 | Fill #0

## 2018-04-08 NOTE — Unmapped (Signed)
Patient hasn't missed any doses- she has one pen on hand to use this week  Asked for number for dermatologist to schedule appointment-gave her number from prescription    [facility] Refill Coordination Note    Specialty Medication(s) to be Shipped:   Inflammatory Disorders: Humira    Other medication(s) to be shipped: n/a     Quita Skye, DOB: [birth year]  Phone: [phone] (home)   Shipping Address: [address]  [place] [address]    All above HIPAA information was verified with patient.     Completed refill call assessment today to schedule patient's medication shipment from the [facility] [phone]).       Specialty medication(s) and dose(s) confirmed: Regimen is correct and unchanged.   Changes to medications: Naylani reports no changes reported at this time.  Changes to insurance: No  Questions for the pharmacist: No    The patient will receive a drug information handout for each medication shipped and additional FDA Medication Guides as required.      DISEASE/MEDICATION-SPECIFIC INFORMATION        For Inflammatory disorders patients on injectable medications: Patient currently has 1 doses left.  Next injection is scheduled for this week.    ADHERENCE          No missed doses reported    SHIPPING     Shipping address confirmed in Epic.     Delivery Scheduled: Yes, Expected medication delivery date: 9/27 via UPS or courier.     Renette Butters   [facility] Shared [facility] Specialty Technician

## 2018-04-09 MED FILL — HUMIRA PEN CITRATE FREE 40 MG/0.4 ML: 28 days supply | Qty: 4 | Fill #1 | Status: AC

## 2018-04-09 MED FILL — HUMIRA PEN CITRATE FREE 40 MG/0.4 ML: 28 days supply | Qty: 4 | Fill #1

## 2018-05-01 NOTE — Unmapped (Signed)
[facility] Refill Coordination Note  Specialty Medication(s): Humira 40mg   Additional Medications shipped: none    Quita Skye, DOB: 1965/11/26  Phone: [phone] (home) , Alternate phone contact: N/A  Phone or address changes today?: No  All above HIPAA information was verified with patient.  Shipping Address: [address]  [place] [address]   Insurance changes? No    Completed refill call assessment today to schedule patient's medication shipment from the [facility] [phone]).      Confirmed the medication and dosage are correct and have not changed: Yes, regimen is correct and unchanged.    Confirmed patient started or stopped the following medications in the past month:  No, there are no changes reported at this time.    Are you tolerating your medication?:  Francheska reports tolerating the medication.    ADHERENCE    (Below is required for Medicare Part B or Transplant patients only - per drug):   How many tablets were dispensed last month: 4 pens  Patient currently has 2 remaining. One due today (05/01/18) and next due (05/08/18)    Did you miss any doses in the past 4 weeks? No missed doses reported.    FINANCIAL/SHIPPING    Delivery Scheduled: Yes, Expected medication delivery date: 05/07/2018     Medication will be delivered via UPS to the home address in [facility].    The patient will receive a drug information handout for each medication shipped and additional FDA Medication Guides as required.      Chyan did not have any additional questions at this time.    We will follow up with patient monthly for standard refill processing and delivery.      Thank you,  Breck Coons Shared [facility] Specialty Pharmacist

## 2018-05-06 ENCOUNTER — Ambulatory Visit: Admit: 2018-05-06 | Discharge: 2018-05-07 | Payer: MEDICAID

## 2018-05-06 DIAGNOSIS — L732 Hidradenitis suppurativa: Principal | ICD-10-CM

## 2018-05-06 MED ORDER — CLINDAMYCIN HCL 300 MG CAPSULE
ORAL_CAPSULE | 1 refills | 0 days | Status: SS
Start: 2018-05-06 — End: 2019-03-15

## 2018-05-06 MED ORDER — IBUPROFEN 800 MG TABLET
ORAL_TABLET | Freq: Three times a day (TID) | ORAL | 1 refills | 0 days | Status: SS | PRN
Start: 2018-05-06 — End: 2019-03-09

## 2018-05-06 MED ORDER — RIFAMPIN 300 MG CAPSULE
ORAL_CAPSULE | 1 refills | 0 days | Status: SS
Start: 2018-05-06 — End: 2019-03-15

## 2018-05-06 MED ORDER — HYDROCODONE 5 MG-ACETAMINOPHEN 325 MG TABLET
ORAL_TABLET | Freq: Four times a day (QID) | ORAL | 0 refills | 0 days | Status: SS | PRN
Start: 2018-05-06 — End: 2019-03-09

## 2018-05-06 MED ORDER — ADALIMUMAB PEN CITRATE FREE 40 MG/0.4 ML
11 refills | 0 days | Status: CP
Start: 2018-05-06 — End: 2019-03-17
  Filled 2018-05-19: qty 8, 28d supply, fill #0

## 2018-05-06 MED ORDER — EMPTY CONTAINER
2 refills | 0 days
Start: 2018-05-06 — End: ?

## 2018-05-06 MED ORDER — SPIRONOLACTONE 50 MG TABLET
ORAL_TABLET | Freq: Every day | ORAL | 3 refills | 0 days | Status: SS
Start: 2018-05-06 — End: 2019-03-15

## 2018-05-06 MED FILL — HUMIRA PEN CITRATE FREE 40 MG/0.4 ML: 28 days supply | Qty: 4 | Fill #2 | Status: AC

## 2018-05-06 MED FILL — HUMIRA PEN CITRATE FREE 40 MG/0.4 ML: 28 days supply | Qty: 4 | Fill #2

## 2018-05-06 NOTE — Unmapped (Signed)

## 2018-05-06 NOTE — Unmapped (Signed)
ASSESSMENT/PLAN:    Hidradenitis Suppurativa; severe Hurley 3     1. We discussed the typical natural history, pathogenesis, treatment options, and expected course as well as the relapsing and sometimes recalcitrant nature of the disease.      Overall improving after large WLE in bilateral axilla, and medical management with Humira.  However, over the last 1 to 2 months has been flaring more on the buttocks, and her thighs and developed a few new inflammatory nodules left axilla (distinct from excision).      Discussed treatment options including transition to Remicade, increasing dosing of Humira.  We will appeal for 800 mg weekly of adalimumab due to waning efficacy of 40 mg weekly dosing.      In the short-term, will restart antibiotics (clindamycin and rifampin 300 mg both twice daily), which she tolerated well before.     May consider surgical treatment of disease on buttocks moving forward (strongly prefers general anesthesia 2 procedures in clinic, and she will talk with surgeon she is established with locally about this, Dr. Meredith Mody)      Increase Humira 80mg  weekly subcu for treatment of hidradenitis.  Discussed risks of tuberculosis, other uncommon infections, theoretical risk of malignancies, and other uncommon side effects.  Quant gold negative She has a history of treated hepatitis C (glecaprevir/pibrentas course early 2018 with undetectable viral load since then).        Pain control:     Discussed that we cannot provide opiates long-term for pain control, and that ultimately goal is better control with disease.     -Can trial ibuprofen 800 mg every 8-12 hours as needed for pain  -Continue gabapentin, prescribed by her primary doctor  -Declines referral to pain clinic      RTC: 59m     Discussed case with Dr. Janyth Contes     SUBJECTIVE:    CC: Hidradenitis Suppurativa    Here for follow-up on hidradenitis suppurativa last seen 10/2017 by myself.  In the interim she had large wide local excisions in bilateral axilla from local surgeon which have healed very well.  One new inflammatory nodule left axilla but otherwise has been disease-free there.  However over the last 2 to 3 months has been having increasing flares on buttocks abdomen and occasionally around breast.  Humira definitely seems like it is helping but not as much as when she started.  Not currently on any antibiotics.      Disease course:  Year when symptoms first noticed: 35  Year of diagnosis: 24  Who diagnosed you? Dermatologist  Location of first symptoms: groin, axillae, inframammary, inner thighs and buttocks  Typical involved areas include: groin, axillae, inframammary, inner thighs and buttocks  Typical number of inflammatory lesions each month at baseline (from first visit): 5-10  Disease triggers: not sure    Are menstrual cycles irregular when not on birth control? No.  Current form of contraception: none  Effect of hormonal contraception on disease: no change  Flaring with menstrual cycle (before, during, or after?): no relationship  Difficulty becoming pregnancy? No.  Pregnancy complications? none  How many children? 3  Better or worse with pregnancy?  no change.  f    FH:      Patient Mother Father Son Daughter Brother Sister Maternal Grandmother Maternal Grandfather Paternal Grandmother Paternal Grandfather Maternal Aunt Maternal Uncle Paternal Aunt Paternal Uncle Other:   Derm Hidradenitis Suppurativa   x  Pilonidal sinus                                     Acne                                      Dissecting cellulitis(Scalp)                                     Eczema                                     Allergies                    Rheum Joint pains   x                                   SAPHO                                     Pyoderma gangrenosum                                     Back pain  x                                  Auto-immune disease                                     Asthma  x Endo Polycystic ovarian syndrome                                     Thyroid disease                    Vitamin D deficiency                   Psych Anxiety                                     Depression  x                                   Dementia                                     Suicidal thoughts*                                   Cardio Hypertension  High cholesterol                                     Heart attack                                    Stroke                                     Hem-onc Cancer  ______________                                    Anemia                    GI IBD (UC/Crohn's)                                   ID HIV                     Syphilis                     Other                         Social History:  Current or former smoker? former  Amount smoking: less than half ppd  How many years: ?  ED visits in the last 5 years? never  Difficulty affording medications? never  Marital Status: in relationship  Living with some one? Yes.    Prior treatments:  Topical: topical abx  Systemic: Doxy, PCN, keflex (none helpful)   Past surgical procedures: I&Ds  Past laser procedures: none      ROS: the balance of 10 systems is negative unless otherwise documented      OBJECTIVE:   Gen: Well-appearing patient, appropriate, interactive, in no acute distress  Skin: Examination of the scalp, face, neck, chest, back, abdomen, bilateral upper and lower extremities, hands, palms, soles, nails, buttocks, and external genitalia performed today and pertinent for:     location Abscess Inflamed nodule Non-inflamed nodule Draining sinus Non-draining Sinus Hurley % scar   R axilla                90   L axilla     1         80   R inframammary          L inframammary     1       Intermammary     3       Pubic   1            R inguinal             R thigh           L inguinal               L thigh          Scrotum/Vulva   1        Perianal          R buttock  L buttock   3  1  3      Other (list)                      AN count (total sum of abscess and inflammatory nodule):5  Pilonidal sinus (Y/N, or previously treated)? No.  Approximate BSA involved by inflammatory lesions: 1%  Intertriginous comedones: few  Diffuse comedones (trunk, face, etc): none  Acne scars: facial  Cribriform scarring: Yes.  Intertrigionus epidermal inclusion cysts: 1-3  Diffuse (trunk, feace, extremities) epidermal inclusion cysts: 1-3

## 2018-05-06 NOTE — Unmapped (Signed)
location Abscess Inflamed nodule Non-inflamed nodule Draining sinus Non-draining Sinus Hurley % scar   R axilla          L axilla  1        R inframammary          L inframammary          Intermammary          Pubic          R inguinal          R thigh          L inguinal          L thigh          Scrotum/Vulva          Perianal          R buttock          L buttock          R abdomen 1                     R axilla 2 well healed scars post-surgery

## 2018-05-06 NOTE — Unmapped (Signed)
-----   Message from Lucia Gaskins sent at 05/06/2018 10:46 AM EDT -----  Regarding: Schedule pt for [facility] in 3 months for HS  Please schedule pt for appt in 3 months with [facility] for the HS clinic.    Thanks,    Amil Amen

## 2018-05-07 NOTE — Unmapped (Signed)
[facility] Specialty Medication Referral: PA APPROVED    Medication (Brand/Generic): HUMIRA NEW DOSE    Initial Test Claim completed with resulted information below:  No PA required  Patient ABLE to fill at [facility]  Insurance Company:  [place] MEDICAID  Anticipated Copay: $3  Is anticipated copay with a copay card or grant? NO    As Co-pay is under $25 defined limit, per policy there will be no further investigation of need for financial assistance at this time unless patient requests. This referral has been communicated to the provider and handed off to the [facility] team for further processing and filling of prescribed medication.   ______________________________________________________________________  Please utilize this referral for viewing purposes as it will serve as the central location for all relevant documentation and updates.

## 2018-05-08 NOTE — Unmapped (Signed)
Spoke with patient, she is aware she can go ahead and increase her dose of Humira to 80 mg weekly. Advised her to look out for any new infections or other adverse effects. We'll plan to call in ~ week to schedule next delivery since this month's package of 4 pens will run out more quickly.    Elizabeth Browning  [facility]

## 2018-05-18 NOTE — Unmapped (Signed)
[facility] Refill and Clinical Coordination Note  Medication(s): Humira 40mg /0.26ml    Elizabeth Browning, DOB: 12-04-1965  Phone: [phone] (home) , Alternate phone contact: N/A  Shipping address: [address]  [place] [address]  Phone or address changes today?: No  All above HIPAA information verified.  Insurance changes? No    Completed refill and clinical call assessment today to schedule patient's medication shipment from the [facility] [phone]).      MEDICATION RECONCILIATION    Confirmed the medication and dosage are correct and have not changed: Yes, regimen is correct and unchanged.    Were there any changes to your medication(s) in the past month:  No, there are no changes reported at this time.    ADHERENCE    Is this medicine transplant or covered by Medicare Part B? No.    Did you miss any doses in the past 4 weeks? No missed doses reported.  Adherence counseling provided? Not needed     SIDE EFFECT MANAGEMENT    Are you tolerating your medication?:  [name] reports tolerating the medication.  Side effect management discussed: None      Therapy is appropriate and should be continued.    Evidence of clinical benefit: See Epic note from 05/06/18      FINANCIAL/SHIPPING    Delivery Scheduled: Yes, Expected medication delivery date: 05/20/18     Medication will be delivered via UPS to the home address in [facility].    Additional medications refilled: No additional medications/refills needed at this time.    The patient will receive a drug information handout for each medication shipped and additional FDA Medication Guides as required.      [name] did not have any additional questions at this time.    Delivery address confirmed in Epic.     We will follow up with patient monthly for standard refill processing and delivery.      Thank you,  Lupita Shutter   [facility] Specialty Pharmacist

## 2018-05-19 MED FILL — HUMIRA PEN CITRATE FREE 40 MG/0.4 ML: 28 days supply | Qty: 8 | Fill #0 | Status: AC

## 2018-05-19 NOTE — Unmapped (Signed)
External referral to surgery place to Dr. Burna Sis.

## 2018-05-19 NOTE — Unmapped (Signed)
-----   Message from Ricky Stabs sent at 05/14/2018 10:20 AM EDT -----  Regarding: RE: needs referral  I dont see that there has been an external referral placed for this patient.   ----- Message -----  From: Audie Clear, PA  Sent: 05/13/2018   1:18 PM EDT  To: Ricky Stabs  Subject: RE: needs referral                               Hey,     Sorry if Huntley Dec or someone else has already responsed--this is Careers adviser she has seen before for HS surgery, so not sure why she need referral, maybe for Medicaid?  If we can send a referral for her office for consideration of surgery for HS that would be great.     Thanks,   Christiane Ha    ----- Message -----  From: Ricky Stabs  Sent: 05/07/2018   4:40 PM EDT  To: Audie Clear, PA  Subject: needs referral                                   Ms. Cilento states that she does need a referral to Burna Sis     [phone] Phone    [phone] Fax

## 2018-06-09 MED FILL — HUMIRA PEN CITRATE FREE 40 MG/0.4 ML: 28 days supply | Qty: 8 | Fill #1

## 2018-06-09 MED FILL — HUMIRA PEN CITRATE FREE 40 MG/0.4 ML: 28 days supply | Qty: 8 | Fill #1 | Status: AC

## 2018-06-09 NOTE — Unmapped (Signed)
[facility] Refill Coordination Note    Specialty Medication(s) to be Shipped:   Inflammatory Disorders: Humira    Other medication(s) to be shipped: n/a     Elizabeth Browning, DOB: 1965-10-20  Phone: [phone] (home)       All above HIPAA information was verified with patient.     Completed refill call assessment today to schedule patient's medication shipment from the [facility] [phone]).       Specialty medication(s) and dose(s) confirmed: Regimen is correct and unchanged.   Changes to medications: Sotiria reports no changes reported at this time.  Changes to insurance: No  Questions for the pharmacist: No    The patient will receive a drug information handout for each medication shipped and additional FDA Medication Guides as required.      DISEASE/MEDICATION-SPECIFIC INFORMATION        For Inflammatory disorders patients on injectable medications: Patient currently has 1 doses left.  Next injection is scheduled for friday .    ADHERENCE     Medication Adherence    Patient reported X missed doses in the last month:  0  Specialty Medication:  humira  Patient is on additional specialty medications:  No  Patient is on more than two specialty medications:  No  Any gaps in refill history greater than 2 weeks in the last 3 months:  no  Demonstrates understanding of importance of adherence:  yes  Informant:  patient  Reliability of informant:  reliable              Confirmed plan for next specialty medication refill:  delivery by pharmacy  Refills needed for supportive medications:  not needed          Refill Coordination    Has the Patients' Contact Information Changed:  No  Is the Shipping Address Different:  No         SHIPPING     Shipping address confirmed in Epic.     Delivery Scheduled: Yes, Expected medication delivery date: 11/27 via UPS or courier.     Medication will be delivered via UPS to the home address in Epic [facility].    Renette Butters   [facility] Specialty Technician

## 2018-07-01 NOTE — Unmapped (Signed)
[facility] Refill Coordination Note    Specialty Medication(s) to be Shipped:   Inflammatory Disorders: Humira    Other medication(s) to be shipped: sharps container     Elizabeth Browning, DOB: 11-Jul-1966  Phone: [phone] (home)       All above HIPAA information was verified with patient.     Completed refill call assessment today to schedule patient's medication shipment from the [facility] [phone]).       Specialty medication(s) and dose(s) confirmed: Regimen is correct and unchanged.   Changes to medications: Khristen reports no changes reported at this time.  Changes to insurance: No  Questions for the pharmacist: No    The patient will receive a drug information handout for each medication shipped and additional FDA Medication Guides as required.      DISEASE/MEDICATION-SPECIFIC INFORMATION        For patients on injectable medications: Patient currently has 2 doses left.  Next injection is scheduled for friday.    SPECIALTY MEDICATION ADHERENCE     Medication Adherence    Patient reported X missed doses in the last month:  0  Specialty Medication:  humira  Patient is on additional specialty medications:  No  Patient is on more than two specialty medications:  No  Any gaps in refill history greater than 2 weeks in the last 3 months:  no  Demonstrates understanding of importance of adherence:  yes  Informant:  patient  Reliability of informant:  reliable              Confirmed plan for next specialty medication refill:  delivery by pharmacy  Refills needed for supportive medications:  not needed          Refill Coordination    Has the Patients' Contact Information Changed:  No  Is the Shipping Address Different:  No           Humira 40mg /0.42ml: Patient has 14 days of medication on hand      SHIPPING     Shipping address confirmed in Epic.     Delivery Scheduled: Yes, Expected medication delivery date: 12/20.     Medication will be delivered via UPS to the home address in Epic WAM.    Renette Butters   [facility] Specialty Technician

## 2018-07-02 MED FILL — HUMIRA PEN CITRATE FREE 40 MG/0.4 ML: 28 days supply | Qty: 8 | Fill #2

## 2018-07-02 MED FILL — EMPTY CONTAINER: 120 days supply | Qty: 1 | Fill #0 | Status: AC

## 2018-07-02 MED FILL — HUMIRA PEN CITRATE FREE 40 MG/0.4 ML: 28 days supply | Qty: 8 | Fill #2 | Status: AC

## 2018-07-02 MED FILL — EMPTY CONTAINER: 120 days supply | Qty: 1 | Fill #0

## 2018-07-23 NOTE — Unmapped (Signed)
[facility] Refill Coordination Note  Specialty Medication(s): Humira CF  Additional Medications shipped: none    Elizabeth Browning, DOB: May 26, 1966  Phone: [phone] (home) , Alternate phone contact: N/A  Phone or address changes today?: No  All above HIPAA information was verified with patient.  Shipping Address: [address]  [place] [address]   Insurance changes? No    Completed refill call assessment today to schedule patient's medication shipment from the [facility] [phone]).      Confirmed the medication and dosage are correct and have not changed: Yes, regimen is correct and unchanged.    Confirmed patient started or stopped the following medications in the past month:  No, there are no changes reported at this time.    Are you tolerating your medication?:  Elizabeth Browning reports side effects of joint aches.    ADHERENCE    (Below is required for Medicare Part B or Transplant patients only - per drug):   How many injectionswere dispensed last month: 8 injections  Patient currently has 3 injections remaining.    Did you miss any doses in the past 4 weeks? No missed doses reported.    FINANCIAL/SHIPPING    Delivery Scheduled: Yes, Expected medication delivery date: 07/30/18     Medication will be delivered via UPS to the home address in [place].    The patient will receive a drug information handout for each medication shipped and additional FDA Medication Guides as required.      Elizabeth Browning did not have any additional questions at this time.    We will follow up with patient monthly for standard refill processing and delivery.      Thank you,  Tera Helper   [facility] Specialty Pharmacist

## 2018-07-29 MED FILL — HUMIRA PEN CITRATE FREE 40 MG/0.4 ML: 28 days supply | Qty: 8 | Fill #3

## 2018-07-29 MED FILL — HUMIRA PEN CITRATE FREE 40 MG/0.4 ML: 28 days supply | Qty: 8 | Fill #3 | Status: AC

## 2018-08-20 MED FILL — HUMIRA PEN CITRATE FREE 40 MG/0.4 ML: 28 days supply | Qty: 8 | Fill #4 | Status: AC

## 2018-08-20 MED FILL — HUMIRA PEN CITRATE FREE 40 MG/0.4 ML: 28 days supply | Qty: 8 | Fill #4

## 2018-08-20 NOTE — Unmapped (Signed)
[facility] Refill Coordination Note  Specialty Medication(s): Humira 40mg /0.41ml CF Pen  Additional Medications shipped: none    Elizabeth Browning, DOB: 12/13/1965  Phone: [phone] (home) , Alternate phone contact: N/A  Phone or address changes today?: No  All above HIPAA information was verified with patient.  Shipping Address: [address]  [place] [address]   Insurance changes? No    Completed refill call assessment today to schedule patient's medication shipment from the [facility] [phone]).      Confirmed the medication and dosage are correct and have not changed: Yes, regimen is correct and unchanged.    Confirmed patient started or stopped the following medications in the past month:  No, there are no changes reported at this time.    Are you tolerating your medication?:  Elizabeth Browning reports tolerating the medication.    ADHERENCE    Patient currently has 2 pens remaining.    Did you miss any doses in the past 4 weeks? No missed doses reported.    FINANCIAL/SHIPPING    Delivery Scheduled: Yes, Expected medication delivery date: 08/21/2018     Medication will be delivered via UPS to the home address in [facility].    The patient will receive a drug information handout for each medication shipped and additional FDA Medication Guides as required.      Elizabeth Browning did not have any additional questions at this time.    We will follow up with patient monthly for standard refill processing and delivery.      Thank you,  Roderic Palau   [facility] Specialty Pharmacist

## 2018-09-09 NOTE — Unmapped (Signed)
[facility] Pharm[facility] Refill Coordination Note    Specialty Medication(s) to be Shipped:   Inflammatory Disorders: Humira    Other medication(s) to be shipped: sharps container     Elizabeth Browning, DOB: [birth year]  Phone: [phone] (home)       All above HIPAA information was verified with patient.     Completed refill call assessment today to schedule patient's medication shipment from the [facility] [phone]).       Specialty medication(s) and dose(s) confirmed: Regimen is correct and unchanged.   Changes to medications: Elizabeth Browning reports no changes reported at this time.  Changes to insurance: No  Questions for the pharmacist: No    Confirmed patient received Welcome Packet with first shipment. The patient will receive a drug information handout for each medication shipped and additional FDA Medication Guides as required.       DISEASE/MEDICATION-SPECIFIC INFORMATION        N/A    SPECIALTY MEDICATION ADHERENCE     Medication Adherence    Patient reported X missed doses in the last month:  0  Specialty Medication:  humira  Patient is on additional specialty medications:  No  Patient is on more than two specialty medications:  No  Any gaps in refill history greater than 2 weeks in the last 3 months:  no  Demonstrates understanding of importance of adherence:  yes  Informant:  patient  Reliability of informant:  reliable  Patient is at risk for Non-Adherence:  No  Confirmed plan for next specialty medication refill:  delivery by pharmacy  Refills needed for supportive medications:  not needed          Refill Coordination    Has the Patients' Contact Information Changed:  No  Is the Shipping Address Different:  No         humira injections. Patient has 4 pens remaining.      SHIPPING     Shipping address confirmed in Epic.     Delivery Scheduled: Yes, Expected medication delivery date: 030320.     Medication will be delivered via UPS to the home address in Epic WAM. Elizabeth Browning   [facility] Specialty Technician

## 2018-09-14 MED FILL — EMPTY CONTAINER: 120 days supply | Qty: 1 | Fill #1

## 2018-09-14 MED FILL — HUMIRA PEN CITRATE FREE 40 MG/0.4 ML: 28 days supply | Qty: 8 | Fill #5 | Status: AC

## 2018-09-14 MED FILL — EMPTY CONTAINER: 120 days supply | Qty: 1 | Fill #1 | Status: AC

## 2018-09-14 MED FILL — HUMIRA PEN CITRATE FREE 40 MG/0.4 ML: 28 days supply | Qty: 8 | Fill #5

## 2018-10-07 NOTE — Unmapped (Signed)
[facility] Refill Coordination Note    Specialty Medication(s) to be Shipped:   Inflammatory Disorders: Humira    Other medication(s) to be shipped: na     Elizabeth Browning, DOB: [birth year]  Phone: [phone] (home)       All above HIPAA information was verified with patient.     Completed refill call assessment today to schedule patient's medication shipment from the [facility] [phone]).       Specialty medication(s) and dose(s) confirmed: Regimen is correct and unchanged.   Changes to medications: Elizabeth Browning reports no changes reported at this time.  Changes to insurance: No  Questions for the pharmacist: No    Confirmed patient received Welcome Packet with first shipment. The patient will receive a drug information handout for each medication shipped and additional FDA Medication Guides as required.       DISEASE/MEDICATION-SPECIFIC INFORMATION        N/A    SPECIALTY MEDICATION ADHERENCE     Medication Adherence    Patient reported X missed doses in the last month:  0  Specialty Medication:  humira  Patient is on additional specialty medications:  No  Patient is on more than two specialty medications:  No  Any gaps in refill history greater than 2 weeks in the last 3 months:  no  Demonstrates understanding of importance of adherence:  yes  Informant:  patient  Reliability of informant:  reliable  Confirmed plan for next specialty medication refill:  delivery by pharmacy  Refills needed for supportive medications:  not needed          Refill Coordination    Has the Patients' Contact Information Changed:  No  Is the Shipping Address Different:  No           humira injections. Patient has 4 pens remaining      SHIPPING     Shipping address confirmed in Epic.     Delivery Scheduled: Yes, Expected medication delivery date: 040120.     Medication will be delivered via UPS to the home address in Epic WAM.    Damian Hofstra D Marqus Macphee   [facility] Specialty Technician

## 2018-10-13 MED FILL — HUMIRA PEN CITRATE FREE 40 MG/0.4 ML: 28 days supply | Qty: 8 | Fill #6 | Status: AC

## 2018-10-13 MED FILL — HUMIRA PEN CITRATE FREE 40 MG/0.4 ML: 28 days supply | Qty: 8 | Fill #6

## 2018-10-14 ENCOUNTER — Ambulatory Visit: Admit: 2018-10-14 | Discharge: 2018-10-15 | Payer: MEDICAID

## 2018-10-14 DIAGNOSIS — Z79899 Other long term (current) drug therapy: Secondary | ICD-10-CM

## 2018-10-14 DIAGNOSIS — L732 Hidradenitis suppurativa: Principal | ICD-10-CM

## 2018-10-14 MED ORDER — HYDROQUINONE 4 % TOPICAL CREAM
Freq: Two times a day (BID) | TOPICAL | 3 refills | 0.00000 days | Status: CP
Start: 2018-10-14 — End: 2018-10-14

## 2018-10-14 MED ORDER — HYDROQUINONE 4 % TOPICAL CREAM: g | Freq: Two times a day (BID) | 3 refills | 0 days | Status: AC

## 2018-10-14 NOTE — Unmapped (Signed)
ASSESSMENT/PLAN:    Hidradenitis Suppurativa; severe Hurley 3     1. We discussed the typical natural history, pathogenesis, treatment options, and expected course as well as the relapsing and sometimes recalcitrant nature of the disease.      Overall improving after large WLE in bilateral axilla, and medical management with Humira.     Given difficulty with travel will hold on Remicade for now, but may continue in the future.     Would do well with surgical treatment of disease on buttocks moving forward (strongly prefers general anesthesia to procedures in clinic so will refer to plastic surgery    Continue Humira 80mg  weekly subcu for treatment of hidradenitis.  Discussed risks of tuberculosis, other uncommon infections, theoretical risk of malignancies, and other uncommon side effects.    Quant gold recheck today   She has a history of treated hepatitis C (glecaprevir/pibrentas course early 2018 with undetectable viral load since then).      Hydroquinone 4% cream twice daily for 12 weeks for PIH associated with disease      Pain control:   Discussed that we cannot provide opiates long-term for pain control, and that ultimately goal is better control with disease.   -ibuprofen 800 mg every 8-12 hours as needed for pain  -Continue gabapentin, prescribed by her primary doctor  -Declines referral to pain clinic      RTC: 15m     Discussed case with Dr. Janyth Contes     SUBJECTIVE:    CC: Hidradenitis Suppurativa    Here for follow-up on hidradenitis suppurativa last seen 04/2018 by Pamala Hurry. Has been doing okay on Humira 80mg  weekly, but still has persistent area on the buttocks that drains and is painful constantly.  Less frequent axillary and inframammary flares.   Not currently on any antibiotics.      Disease course:  Year when symptoms first noticed: 46  Year of diagnosis: 24  Who diagnosed you? Dermatologist  Location of first symptoms: groin, axillae, inframammary, inner thighs and buttocks  Typical involved areas include: groin, axillae, inframammary, inner thighs and buttocks  Typical number of inflammatory lesions each month at baseline (from first visit): 5-10  Disease triggers: not sure    Are menstrual cycles irregular when not on birth control? No.  Current form of contraception: none  Effect of hormonal contraception on disease: no change  Flaring with menstrual cycle (before, during, or after?): no relationship  Difficulty becoming pregnancy? No.  Pregnancy complications? none  How many children? 3  Better or worse with pregnancy?  no change.  f    FH:      Patient Mother Father Son Daughter Brother Sister Maternal Grandmother Maternal Grandfather Paternal Grandmother Paternal Grandfather Maternal Aunt Maternal Uncle Paternal Aunt Paternal Uncle Other:   Derm Hidradenitis Suppurativa   x                                   Pilonidal sinus                                     Acne                                      Dissecting cellulitis(Scalp)  Eczema                                     Allergies                    Rheum Joint pains   x                                   SAPHO                                     Pyoderma gangrenosum                                     Back pain  x                                  Auto-immune disease                                     Asthma  x                  Endo Polycystic ovarian syndrome                                     Thyroid disease                    Vitamin D deficiency                   Psych Anxiety                                     Depression  x                                   Dementia                                     Suicidal thoughts*                                   Cardio Hypertension                                     High cholesterol                                     Heart attack  Stroke                                     Hem-onc Cancer  ______________ Anemia                    GI IBD (UC/Crohn's)                                   ID HIV                     Syphilis                     Other                         Social History:  Current or former smoker? former  Amount smoking: less than half ppd  How many years: ?  ED visits in the last 5 years? never  Difficulty affording medications? never  Marital Status: in relationship  Living with some one? Yes.    Prior treatments:  Topical: topical abx  Systemic: Doxy, PCN, keflex (none helpful)   Past surgical procedures: I&Ds  Past laser procedures: none      ROS: the balance of 10 systems is negative unless otherwise documented      OBJECTIVE:   Gen: Well-appearing patient, appropriate, interactive, in no acute distress  Skin: Examination of the scalp, face, neck, chest, back, abdomen, bilateral upper and lower extremities, hands, palms, soles, nails, buttocks, and external genitalia performed today and pertinent for:     location Abscess Inflamed nodule Non-inflamed nodule Draining sinus Non-draining Sinus Hurley % scar   R axilla                90   L axilla     1         80   R inframammary          L inframammary     1       Intermammary     3       Pubic               R inguinal             R thigh           L inguinal               L thigh          Scrotum/Vulva   1        Perianal          R buttock             L buttock   3  2  2      Other (list)                      AN count (total sum of abscess and inflammatory nodule):5  Pilonidal sinus (Y/N, or previously treated)? No.  Approximate BSA involved by inflammatory lesions: 1%  Intertriginous comedones: few  Diffuse comedones (trunk, face, etc): none  Acne scars: facial  Cribriform scarring: Yes.  Intertrigionus epidermal inclusion cysts: 1-3  Diffuse (trunk, feace, extremities) epidermal inclusion cysts: 1-3  -sites not commented on demonstrate normal findings.

## 2018-10-14 NOTE — Unmapped (Signed)
You are welcome to join the [facility] Support group online at [email or web address] or in-person at our regular meetings.  You can textHS to [id number] for meeting reminders or join the group online for regular updates.  This can be a great opportunity to interact and learn from other patients and help work with the HS community.  We hope to see your there!    Hidradentis Suppurativa (pronounced ???high-drad-en-eye-tis/sup-your-uh-tee-vah???) is a chronic disease of hair follicles.  The lesions occur most commonly on areas of skin-to-skin contact: under the arms (axillary area), in the groin, around the buttocks, in the region around the anus and genitals, and on the skin between and under the breasts. In women, the underarms, groin, and breast areas are most commonly affected. Men most often have HS lesions around the anus and under the arms and may also have HS at the back of the neck and behind and around the ears.    What does HS look and feel like?   The first thing that someone with HS notices is a tender, raised, red bump that looks like an under-the-skin pimple or boil. Sometimes HS lesions have two or more ???heads.???  In mild disease only an occasional boil or abscess may occur, but in more active disease there can be many new lesions every month.  Some abscesses can become larger and may open and drain pus.  Bleeding and increased odor can also occur. In severe disease, deeper abscesses develop and may connect with each other under the skin to form tunnel-like tracts (sinuses, fistulas).  These may drain constantly, or may temporarily improve and then usually begin draining again over time.  In people who have had sinus tracts for some time, scars form that feel like ropes under the skin. In the very worst cases, networks of sinus tracts can form deeper in the body, including the muscle and other tissues. Many people with severe HS have scars that can limit their ability to freely move their arms or legs, though this is very unlikely for most patients.     Clinicians usually classify or ???grade??? HS using the Hurley staging system according to the severity of the disease for each body location:  ??? Hurley stage I: one or more abscesses are present, but no sinus tracts have formed and no scars have developed  ??? Hurley stage II: one or more abscesses are present that resolve and recur; on sinus tract can be present and scarring is seen  ??? Hurley stage III: many abscesses and more than one sinus tract is present with extensive scars.    What causes HS?  The cause of HS is not completely understood.  It seems to be a disorder of hair follicles and often many family members are affected so genetics probably play a strong role.  Bacteria are often present and may make the disease worse, but infection does not seem to be the main cause. Hormones are also likely play a role since the condition typically starts around puberty when hair follicles under the arms and in the groin start to change.  It can sometimes flare with menstrual cycles in women as well.  In most cases it lasts for decades and starts to improve to some extent in the late 30s and 40s as long as many fistulas have not already formed.  Women are three times more likely than men to develop HS.    Other factors are known to contribute to HS   flaring or becoming worse, though they are likely not the main causes. The factors most commonly associated with HS include:  ??? Cigarette smoking - this is very highly linked.  Stopping smoking will likely not cure the disease, but likely is helpful in reducing how much and how often it flares.  ??? Obesity - HS may occur even in people that are not overweight, but it is much more common in patients that are.  There is some evidence that losing weight and eating a diet low in sugars and fats may be helpful in improving hidradenitis, though this is not helpful for everyone.  Working with a nutritionist may be an important way to help with this and is something your physician can help coordinate    Hidradenitis is not contagious.  It is not caused by a problem with personal hygiene or any other activity or behavior of those with the disease.    How can your doctor help you treat your hidradenitis?  Clinicians use both medication and surgery to treat HS. The choice of treatment???or combination of treatments???is made according to an individual patient???s needs. Clinicians consider several factors in determining the most appropriate plan for therapy:  ??? Severity of disease - medications and some laser treatments are usually able to control disease best when fistulas are not present.  Fistulas typically require surgery.  ??? Extent and location of disease  ??? Chronicity (how often the lesions recur)    A number of different surgical methods have been developed that are useful for certain patients under particular circumstances. These can be done with local numbing and healing at home for some areas when disease is not too extensive with relatively brief recovery times.  In more extensive disease there may be a need for larger excisions under general anesthesia with healing time in the hospital and prolonged recovery periods for better disease control.      In addition, many medical treatments have been tried???some with more success than others. No medication is effective for all patients, and you and your doctor may have to try several different agents or combinations of agents before you find the treatment plan that works best for you.  The goals of therapy with medications that are either topical (used on the skin) or systemic (taken by mouth) are:  1. to clear the lesions or at least reduce their number and extent, and  2. to prevent new lesions from forming.  3. To reduce pain, drainage, and odor  Some of the types of medications commonly used are antibacterial skin washes and the topical antibiotics to prevent secondary infections and corticosteroid injections into the lesions to reduce inflammation.     Other medications that may be used include retinoids (similar to Accutane), drugs that effect how hormones and hair follicles interact, drugs that affect your immune system (such as methotrexate, adalimumab/Humira, and Remicaid/infliximab), steroids, and oral antibiotics.    Lasers that destroy hair follicles can also be helpful since they reduce the hair follicles that cause the problems.  Multiple treatments are typically required over time and there is some discomfort associated with treatment, but it is typically very fast and well-tolerated.    It is very important to realize that hidradenitis cannot be completely cured with any single medication or surgical procedure.  It is a disease that can be very stubborn and difficult to control, but with good treatment a lot of improvement and sometimes temporary remissions can be obtained. Poorly controlled disease can cause   more fistulas to form and make managing the disease much more difficult over time so it is important to seek care to reduce major flares.  Surgery can provide a long term cure in some areas, though the disease can start again or continue in nearby areas.  A dermatologist is often the best person to help coordinate disease treatment, and sometimes other surgeons, pain specialists, other specialists, and nutritionists may be part of the treatment team.    What can you do to help your HS?  1. If your are a smoker, then stopping can probably be helpful.  Your dermatologist will be happy to refer you to some one who can help with this.  2. Follow a healthy diet and try to achieve a healthy weight  Some other self-help measures are:  ??? Keep your skin cool and dry (becoming overheated and sweating can contribute to an HS flare)  ??? To reduce the pain of cysts or nodules or to help them to drain, apply hot compresses or soak in hot water for 10 minutes at a time (use a clean washcloth or a teabag soaked in hot water)  ??? For female patients, cotton underwear that does not have tight elastic in the groin can be helpful.  Boyshort, brief, or boxer style underwear may be a better option as friction on hair follicles in affected areas can be a major trigger in some patients.  These can be easily found on [place] or with some retailers.  Fruit of the Loom and Underworks are two brands that are sometimes recommended.    Finally, know that you are not alone. Coping with the pain and other symptoms of HS can be very difficult, so it may be helpful to connect with others who live with HS. Patient groups and networks can be sources of important information and support. Some internet resources for information and connections are provided below.  Resources for Information    The Hidradenitis Suppurativa Foundation: A nonprofit organized by a group of physicians interested in treating and advancing research in hidradenitis suppurativa    American Academy of Dermatology  ARanked.fi    [employer]  ElevatorPitchers.de.html  [employer]ORD: [employer]  https://www.rarediseases.org/rare-disease-information/rare-diseases/byID/358/viewAbstract  Trials of new medications for HS  https://www.clinicaltrials.gov

## 2018-11-02 NOTE — Unmapped (Signed)
Elizabeth Browning reports doing well - recent visit with Dr. Janyth Contes, and they discusses potential future surgery/Remicade infusions. When asked about side effects, she noted joint pains in hips and ankles - not swollen, not hot, and constant pain for the last month or so. She denied family history of arthritis. I advised she follow up with her PCP if this continues.     [facility] Clinical Assessment & Refill Coordination Note    Elizabeth Browning, DOB: 1966-03-07  Phone: [phone] (home)     All above HIPAA information was verified with patient.     Specialty Medication(s):   Inflammatory Disorders: Humira     Current Outpatient Medications   Medication Sig Dispense Refill   ??? ADALIMUMAB PEN CITRATE FREE 40 MG/0.4 ML Inject the contents of 2 pens (80mg ) under the skin every 7 days as directed by prescriber 8 each 11   ??? albuterol (PROVENTIL HFA;VENTOLIN HFA) 90 mcg/actuation inhaler Inhale 2 puffs every six (6) hours as needed for wheezing.     ??? citalopram (CELEXA) 20 MG tablet Take 20 mg by mouth.     ??? clindamycin (CLEOCIN) 300 MG capsule Take 1 capsule by mouth twice a day (Patient not taking: Reported on 10/14/2018) 60 capsule 1   ??? empty container Misc use as directed 1 each 2   ??? fluticasone (FLONASE) 50 mcg/actuation nasal spray 1 spray by Each Nare route daily. 16 g 0   ??? fluticasone-salmeterol (ADVAIR HFA) 230-21 mcg/actuation inhaler Inhale 2 puffs Two (2) times a day.     ??? gabapentin (NEURONTIN) 100 MG capsule Take 100 mg by mouth.     ??? HYDROcodone-acetaminophen (NORCO) 5-325 mg per tablet Take 1 tablet by mouth every six (6) hours as needed for pain. Short term for HS flare pain. 15 tablet 0   ??? hydroquinone 4 % cream Apply topically Two (2) times a day. Use for up to 12 weeks 30 g 3   ??? ibuprofen (ADVIL,MOTRIN) 800 MG tablet Take 1 tablet (800 mg total) by mouth every eight (8) hours as needed for pain. 90 tablet 1   ??? ipratropium-albuterol (COMBIVENT) 20-100 mcg/actuation Mist inhaler Inhale.     ??? ipratropium-albuterol (DUO-NEB) 0.5-2.5 mg/3 mL nebulizer Inhale 3 mL by nebulization every four (4) hours as needed.      ??? rifAMPin (RIFADIN) 300 MG capsule Take 1 capsule (300mg ) twice a day. 60 capsule 1   ??? spironolactone (ALDACTONE) 50 MG tablet Take 1 tablet (50 mg total) by mouth daily. 90 tablet 3   ??? VOLTAREN 1 % gel APP 2 TO 4 GRAMS BID OR QID PRF NECK PAIN  0   ??? VRAYLAR 3 mg cap capsule TK ONE C PO D  0     No current facility-administered medications for this visit.         Changes to medications: Elizabeth Browning reports no changes at this time.    Allergies   Allergen Reactions   ??? Codeine Rash, Hives and Itching   ??? Buprenorphine Hcl      Unknown-pt denies   ??? Adhesive Rash   ??? Iodinated Contrast Media Rash   ??? Latex Rash   ??? Morphine Itching   ??? Opioids - Morphine Analogues Itching     Unknown-pt denies         Changes to allergies: No    SPECIALTY MEDICATION ADHERENCE     Humira - 2 weeks remaining  Medication Adherence    Patient  reported X missed doses in the last month:  0  Specialty Medication:  Humira          Specialty medication(s) dose(s) confirmed: Regimen is correct and unchanged.     Are there any concerns with adherence? No    Adherence counseling provided? Not needed    CLINICAL MANAGEMENT AND INTERVENTION      Clinical Benefit Assessment:    Do you feel the medicine is effective or helping your condition? Yes    Clinical Benefit counseling provided? Not needed    Adverse Effects Assessment:    Are you experiencing any side effects? No    Are you experiencing difficulty administering your medicine? No    Quality of Life Assessment:    How many days over the past month did your HS  keep you from your normal activities? For example, brushing your teeth or getting up in the morning. 0    Have you discussed this with your provider? Not needed    Therapy Appropriateness:    Is therapy appropriate? Yes, therapy is appropriate and should be continued DISEASE/MEDICATION-SPECIFIC INFORMATION      For patients on injectable medications: Patient currently has 2 doses left.  Next injection is scheduled for Friday, April 24.    PATIENT SPECIFIC NEEDS     ? Does the patient have any physical, cognitive, or cultural barriers? No    ? Is the patient high risk? No     ? Does the patient require a Care Management Plan? No     ? Does the patient require physician intervention or other additional services (i.e. nutrition, smoking cessation, social work)? No      SHIPPING     Specialty Medication(s) to be Shipped:   Inflammatory Disorders: Humira    Other medication(s) to be shipped: na     Changes to insurance: No    Delivery Scheduled: Yes, Expected medication delivery date: Friday, May 1.     Medication will be delivered via UPS to the confirmed home address in [facility].    The patient will receive a drug information handout for each medication shipped and additional FDA Medication Guides as required.  Verified that patient has previously received a Conservation officer, historic buildings.    Lanney Gins   [facility] Specialty Pharmacist

## 2018-11-12 MED FILL — HUMIRA PEN CITRATE FREE 40 MG/0.4 ML: 28 days supply | Qty: 8 | Fill #7 | Status: AC

## 2018-11-12 MED FILL — HUMIRA PEN CITRATE FREE 40 MG/0.4 ML: 28 days supply | Qty: 8 | Fill #7

## 2018-12-03 NOTE — Unmapped (Signed)
[facility] Refill Coordination Note    Specialty Medication(s) to be Shipped:   Inflammatory Disorders: Humira    Other medication(s) to be shipped: na     Elizabeth Browning, DOB: Jun 11, 1966  Phone: [phone] (home)       All above HIPAA information was verified with patient.     Completed refill call assessment today to schedule patient's medication shipment from the [facility] [phone]).       Specialty medication(s) and dose(s) confirmed: Regimen is correct and unchanged.   Changes to medications: Amarachi reports no changes at this time.  Changes to insurance: No  Questions for the pharmacist: No    Confirmed patient received Welcome Packet with first shipment. The patient will receive a drug information handout for each medication shipped and additional FDA Medication Guides as required.       DISEASE/MEDICATION-SPECIFIC INFORMATION        N/A    SPECIALTY MEDICATION ADHERENCE     Medication Adherence    Patient reported X missed doses in the last month:  0  Specialty Medication:  Humira CF 40 mg/0.4 ml  Patient is on additional specialty medications:  No  Patient is on more than two specialty medications:  No  Any gaps in refill history greater than 2 weeks in the last 3 months:  no  Demonstrates understanding of importance of adherence:  yes  Informant:  patient  Reliability of informant:  reliable  Confirmed plan for next specialty medication refill:  delivery by pharmacy  Refills needed for supportive medications:  not needed                Humira CF 40 mg/0.4 ml. Patient has 4 pens remaining      SHIPPING     Shipping address confirmed in Epic.     Delivery Scheduled: Yes, Expected medication delivery date: 052920.     Medication will be delivered via UPS to the home address in Epic WAM.    Matraca Hunkins D Kamen Hanken   [facility] Specialty Technician

## 2018-12-10 MED FILL — HUMIRA PEN CITRATE FREE 40 MG/0.4 ML: 28 days supply | Qty: 8 | Fill #8 | Status: AC

## 2018-12-10 MED FILL — HUMIRA PEN CITRATE FREE 40 MG/0.4 ML: 28 days supply | Qty: 8 | Fill #8

## 2018-12-31 NOTE — Unmapped (Signed)
[facility] Refill Coordination Note    Specialty Medication(s) to be Shipped:   Inflammatory Disorders: Humira    Other medication(s) to be shipped: na     Elizabeth Browning, DOB: 25-Apr-1966  Phone: [phone] (home)       All above HIPAA information was verified with patient.     Completed refill call assessment today to schedule patient's medication shipment from the [facility] [phone]).       Specialty medication(s) and dose(s) confirmed: Regimen is correct and unchanged.   Changes to medications: Elizabeth Browning reports no changes at this time.  Changes to insurance: No  Questions for the pharmacist: No    Confirmed patient received Welcome Packet with first shipment. The patient will receive a drug information handout for each medication shipped and additional FDA Medication Guides as required.       DISEASE/MEDICATION-SPECIFIC INFORMATION        N/A    SPECIALTY MEDICATION ADHERENCE     Medication Adherence    Patient reported X missed doses in the last month:  0  Specialty Medication:  Humira CF 40 mg/0.4 ml  Patient is on additional specialty medications:  No  Patient is on more than two specialty medications:  No  Any gaps in refill history greater than 2 weeks in the last 3 months:  no  Demonstrates understanding of importance of adherence:  yes  Informant:  patient  Reliability of informant:  reliable  Confirmed plan for next specialty medication refill:  delivery by pharmacy  Refills needed for supportive medications:  not needed              Humira CF 40 mg/0.4 ml. 4 pens remaining. Takes on Fridays. Has dose for this week and next week      SHIPPING     Shipping address confirmed in Epic.     Delivery Scheduled: Yes, Expected medication delivery date: 062520.     Medication will be delivered via UPS to the home address in Epic WAM.    Elizabeth Browning   [facility] Specialty Technician

## 2019-01-06 MED FILL — HUMIRA PEN CITRATE FREE 40 MG/0.4 ML: 28 days supply | Qty: 8 | Fill #9

## 2019-01-06 MED FILL — HUMIRA PEN CITRATE FREE 40 MG/0.4 ML: 28 days supply | Qty: 8 | Fill #9 | Status: AC

## 2019-01-28 NOTE — Unmapped (Signed)
[facility] Refill Coordination Note    Specialty Medication(s) to be Shipped:   Inflammatory Disorders: Humira    Other medication(s) to be shipped: na     Elizabeth Browning, DOB: [birth year]  Phone: [phone] (home)       All above HIPAA information was verified with patient.     Completed refill call assessment today to schedule patient's medication shipment from the [facility] [phone]).       Specialty medication(s) and dose(s) confirmed: Regimen is correct and unchanged.   Changes to medications: Carroll reports no changes at this time.  Changes to insurance: No  Questions for the pharmacist: No    Confirmed patient received Welcome Packet with first shipment. The patient will receive a drug information handout for each medication shipped and additional FDA Medication Guides as required.       DISEASE/MEDICATION-SPECIFIC INFORMATION        N/A    SPECIALTY MEDICATION ADHERENCE     Medication Adherence    Patient reported X missed doses in the last month: 0  Specialty Medication: Humira CF 40 mg/0.4 ml  Patient is on additional specialty medications: No  Patient is on more than two specialty medications: No  Any gaps in refill history greater than 2 weeks in the last 3 months: no  Demonstrates understanding of importance of adherence: yes  Informant: patient  Reliability of informant: reliable  Confirmed plan for next specialty medication refill: delivery by pharmacy  Refills needed for supportive medications: not needed              Humira CF 40 mg/0.4 ml. 4 pens on hand for 7/17 and 7/24 doses      SHIPPING     Shipping address confirmed in Epic.     Delivery Scheduled: Yes, Expected medication delivery date: 072220.     Medication will be delivered via UPS to the home address in Epic WAM.    Devontay Celaya D Cledith Abdou   [facility] Specialty Technician

## 2019-01-29 MED ORDER — HYDROQUINONE 4 % TOPICAL CREAM
Freq: Two times a day (BID) | TOPICAL | 3 refills | 0.00000 days | Status: SS
Start: 2019-01-29 — End: 2019-03-15

## 2019-01-29 NOTE — Unmapped (Signed)
Called pt to triage. Pt asked for the Hydroquinone cream to be resent to pharmacy because she never picked up medication. Pharmacy on file is [facility] in [place].     [place], [place]

## 2019-02-01 ENCOUNTER — Ambulatory Visit: Admit: 2019-02-01 | Discharge: 2019-02-02 | Payer: MEDICAID

## 2019-02-01 DIAGNOSIS — L910 Hypertrophic scar: Secondary | ICD-10-CM

## 2019-02-01 DIAGNOSIS — L732 Hidradenitis suppurativa: Secondary | ICD-10-CM

## 2019-02-01 DIAGNOSIS — Z79899 Other long term (current) drug therapy: Principal | ICD-10-CM

## 2019-02-01 MED ORDER — HYDROQUINONE 4 % TOPICAL CREAM: g | Freq: Two times a day (BID) | 3 refills | 0 days | Status: SS

## 2019-02-01 MED ORDER — HYDROQUINONE 4 % TOPICAL CREAM
Freq: Two times a day (BID) | TOPICAL | 3 refills | 0.00000 days | Status: CP
Start: 2019-02-01 — End: 2019-02-01

## 2019-02-01 NOTE — Unmapped (Signed)
You are welcome to join the [facility] Support group online at [email or web address] or in-person at our regular meetings.  You can textHS to [id number] for meeting reminders or join the group online for regular updates.  This can be a great opportunity to interact and learn from other patients and help work with the HS community.  We hope to see your there!    Hidradentis Suppurativa (pronounced ???high-drad-en-eye-tis/sup-your-uh-tee-vah???) is a chronic disease of hair follicles.  The lesions occur most commonly on areas of skin-to-skin contact: under the arms (axillary area), in the groin, around the buttocks, in the region around the anus and genitals, and on the skin between and under the breasts. In women, the underarms, groin, and breast areas are most commonly affected. Men most often have HS lesions around the anus and under the arms and may also have HS at the back of the neck and behind and around the ears.    What does HS look and feel like?   The first thing that someone with HS notices is a tender, raised, red bump that looks like an under-the-skin pimple or boil. Sometimes HS lesions have two or more ???heads.???  In mild disease only an occasional boil or abscess may occur, but in more active disease there can be many new lesions every month.  Some abscesses can become larger and may open and drain pus.  Bleeding and increased odor can also occur. In severe disease, deeper abscesses develop and may connect with each other under the skin to form tunnel-like tracts (sinuses, fistulas).  These may drain constantly, or may temporarily improve and then usually begin draining again over time.  In people who have had sinus tracts for some time, scars form that feel like ropes under the skin. In the very worst cases, networks of sinus tracts can form deeper in the body, including the muscle and other tissues. Many people with severe HS have scars that can limit their ability to freely move their arms or legs, though this is very unlikely for most patients.     Clinicians usually classify or ???grade??? HS using the Hurley staging system according to the severity of the disease for each body location:  ??? Hurley stage I: one or more abscesses are present, but no sinus tracts have formed and no scars have developed  ??? Hurley stage II: one or more abscesses are present that resolve and recur; on sinus tract can be present and scarring is seen  ??? Hurley stage III: many abscesses and more than one sinus tract is present with extensive scars.    What causes HS?  The cause of HS is not completely understood.  It seems to be a disorder of hair follicles and often many family members are affected so genetics probably play a strong role.  Bacteria are often present and may make the disease worse, but infection does not seem to be the main cause. Hormones are also likely play a role since the condition typically starts around puberty when hair follicles under the arms and in the groin start to change.  It can sometimes flare with menstrual cycles in women as well.  In most cases it lasts for decades and starts to improve to some extent in the late 30s and 40s as long as many fistulas have not already formed.  Women are three times more likely than men to develop HS.    Other factors are known to contribute to HS   flaring or becoming worse, though they are likely not the main causes. The factors most commonly associated with HS include:  ??? Cigarette smoking - this is very highly linked.  Stopping smoking will likely not cure the disease, but likely is helpful in reducing how much and how often it flares.  ??? Obesity - HS may occur even in people that are not overweight, but it is much more common in patients that are.  There is some evidence that losing weight and eating a diet low in sugars and fats may be helpful in improving hidradenitis, though this is not helpful for everyone.  Working with a nutritionist may be an important way to help with this and is something your physician can help coordinate    Hidradenitis is not contagious.  It is not caused by a problem with personal hygiene or any other activity or behavior of those with the disease.    How can your doctor help you treat your hidradenitis?  Clinicians use both medication and surgery to treat HS. The choice of treatment???or combination of treatments???is made according to an individual patient???s needs. Clinicians consider several factors in determining the most appropriate plan for therapy:  ??? Severity of disease - medications and some laser treatments are usually able to control disease best when fistulas are not present.  Fistulas typically require surgery.  ??? Extent and location of disease  ??? Chronicity (how often the lesions recur)    A number of different surgical methods have been developed that are useful for certain patients under particular circumstances. These can be done with local numbing and healing at home for some areas when disease is not too extensive with relatively brief recovery times.  In more extensive disease there may be a need for larger excisions under general anesthesia with healing time in the hospital and prolonged recovery periods for better disease control.      In addition, many medical treatments have been tried???some with more success than others. No medication is effective for all patients, and you and your doctor may have to try several different agents or combinations of agents before you find the treatment plan that works best for you.  The goals of therapy with medications that are either topical (used on the skin) or systemic (taken by mouth) are:  1. to clear the lesions or at least reduce their number and extent, and  2. to prevent new lesions from forming.  3. To reduce pain, drainage, and odor  Some of the types of medications commonly used are antibacterial skin washes and the topical antibiotics to prevent secondary infections and corticosteroid injections into the lesions to reduce inflammation.     Other medications that may be used include retinoids (similar to Accutane), drugs that effect how hormones and hair follicles interact, drugs that affect your immune system (such as methotrexate, adalimumab/Humira, and Remicaid/infliximab), steroids, and oral antibiotics.    Lasers that destroy hair follicles can also be helpful since they reduce the hair follicles that cause the problems.  Multiple treatments are typically required over time and there is some discomfort associated with treatment, but it is typically very fast and well-tolerated.    It is very important to realize that hidradenitis cannot be completely cured with any single medication or surgical procedure.  It is a disease that can be very stubborn and difficult to control, but with good treatment a lot of improvement and sometimes temporary remissions can be obtained. Poorly controlled disease can cause   more fistulas to form and make managing the disease much more difficult over time so it is important to seek care to reduce major flares.  Surgery can provide a long term cure in some areas, though the disease can start again or continue in nearby areas.  A dermatologist is often the best person to help coordinate disease treatment, and sometimes other surgeons, pain specialists, other specialists, and nutritionists may be part of the treatment team.    What can you do to help your HS?  1. If your are a smoker, then stopping can probably be helpful.  Your dermatologist will be happy to refer you to some one who can help with this.  2. Follow a healthy diet and try to achieve a healthy weight  Some other self-help measures are:  ??? Keep your skin cool and dry (becoming overheated and sweating can contribute to an HS flare)  ??? To reduce the pain of cysts or nodules or to help them to drain, apply hot compresses or soak in hot water for 10 minutes at a time (use a clean washcloth or a teabag soaked in hot water)  ??? For female patients, cotton underwear that does not have tight elastic in the groin can be helpful.  Boyshort, brief, or boxer style underwear may be a better option as friction on hair follicles in affected areas can be a major trigger in some patients.  These can be easily found on [place] or with some retailers.  Fruit of the Loom and Underworks are two brands that are sometimes recommended.    Finally, know that you are not alone. Coping with the pain and other symptoms of HS can be very difficult, so it may be helpful to connect with others who live with HS. Patient groups and networks can be sources of important information and support. Some internet resources for information and connections are provided below.  Resources for Information    The Hidradenitis Suppurativa Foundation: A nonprofit organized by a group of physicians interested in treating and advancing research in hidradenitis suppurativa    American Academy of Dermatology  ARanked.fi    [employer]  ElevatorPitchers.de.html  [employer]ORD: [employer]  https://www.rarediseases.org/rare-disease-information/rare-diseases/byID/358/viewAbstract  Trials of new medications for HS  https://www.clinicaltrials.gov

## 2019-02-01 NOTE — Unmapped (Signed)
ASSESSMENT/PLAN:    Hidradenitis Suppurativa; severe Hurley 3     1. We discussed the typical natural history, pathogenesis, treatment options, and expected course as well as the relapsing and sometimes recalcitrant nature of the disease.      Overall improving after large WLE in bilateral axilla, and medical management with Humira.     Given fear around IVs, hold Remicade for now, but may continue in the future.     Will see plastic surgery in the next couple of weeks to discuss excision of recurrent sinuses on buttocks.  Calm today, but given recurrences over time within some of the affected areas would do best long term with excisions.    Continue Humira 80mg  weekly subcu for treatment of hidradenitis - overall relatively well-controlled.  Discussed risks of tuberculosis, other uncommon infections, theoretical risk of malignancies, and other uncommon side effects.    Quant gold recheck today   She has a history of treated hepatitis C (glecaprevir/pibrentas course early 2018 with undetectable viral load since then).      Hydroquinone 4% cream twice daily for 12 weeks for PIH associated with disease      Pain control:   Discussed that we cannot provide opiates long-term for pain control, and that ultimately goal is better control with disease.   -ibuprofen 800 mg every 8-12 hours as needed for pain  -Continue gabapentin, prescribed by her primary doctor  -Declines referral to pain clinic      RTC: 44m     Discussed case with Dr. Janyth Contes     SUBJECTIVE:    CC: Hidradenitis Suppurativa    Here for follow-up on hidradenitis suppurativa last seen 10/2018.  Has been doing okay on Humira 80mg  weekly, but still has persistent area on the buttocks that drains and is painful constantly.  Less frequent axillary and inframammary flares.   Not currently on any antibiotics.  Planning to see plastics to discuss excision on buttocks next week.      Disease course:  Year when symptoms first noticed: 65  Year of diagnosis: 24  Who diagnosed you? Dermatologist  Location of first symptoms: groin, axillae, inframammary, inner thighs and buttocks  Typical involved areas include: groin, axillae, inframammary, inner thighs and buttocks  Typical number of inflammatory lesions each month at baseline (from first visit): 5-10  Disease triggers: not sure    Are menstrual cycles irregular when not on birth control? No.  Current form of contraception: none  Effect of hormonal contraception on disease: no change  Flaring with menstrual cycle (before, during, or after?): no relationship  Difficulty becoming pregnancy? No.  Pregnancy complications? none  How many children? 3  Better or worse with pregnancy?  no change.  f    FH:      Patient Mother Father Son Daughter Brother Sister Maternal Grandmother Maternal Grandfather Paternal Grandmother Paternal Grandfather Maternal Aunt Maternal Uncle Paternal Aunt Paternal Uncle Other:   Derm Hidradenitis Suppurativa   x                                   Pilonidal sinus                                     Acne  Dissecting cellulitis(Scalp)                                     Eczema                                     Allergies                    Rheum Joint pains   x                                   SAPHO                                     Pyoderma gangrenosum                                     Back pain  x                                  Auto-immune disease                                     Asthma  x                  Endo Polycystic ovarian syndrome                                     Thyroid disease                    Vitamin D deficiency                   Psych Anxiety                                     Depression  x                                   Dementia                                     Suicidal thoughts*                                   Cardio Hypertension                                     High cholesterol  Heart attack Stroke                                     Hem-onc Cancer  ______________                                    Anemia                    GI IBD (UC/Crohn's)                                   ID HIV                     Syphilis                     Other                         Social History:  Current or former smoker? former  Amount smoking: less than half ppd  How many years: ?  ED visits in the last 5 years? never  Difficulty affording medications? never  Marital Status: in relationship  Living with some one? Yes.    Prior treatments:  Topical: topical abx  Systemic: Doxy, PCN, keflex (none helpful)   Past surgical procedures: I&Ds  Past laser procedures: none      ROS: the balance of 10 systems is negative unless otherwise documented      OBJECTIVE:   Gen: Well-appearing patient, appropriate, interactive, in no acute distress  Skin: Examination of the scalp, face, neck, chest, back, abdomen, bilateral upper and lower extremities, hands, palms, soles, nails, buttocks, and external genitalia performed today and pertinent for:     location Abscess Inflamed nodule Non-inflamed nodule Draining sinus Non-draining Sinus Hurley % scar   R axilla                90   L axilla              80   R inframammary          L inframammary            Intermammary            Pubic               R inguinal             R thigh           L inguinal               L thigh          Scrotum/Vulva   1        Perianal          R buttock             L buttock   1    3     Other (list)                      AN count (total sum of abscess and inflammatory nodule):2  Pilonidal sinus (Y/N, or previously treated)? No.  Approximate BSA involved by inflammatory lesions: 0.2%  Intertriginous comedones: few  Diffuse  comedones (trunk, face, etc): none  Acne scars: facial  Cribriform scarring: Yes.  Intertrigionus epidermal inclusion cysts: 1-3  Diffuse (trunk, feace, extremities) epidermal inclusion cysts: 1-3  Scarring folliculitis  -sites not commented on demonstrate normal findings.

## 2019-02-02 LAB — QUANTIFERON TB GOLD PLUS
QUANTIFERON ANTIGEN 1 MINUS NIL: 0.01 [IU]/mL
QUANTIFERON TB GOLD PLUS: NEGATIVE
QUANTIFERON TB NIL VALUE: 0.02 [IU]/mL

## 2019-02-02 LAB — TB AG2 VALUE: Lab: 0.03

## 2019-02-02 LAB — TB NIL VALUE: Lab: 0.02

## 2019-02-02 LAB — QUANTIFERON ANTIGEN 2 MINUS NIL: Lab: 0.01

## 2019-02-02 LAB — TB AG1 VALUE: Lab: 0.03

## 2019-02-02 LAB — TB MITOGEN VALUE: Lab: 4.9

## 2019-02-02 MED FILL — HUMIRA PEN CITRATE FREE 40 MG/0.4 ML: 28 days supply | Qty: 8 | Fill #10

## 2019-02-02 MED FILL — HUMIRA PEN CITRATE FREE 40 MG/0.4 ML: 28 days supply | Qty: 8 | Fill #10 | Status: AC

## 2019-02-08 ENCOUNTER — Ambulatory Visit: Admit: 2019-02-08 | Discharge: 2019-02-09 | Payer: MEDICAID

## 2019-02-08 DIAGNOSIS — L732 Hidradenitis suppurativa: Principal | ICD-10-CM

## 2019-02-08 NOTE — Unmapped (Signed)
Plastic Surgery  Clinic H&P Note      Patient Name: Elizabeth Browning  Patient Age: 53 y.o.  Encounter Date: 02/08/2019    Referring Physician: Elsie Stain, MD  [address]  [address]  [place],  [place] [address]  Primary Care Physician: Lilian Kapur, [place]        Chief Complaint: hidradenitis supperativa    Assessment:  Elizabeth Browning is a 53 y.o. female with Elizabeth Browning 3 HS to buttocks, breasts, groin, and mons/abdomen. She is now under excellent control with Humira and is ready for staged excision.        Recommendations/Plan:  - Consented in clinic for R buttock HS excision  - Will continue with staged excision after she heals        History of Present Illness:     Elizabeth Browning is a 53 y.o. female who presents for excision of HS to the buttocks.    She has an extensive hx of HS to the b/l buttocks, breasts, axillae, groins, mons, and abdomen. She was first diagnosed at age 35, and had a significant hx including extensive I&Ds, antibiotics, skin care. She did not have any significant relief and had a long-term pause in attempted treatments. She was then referred to Dr. Janyth Contes, who has put her on Humira with excellent response, with near-total resolution of flares.    At this point, she has extensive scarring and tunneling, and when she has flares these are the sites of recurrence. Dr. Janyth Contes has referred her to plastic surgery for evaluation of excision since her disease is now quiescent. She quit smoking 1 year ago as well, which has helped her symptoms improve.        Physical Exam  General: middle aged female in no acute distress  Neuro: A&Ox4, pain well controlled  Pulmonary: Normal respiratory effort.   Cardiovascular: Regular rate.   Extr: Warm/well perfused, no edema  Skin: b/l buttocks with Hurley Stage 3 HS, with no active purulent drainage. Disease is extremely superficial but extensive, extending across b/l buttocks from ASIS to ischium, between both greater trochanters. Medial disease is significantly worse.        Past Medical History:  Past Medical History:   Diagnosis Date   ??? Anesthesia to pain     difficulty relaxing and letting anesthesia work- anxiety noted   ??? Anxiety    ??? Arthritis    ??? Asthma    ??? Cancer (CMS-HCC)     cervical   ??? Chronic bronchitis (CMS-HCC)    ??? Chronic hepatitis C without hepatic coma (CMS-HCC)     cleared as of 10/25/16 per patient from [facility]   ??? Colon polyp    ??? Difficult intravenous access     requesting site to be numbed   ??? DOE (dyspnea on exertion)    ??? Dysthymia (RAF-HCC)    ??? Fatigue    ??? Foot pain    ??? GERD (gastroesophageal reflux disease) (RAF-HCC)    ??? Hepatitis C    ??? Loosening of knee joint prosthesis (CMS-HCC)     left   ??? Migraine    ??? MRSA (methicillin resistant staph aureus) culture positive     no + culture found- pt denies-current culture negative   ??? Neuropathic pain    ??? URI (upper respiratory infection)    ??? Vitamin D deficiency        Past Surgical History:  Past Surgical History:   Procedure  Laterality Date   ??? BUNIONECTOMY Bilateral    ??? CARPOMETACARPEL SUSPENSION PLASTY Bilateral     tendon repair   ??? HAND SURGERY Bilateral     trigger finger   ??? HYSTERECTOMY      partial   ??? JOINT REPLACEMENT Left     knee   ??? KNEE SURGERY Left     revision 8 months later post TKR   ??? MOUTH SURGERY      all teeth removed   ??? PR INCIS/DRAIN THIGH/KNEE ABSCESS,DEEP Left 11/11/2016    Procedure: I&D DEEP ABSCESS INFEC BURSA/HEMATOMA THIGH/KNEE;  Surgeon: Lowell Bouton, MD;  Location: OR [facility];  Service: Orthopedics   ??? PR REVISE KNEE JOINT REPLACE,ALL PARTS Left 11/04/2016    Procedure: REVISION LEFT TOTAL KNEE REPLACEMENT, FEMORAL AND TIBIAL COMPARTMENT;  Surgeon: Lowell Bouton, MD;  Location: OR [facility];  Service: Orthopedics   ??? TOE AMPUTATION Right     pinky    ??? TONSSILLECTOMY     ??? TUBAL LIGATION     ??? UMBILICAL HERNIA REPAIR      age 72       Medications:  Current Outpatient Medications   Medication Sig Dispense Refill   ??? ADALIMUMAB PEN CITRATE FREE 40 MG/0.4 ML Inject the contents of 2 pens (80mg ) under the skin every 7 days as directed by prescriber 8 each 11   ??? albuterol (PROVENTIL HFA;VENTOLIN HFA) 90 mcg/actuation inhaler Inhale 2 puffs every six (6) hours as needed for wheezing.     ??? citalopram (CELEXA) 20 MG tablet Take 20 mg by mouth.     ??? clindamycin (CLEOCIN) 300 MG capsule Take 1 capsule by mouth twice a day 60 capsule 1   ??? cyclobenzaprine (FLEXERIL) 10 MG tablet TK ONE TABLET Q 4 - 6 H AS NEEDED     ??? DEXILANT 60 mg capsule TK 1 C PO QAM FOR HEARTBURN AND ABDOMINAL PAIN     ??? doxycycline (VIBRA-TABS) 100 MG tablet Take 100 mg by mouth Two (2) times a day.     ??? empty container Misc use as directed 1 each 2   ??? fluconazole (DIFLUCAN) 100 MG tablet TK ONE T PO DAILY     ??? fluticasone-salmeterol (ADVAIR HFA) 230-21 mcg/actuation inhaler Inhale 2 puffs Two (2) times a day.     ??? hydroquinone 4 % cream Apply topically Two (2) times a day. Use for up to 12 weeks 30 g 3   ??? hydroquinone 4 % cream Apply topically Two (2) times a day. Use for up to 12 weeks 30 g 3   ??? ipratropium-albuterol (COMBIVENT) 20-100 mcg/actuation Mist inhaler Inhale.     ??? ipratropium-albuterol (DUO-NEB) 0.5-2.5 mg/3 mL nebulizer Inhale 3 mL by nebulization every four (4) hours as needed.      ??? metroNIDAZOLE (FLAGYL) 250 MG tablet Take 250 mg by mouth Two (2) times a day.     ??? omeprazole (PRILOSEC) 20 MG capsule TK 1 C PO BID     ??? ondansetron (ZOFRAN) 8 MG tablet TK 1 T PO TID     ??? predniSONE (DELTASONE) 5 mg DsPk FPD     ??? pregabalin (LYRICA) 75 MG capsule TK 1 C PO BID FOR LEG PAIN     ??? rifAMPin (RIFADIN) 300 MG capsule Take 1 capsule (300mg ) twice a day. 60 capsule 1   ??? spironolactone (ALDACTONE) 50 MG tablet Take 1 tablet (50 mg total) by mouth daily. 90 tablet 3   ???  traZODone (DESYREL) 50 MG tablet Take 50-100 mg by mouth nightly.     ??? VOLTAREN 1 % gel APP 2 TO 4 GRAMS BID OR QID PRF NECK PAIN  0   ??? VRAYLAR 3 mg cap capsule TK ONE C PO D 0   ??? fluticasone (FLONASE) 50 mcg/actuation nasal spray 1 spray by Each Nare route daily. 16 g 0   ??? gabapentin (NEURONTIN) 100 MG capsule Take 100 mg by mouth.     ??? HYDROcodone-acetaminophen (NORCO) 5-325 mg per tablet Take 1 tablet by mouth every six (6) hours as needed for pain. Short term for HS flare pain. (Patient not taking: Reported on 02/08/2019) 15 tablet 0   ??? ibuprofen (ADVIL,MOTRIN) 800 MG tablet Take 1 tablet (800 mg total) by mouth every eight (8) hours as needed for pain. (Patient not taking: Reported on 02/08/2019) 90 tablet 1     No current facility-administered medications for this visit.        Allergies:  is allergic to codeine; buprenorphine hcl; adhesive; iodinated contrast media; latex; morphine; and opioids - morphine analogues.    Family History:  Family History   Problem Relation Age of Onset   ??? Hypertension Mother    ??? Diabetes Mother    ??? Hypertension Father    ??? Cancer Father         Leukemia   ??? Cancer Sister         breast       Social History:   Tobacco use:  reports that she has quit smoking. Her smoking use included cigarettes. She has a 15.00 pack-year smoking history. She has never used smokeless tobacco.  Alcohol use:  reports no history of alcohol use.  Drug use:  reports no history of drug use.    Review of Systems: Remainder of a 10 system review of systems is negative.     Objective     Diagnostic Studies:   Results for orders placed or performed in visit on 02/01/19   Quantiferon TB Gold Plus   Result Value Ref Range    Quantiferon TB Gold Plus Interpretation Negative Negative    Quantiferon TB NIL value 0.02 IU/mL    Quantiferon Mitogen Minus Nil 4.88 IU/mL    Quantiferon Antigen 1 minus Nil 0.01 IU/mL    Quantiferon Antigen 2 minus NIL 0.01 IU/mL   Quant TB Nil value   Result Value Ref Range    TB NIL VALUE 0.02    Quant TB AG1 value   Result Value Ref Range    TB AG1 VALUE 0.03    Quant TB AG2 value   Result Value Ref Range    TB AG2 VALUE 0.03    Quant TB Mitogen value Result Value Ref Range    TB Mitogen 4.90        Imaging: None

## 2019-03-03 NOTE — Unmapped (Signed)
[facility] Specialty Pharm[facility] Refill Coordination Note    Specialty Medication(s) to be Shipped:   Inflammatory Disorders: Humira    Other medication(s) to be shipped: na     Elizabeth Browning, DOB: August 29, 1965  Phone: [phone] (home)       All above HIPAA information was verified with patient.     Completed refill call assessment today to schedule patient's medication shipment from the [facility] [phone]).       Specialty medication(s) and dose(s) confirmed: Regimen is correct and unchanged.   Changes to medications: Elizabeth Browning reports no changes at this time.  Changes to insurance: No  Questions for the pharmacist: No    Confirmed patient received Welcome Packet with first shipment. The patient will receive a drug information handout for each medication shipped and additional FDA Medication Guides as required.       DISEASE/MEDICATION-SPECIFIC INFORMATION        For patients on injectable medications: Patient currently has 1 doses left.  Next injection is scheduled for 082120.    SPECIALTY MEDICATION ADHERENCE     Medication Adherence    Patient reported X missed doses in the last month: 0  Specialty Medication: Humira CF 40 mg/0.4 ml  Patient is on additional specialty medications: No  Patient is on more than two specialty medications: No  Any gaps in refill history greater than 2 weeks in the last 3 months: no  Demonstrates understanding of importance of adherence: yes  Informant: patient  Reliability of informant: reliable  Confirmed plan for next specialty medication refill: delivery by pharmacy  Refills needed for supportive medications: not needed                Humira CF 40 mg/0.4 ml. 7 days on hand      SHIPPING     Shipping address confirmed in Epic.     Delivery Scheduled: Yes, Expected medication delivery date: 082520.     Medication will be delivered via UPS to the home address in Epic WAM.    Zorana Brockwell D Keisa Blow   [facility] Specialty Technician

## 2019-03-06 ENCOUNTER — Ambulatory Visit: Admit: 2019-03-06 | Discharge: 2019-03-07 | Payer: MEDICAID

## 2019-03-06 DIAGNOSIS — Z1159 Encounter for screening for other viral diseases: Principal | ICD-10-CM

## 2019-03-06 NOTE — Unmapped (Signed)
COVID Pre-Procedure Intake Form     Assessment     Elizabeth Browning is a 53 y.o. female presenting to [facility] for COVID testing.     Plan     If no testing performed, pt counseled on routine care for respiratory illness.  If testing performed, COVID sent.  Patient directed to Home given findings during today's visit.    Subjective     Elizabeth Browning is a 53 y.o. female who [facility] with complaints of the following:    Exposure History: In the last 21 days?     Have you traveled outside of [place]? Yes               Have you been in close contact with someone confirmed by a test to have COVID? (Close contact is within 6 feet for at least 10 minutes) Yes, Who? Not Answered       Have you worked in a health care facility? No     Lived or worked facility like a nursing home, group home, or assisted living?    No         Are you scheduled to have surgery or a procedure in the next 3 days? Yes               Are you scheduled to receive cancer chemotherapy within the next 7 days?    No     Have you ever been tested before for COVID-19 with a swab of your nose? No   Are you a healthcare worker being tested so to return to work No     Right now,  do you have any of the following that developed over the past 7 days (as stated by patient on intake form):    Subjective fever (felt feverish) No   Chills (especially repeated shaking chills) No   Severe fatigue (felt very tired) No   Muscle aches No   Runny nose No   Sore throat No   Loss of taste or smell No   Cough (new onset or worsening of chronic cough) No   Shortness of breath No   Nausea or vomiting No   Headache No   Abdominal Pain No   Diarrhea (3 or more loose stools in last 24 hours) No       Scribe's Attestation: SHANA PATRICE RATNER, MD obtained and performed the history, physical exam and medical decision making elements that were entered into the chart.  Signed by Renaldo Reel, LPN serving as Scribe, on 03/06/2019 9:12 AM

## 2019-03-08 DIAGNOSIS — L732 Hidradenitis suppurativa: Principal | ICD-10-CM

## 2019-03-08 MED FILL — HUMIRA PEN CITRATE FREE 40 MG/0.4 ML: 28 days supply | Qty: 8 | Fill #11 | Status: AC

## 2019-03-08 MED FILL — HUMIRA PEN CITRATE FREE 40 MG/0.4 ML: 28 days supply | Qty: 8 | Fill #11

## 2019-03-09 ENCOUNTER — Encounter
Admit: 2019-03-09 | Discharge: 2019-03-17 | Disposition: A | Discharge status: Home / Self Care | Payer: MEDICAID | Attending: Student in an Organized Health Care Education/Training Program

## 2019-03-09 ENCOUNTER — Ambulatory Visit: Admit: 2019-03-09 | Discharge: 2019-03-17 | Disposition: A | Discharge status: Home / Self Care | Payer: MEDICAID

## 2019-03-09 DIAGNOSIS — L732 Hidradenitis suppurativa: Principal | ICD-10-CM

## 2019-03-10 NOTE — Unmapped (Signed)
PCA pump continues. Dressing soaked and removed in shower and visualized by team. New dressing placed. Waiting for transfer to floor. Will continue to monitor.   Problem: Wound  Goal: Optimal Wound Healing  Outcome: Progressing     Problem: Latex Allergy  Goal: Absence of Allergy Symptoms  Outcome: Progressing     Problem: Adult Inpatient Plan of Care  Goal: Plan of Care Review  Outcome: Progressing  Goal: Patient-Specific Goal (Individualization)  Outcome: Progressing  Goal: Absence of Hospital-Acquired Illness or Injury  Outcome: Progressing  Goal: Optimal Comfort and Wellbeing  Outcome: Progressing  Goal: Readiness for Transition of Care  Outcome: Progressing  Goal: Rounds/Family Conference  Outcome: Progressing     Problem: Self-Care Deficit  Goal: Improved Ability to Complete Activities of Daily Living  Outcome: Progressing     Problem: Fall Injury Risk  Goal: Absence of Fall and Fall-Related Injury  Outcome: Progressing     Problem: Asthma Comorbidity  Goal: Maintenance of Asthma Control  Outcome: Progressing     Problem: Wound  Goal: Optimal Wound Healing  Outcome: Progressing

## 2019-03-10 NOTE — Unmapped (Signed)
Order was placed for a PIV by Venous Access Team (VAT).  Patient was assessed for placement of a PIV. Access was obtained. Blood return noted.  Dressing intact and device well secured.  Flushed with normal saline.  Pt advised to inform RN of any s/s of discomfort at the PIV site.    Workup / Procedure Time:  15 minutes        RN was notified.       Thank you,     Fredderick Erb RN Venous Access Team

## 2019-03-10 NOTE — Unmapped (Signed)
Care Management  Initial Transition Planning Assessment    CM met with patient in pt room.  Pt/visitors were wearing hospital provided masks for the duration of the interaction with CM.  CM was wearing hospital provided surgical mask and hospital provided eye protection.  CM was within 6 foot of the patient/visitors during this interaction.               General  Care Manager assessed the patient by : In person interview with patient, Medical record review, Discussion with Clinical Care team, Telephone conversation with family  Orientation Level: Oriented X4  Who provides care at home?: N/A  Reason for referral: Discharge Planning, Placement, Transportation    Contact/Decision Maker  Extended Emergency Contact Information  Primary Emergency Contact: [place]  Address: [address]           [place], [place] [address] [place] of [place]  Home Phone: [phone]  Work Phone: [phone]  Mobile Phone: [phone]  Relation: Sister  Preferred language: ENGLISH     Patient:  [phone]    Legal Next of Kin / Guardian / POA / Advance Directives     HCDM (HCPOA): Javier Glazier - Sister - [phone]    Advance Directive (Medical Treatment)  Does patient have an advance directive covering medical treatment?: Patient does not have advance directive covering medical treatment.  Reason patient does not have an advance directive covering medical treatment:: Patient does not wish to complete one at this time.    Health Care Decision Maker [HCDM] (Medical & Mental Health Treatment)  Healthcare Decision Maker: Patient does not wish to appoint a Health Care Decision Maker at this time  Information offered on HCDM, Medical & Mental Health advance directives:: Patient declined information.    Advance Directive (Mental Health Treatment)  Does patient have an advance directive covering mental health treatment?: Patient does not have advance directive covering mental health treatment.  Reason patient does not have an advance directive covering mental health treatment:: HCDM documented in the HCDM/Contact Info section., Patient does not wish to complete one at this time.    Patient Information  Medical Provider(s): Lilian Kapur, [place]  Reason for Admission: Admitting Diagnosis:  Hidradenitis suppurativa [L73.2]  Past Medical History:   has a past medical history of Anesthesia to pain, Anxiety, Arthritis, Asthma, Cancer (CMS-HCC), Chronic bronchitis (CMS-HCC), Chronic hepatitis C without hepatic coma (CMS-HCC), Colon polyp, Difficult intravenous access, DOE (dyspnea on exertion), Dysthymia (RAF-HCC), Fatigue, Foot pain, GERD (gastroesophageal reflux disease) (RAF-HCC), Hepatitis C, Loosening of knee joint prosthesis (CMS-HCC), Migraine, MRSA (methicillin resistant staph aureus) culture positive, Neuropathic pain, URI (upper respiratory infection), and Vitamin D deficiency.  Past Surgical History:   has a past surgical history that includes Knee surgery (Left); Toe amputation (Right); Hand surgery (Bilateral); Mouth surgery; Bunionectomy (Bilateral); TONSSILLECTOMY; Tubal ligation; CARPOMETACARPEL SUSPENSION PLASTY (Bilateral); Hysterectomy; Joint replacement (Left); Umbilical hernia repair; pr revise knee joint replace,all parts (Left, 11/04/2016); pr incis/drain thigh/knee abscess,deep (Left, 11/11/2016); and pr exc sweat gland lesn perineal,simpl (Right, 03/09/2019).   Previous admit date: 11/09/2016    Lives with: Alone    Type of Residence: Private residence     Location/Detail: Patient lives in a ground floor apartment with no steps/or ramp to entrance.  She lives alone.  Address is:  [address]  [address]  [place], [place] [address]  ([place]    Support Systems: Family Members    Responsibilities/Dependents at home?: No    Home Care services in place  prior to admission?: Yes  Type of Home Care services in place prior to admission: Housekeeping  Current Home Care provider (Name/Phone #): [facility]  ph:  [phone] suppies CNA for housekeeping/light duty home task e 7 day a week for up to 13 hours (this paid for through [facility])    Outpatient/Community Resources in place prior to admission: Clinic  Agency detail (Name/Phone #): PCP:  Jimmye Norman, MD    Equipment Currently Used at Home: other (see comments)(rollator)  Current HME Agency (Name/Phone #): unknown    Currently receiving outpatient dialysis?: No    Financial Information  Need for financial assistance?: Yes - states she gets food stamps through [facility] but sometimes runs out of food at home.     Primary Insurance- Payor: MEDICAID [place] / Plan: MEDICAID [place] ACCESS / Product Type: *No Product type* /   Secondary Insurance ??? None  Prescription Coverage ??? Medicaid  Preferred Pharmacy - [id number] - [place], [place] - [address] AT [facility] OF [address]  [facility]  [facility] [phone] - [place], [place] - [address] AT [facility] OF [address]    Transportation home: Medical Transport (takes medical transport to MD appointments)  Level of function prior to admission: Requires Assistance    Social Determinants of Health  Social Determinants of Health were addressed in provider documentation.  Please refer to patient history.    Discharge Needs Assessment  Concerns to be Addressed: care coordination/care conferences, discharge planning, adjustment to diagnosis/illness    Clinical Risk Factors: Multiple Diagnoses (Chronic), Functional Limitations, Lives Alone or Absence of Caregiver to Assist with Discharge and Home Care    Barriers to taking medications: No    Prior overnight hospital stay or ED visit in last 90 days: Yes    Readmission Within the Last 30 Days: no previous admission in last 30 days    Anticipated Changes Related to Illness: inability to care for self    Equipment Needed After Discharge: wound care supplies    Discharge Facility/Level of Care Needs: nursing facility, skilled    Readmission  Risk of Unplanned Readmission Score: UNPLANNED READMISSION SCORE: 9%  Predictive Model Details           9% (Low) Factors Contributing to Score   Calculated 03/10/2019 13:46 50% Number of active Rx orders is 38   [place] Risk of Unplanned Readmission Model 18% Active antipsychotic Rx order is present     9% [place] Comorbidity Index is 4     8% Active corticosteroid Rx order is present     8% Age is 53     4% Future appointment is scheduled     2% Active ulcer medication Rx order is present     2% Current length of stay is 0.937 days     Readmitted Within the Last 30 Days? (No if blank)   Patient at risk for readmission?: Yes    Discharge Plan  Screen findings are: Discharge planning needs identified or anticipated (Comment).    Expected Discharge Date: 03/10/2019    Patient and/or family were provided with choice of facilities / services that are available and appropriate to meet post hospital care needs?: Yes   List choices in order highest to lowest preferred, if applicable. : patient requesting SNF at discharge for wound care, short-term PT/OT.  CM provided her with list of SNF facilities  in [place] and surrounding counties - patient reviewing list.    Initial Assessment complete?: Yes     CM will continue to follow for care progression and avoidable delays.     Norva Pavlov, RN, BSN  Care Manager [phone])  March 10, 2019 2:00 PM

## 2019-03-10 NOTE — Unmapped (Signed)
Patient arrived to [facility] from PACU after Right Buttocks skin excision. Transported via stretcher. Patient was very drowsy from post-surgery anaesthesia but she was able to scoot self from stretcher to bed. VSS stable on room air. She was very drowsy this shift. Tolerating regular diet. PCA hydromorphone started this shift. Patient verbalized understanding of patient controlled and demonstrates appropriate use. She is able to ambulate with her 4 wheel walker to the bathroom with standby assistance. Compliant with all medication. Compliant with SCDs. Patient needed new PIV to run antibiotic. VAT team placed a #22 in LFA. Remains free from falls. Bed alarm activated and working. Patient has excellent PO intake. Resting quietly at this time. Will continue with plan of care.     Problem: Wound  Goal: Optimal Wound Healing  Outcome: Progressing     Problem: Latex Allergy  Goal: Absence of Allergy Symptoms  Outcome: Progressing     Problem: Adult Inpatient Plan of Care  Goal: Plan of Care Review  Outcome: Progressing  Goal: Patient-Specific Goal (Individualization)  Outcome: Progressing  Goal: Absence of Hospital-Acquired Illness or Injury  Outcome: Progressing  Goal: Optimal Comfort and Wellbeing  Outcome: Progressing  Goal: Readiness for Transition of Care  Outcome: Progressing  Goal: Rounds/Family Conference  Outcome: Progressing     Problem: Self-Care Deficit  Goal: Improved Ability to Complete Activities of Daily Living  Outcome: Progressing     Problem: Fall Injury Risk  Goal: Absence of Fall and Fall-Related Injury  Outcome: Progressing     Problem: Asthma Comorbidity  Goal: Maintenance of Asthma Control  Outcome: Progressing

## 2019-03-10 NOTE — Unmapped (Signed)
Brief Operative Note  (CSN: [id number])      Date of Surgery: 03/09/2019    Pre-op Diagnosis: hidradenitis    Post-op Diagnosis: Hidradenitis suppurativa [L73.2]    Procedure(s):  R21 - EXCISION SKIN/SUBCUTANEOUS TISSUE FOR HIDRADENITIS, PERIANAL, PERINEAL, UMBILICAL; SIMPLE/INTERMEDIATE REPR: 11470 (CPT??)  Note: Revisions to procedures should be made in chart - see Procedures activity.    Performing Service: Librarian, academic) and Role:     * Arsenio Katz, MD - Primary     * Floyde Parkins, MD - Resident - Assisting    Assistant: None    Findings: Right buttock lesion excised to subcutaneous fat leaving with healthy, punctate bleeding; remaining wound 20 x 22 cm    Anesthesia: General    Estimated Blood Loss: 10 mL    Complications: None    Specimens:   ID Type Source Tests Collected by Time Destination   1 : Right Buttocks Hidradenitis Tissue Buttock, right SURGICAL PATHOLOGY EXAM Arsenio Katz, MD 03/09/2019 1634        Implants: * No implants in log *    Surgeon Notes: Dr. Lafayette Dragon was present and scrubbed for the entire procedure

## 2019-03-10 NOTE — Unmapped (Signed)
Date: 03/09/2019    Preoperative Diagnosis: Buttock hidradenitis  Postoperative Diagnosis: Same    Procedure performed: excision of right gluteal hidradenitis measuring 22 x 25 cm    Staff: Lamarr Lulas, MD  Resident: Floyde Parkins, MD    Blood Loss: 10 mL    Anesthesia: General with local 0.25% marcaine with epinephrine    Specimen: right gluteal hidradenitis    Indications: Elizabeth Browning is a 53 y/o F with a history of hidradenitis, who presented to clinic to discuss options for surgical intervention.  We discussed a staged approach, and we she presents today for excision of the right gluteal hidradenitis as her preference.    Description of the Procedure: Elizabeth Mohammad was identified in the preoperative holding area, where her history and physical exam were updated.  She was marked, with the areas of concern confirmed with the patient, and was then transported back to the operating room where she was positioned supine on her stretcher.  A time out was called and the appropriate patient, procedure, and laterality were confirmed.  General endotracheal anesthesia was then induced via the anesthesia team.  Elizabeth Stirling was then turned on to the OR bed in the prone position.  Preoperative antibiotics were assured to be administered.  All pressure points were assured to be padded.  The buttock was prepped and draped in the usual sterile fashion.    The area of drainage and scars from previous drainage had been marked with the patient in preop.  The periphery of the planned incision was infiltrated with local anesthesia.  A 15 blade scalpel was used to incise the skin and the cautery was then used to complete the dissection.  The tissues were palpated during the excision, with normal soft and healthy appearing tissue as the conclusion of the dissection.  The tissue was measured and passed off the table as permanent specimen.  The excision area was then irrigated with normal saline.    ??  The excision areas were assessed again, with no obvious remaining diseased tissue.  The area was large and could not be closed primarily.  Additional local was infiltrated in to the base of the wound and the periphery.  Tisseal was sprayed at the base of the wound.  A dressing of wet to dry kerlix, followed by ABD pads and mesh underwear was then applied.    ??  She was awakened from anesthesia and transported to the PACU in stable condition.  All instrument counts were correct at the conclusion of the case.

## 2019-03-10 NOTE — Unmapped (Signed)
Plastic Surgery Progress Note      Service/Pager: SRC-1, [phone]  Attending Physician:  Arsenio Katz, *  Date: 03/10/2019      Assessment:  53 y.o. female with [name] stage III hidradenitis suppurativa, who is now s/p excision of right buttock lesion on 8/25.       Plan:  - Continue local wound care as follows:  - Wet to dry BID with kerlix, ABD, mesh panties  - Can change PRN for soilage  - Continue antibiotics (Ancef/Keflex) for 3 day course total  - Multimodal pain control- sch tylenol, PRN oxycodone, dPCA  - Continue home albuterol, flexeril, lyrica, spirinolactone  - Regular diet, medlocked  - Appreciate CM with SNF placement for wound care post discharge      Subjective  NO acute events overnight. Patient shares that she has no help at home for wound care - will explore options for SNF placement.      Physical Exam  Vitals:    03/10/19 0738   BP: 90/56   Pulse: 58   Resp: 18   Temp: 36.3 ??C   SpO2: 100%       General: resting comfortably in bed  Neuro: alert and oriented. Moves all extremities x 4  Pulmonary: Normal respiratory effort.   Cardiovascular: Regular rate.   Extremities:  Warm, well-perfused  Right buttock wound measuring 22 x 25 cm with viable fat in the wound base. Largely hemostatic, viable skin edges    Labs & Studies:  Lab Results   Component Value Date    WBC 7.1 12/25/2016    HGB 12.8 12/25/2016    HCT 38.3 12/25/2016    PLT 502 (H) 12/25/2016       Lab Results   Component Value Date    NA 133 (L) 11/12/2016    K 4.7 11/12/2016    CL 100 11/12/2016    CO2 24.0 11/12/2016    BUN 7 11/12/2016    CREATININE 0.87 11/12/2016    GLU 248 (H) 11/12/2016    CALCIUM 9.1 11/12/2016       Lab Results   Component Value Date    BILITOT 0.5 11/09/2016    PROT 8.5 (H) 11/09/2016    ALBUMIN 3.6 11/09/2016    ALT 17 08/18/2017    AST 29 08/18/2017    ALKPHOS 84 11/09/2016       Lab Results   Component Value Date    PT 13.4 10/25/2016    INR 1.02 10/25/2016                Microbiology Results (last day)     ** No results found for the last 24 hours. **

## 2019-03-10 NOTE — Unmapped (Signed)
""  Order was placed for a \""PIV by Venous Access Team (VAT)\"".  Patient was assessed for placement of a PIV. Access was obtained. Blood return noted.  Dressing intact and device well secured.  Flushed with normal saline.  Pt advised to inform RN of any s/s of discomfort at the PIV site.    Workup / Procedure Time:  15 minutes       RN was notified.       Thank you,     Marylee Floras RN Venous Access Team""

## 2019-03-11 NOTE — Unmapped (Signed)
Plastic Surgery Progress Note      Service/Pager: SRC-1, [phone]  Attending Physician:  Arsenio Katz, *  Date: 03/11/2019      Assessment:  53 y.o. female with [name] stage III hidradenitis suppurativa, who is now s/p excision of right buttock lesion on 8/25.       Plan:  - Continue local wound care as follows:  - Wet to dry BID with kerlix, ABD, mesh panties  - Can change PRN for soilage  - Continue antibiotics (Ancef/Keflex) for 3 day course total  - Multimodal pain control- sch tylenol, PRN oxycodone, PRN ibuprofen, dPCA increase  - Continue home albuterol, flexeril, lyrica, spirinolactone  - Regular diet, medlocked  - Appreciate CM with SNF placement for wound care post discharge      Subjective  No acute events. Poor pain control (reportedly 8-9/10) despite current multimodals. Patient requesting room with bathroom due to having to ambulate far distances with freshly open wound. Otherwise, patient states she is doing well      Physical Exam  Vitals:    03/11/19 0715   BP: 128/69   Pulse: 68   Resp: 18   Temp: 37 ??C   SpO2: 92%       General: resting comfortably in bed  Neuro: alert and oriented. Moves all extremities x 4  Pulmonary: Normal respiratory effort.   Cardiovascular: Regular rate.   Extremities:  Warm, well-perfused  Right buttock largely hemostatic, viable skin edges

## 2019-03-11 NOTE — Unmapped (Signed)
Adult Nutrition Assessment Note    This patient was not seen in person. The clinical nutrition service has moved to a liaison model to minimize potential spread of COVID-19, protect patients/providers and reduce PPE utilization.  During this time, we will be limiting person-to-person contact when possible.    Visit Type: RN Consult  Reason for Visit: Per Admission Nutrition Screen (Adult), Have you gained or lost 10 pounds in the past 3 months?    ASSESSMENT:  HPI: 53 y.o. female with [place] stage III hidradenitis suppurativa, who is now s/p excision of right buttock lesion on 8/25.     Nutrition Hx: RD working remotely for infection prevention, spoke with patient via hospital phone.  Patient reported UBW of 245-250 lbs with recent gain up to 270 lbs after taking new medication for depression. Patient denied changes in appetite/intake. Patient does not take vitamins/supplements at home.     Patient Lines/Drains/Airways Status    Active Wounds     Name:   Placement date:   Placement time:   Site:   Days:    Surgical Site 11/09/16 Knee Left   11/09/16    2311     851    Surgical Site 11/11/16 Knee Left   11/11/16    1533     849    Surgical Site 03/09/19 Buttocks Right   03/09/19    1700     1                 Meds, labs, hospital course reviewed.    Current nutrition therapy order:   Nutrition Orders          Supplement Adult; Ensure Max Protein (High Protein/Low Cal/Low Carb); # of Products PER Serving: 1 At [employer] starting at 08/27 1200    Nutrition Therapy General (Regular) starting at 08/25 1950           Anthropometric Data:  -- Height: 177.8 cm (5' 10)   -- Last recorded weight: (!) 121.2 kg (267 lb 3.2 oz)  -- Admission weight: (!) 121.2 kg (267 lb 3.2 oz)  -- IBW: 68.06 kg  -- Percent IBW: 178.08 %  -- BMI: Body mass index is 38.34 kg/m??.   -- Weight changes this admission:   Last 5 Recorded Weights    03/09/19 1954   Weight: (!) 121.2 kg (267 lb 3.2 oz)      -- Weight history PTA:   Wt Readings from Last 10 Encounters:   03/09/19 (!) 121.2 kg (267 lb 3.2 oz)   02/08/19 (!) 122.9 kg (271 lb)   02/03/17 (!) 116.6 kg (257 lb)   12/25/16 (!) 117 kg (258 lb)   11/26/16 (!) 117 kg (258 lb)   11/09/16 (!) 117 kg (258 lb)   10/23/16 (!) 118.4 kg (261 lb 1.6 oz)   10/10/16 (!) 113.4 kg (250 lb)   09/30/16 (!) 117.9 kg (260 lb)   09/05/16 (!) 120.2 kg (265 lb)        Nutrition Focused Physical Exam:   Nutrition Evaluation  Overall Impressions: Nutrition-Focused Physical Exam not indicated due to lack of malnutrition risk factors. (03/11/19 1610)  Nutrition Designation: Obese class II  (BMI 35.00 - 39.99 kg/m2) (03/11/19 0843)     DIAGNOSIS  Malnutrition Assessment using AND/ASPEN Clinical Characteristics:    Patient does not meet AND/ASPEN criteria for malnutrition at this time (03/11/19 0843)  Patient reported good appetite since admit. Current diet order appropriate. Discussed high-protein supplement to assist with meeting nutrition needs for healing and patient agreeable. Anticipate patient will meet nutrition needs through PO intake. No additional nutrition problems noted that need to be addressed at this time.    INTERVENTION:  Recommend continue current nutrition therapy   Recommend Ensure Max Protein daily   No nutrition intervention needed at this time.   Please reconsult if condition changes.     Electronically signed by:   Cross-Coverage Dietitian   Tonye Becket, RD, LDN, [facility]  Pager: [phone]

## 2019-03-11 NOTE — Unmapped (Signed)
VSS, patient is A&Ox4, right buttocks dressing changed this shift, wet to dry dressing done as ordered, patient tolerated it well. Patient has been up ambulating around the unit this shift, PRN oxy, motrin and benadryl given this shift for c/o pain and itching. Call light remains within patients reach, will continue to monitor.   Problem: Wound  Goal: Optimal Wound Healing  Outcome: Progressing     Problem: Wound  Goal: Optimal Wound Healing  Outcome: Progressing     Problem: Latex Allergy  Goal: Absence of Allergy Symptoms  Outcome: Progressing     Problem: Adult Inpatient Plan of Care  Goal: Plan of Care Review  Outcome: Progressing  Goal: Patient-Specific Goal (Individualization)  Outcome: Progressing  Goal: Absence of Hospital-Acquired Illness or Injury  Outcome: Progressing  Goal: Optimal Comfort and Wellbeing  Outcome: Progressing  Goal: Readiness for Transition of Care  Outcome: Progressing  Goal: Rounds/Family Conference  Outcome: Progressing     Problem: Self-Care Deficit  Goal: Improved Ability to Complete Activities of Daily Living  Outcome: Progressing     Problem: Fall Injury Risk  Goal: Absence of Fall and Fall-Related Injury  Outcome: Progressing     Problem: Asthma Comorbidity  Goal: Maintenance of Asthma Control  Outcome: Progressing

## 2019-03-11 NOTE — Unmapped (Signed)
Pt alert and oriented x 4. Pt complained of pain, administered PRN pain medication as directed. Pt complained of itching, administered PRN medication as directed. Dressing change done during this shift, pt tolerated well. Pt has been ambulating from room to restroom with assistance and walker. Call bell within reach and bed in lowest position. Will continue to monitor.    Problem: Wound  Goal: Optimal Wound Healing  Outcome: Progressing     Problem: Latex Allergy  Goal: Absence of Allergy Symptoms  Outcome: Progressing     Problem: Adult Inpatient Plan of Care  Goal: Plan of Care Review  Outcome: Progressing  Goal: Patient-Specific Goal (Individualization)  Outcome: Progressing  Goal: Absence of Hospital-Acquired Illness or Injury  Outcome: Progressing  Goal: Optimal Comfort and Wellbeing  Outcome: Progressing  Goal: Readiness for Transition of Care  Outcome: Progressing  Goal: Rounds/Family Conference  Outcome: Progressing     Problem: Self-Care Deficit  Goal: Improved Ability to Complete Activities of Daily Living  Outcome: Progressing     Problem: Fall Injury Risk  Goal: Absence of Fall and Fall-Related Injury  Outcome: Progressing

## 2019-03-12 NOTE — Unmapped (Signed)
PHYSICAL THERAPY  Evaluation (03/12/19 1005)     Patient Name:  Elizabeth Browning       Medical Record Number: [id number]   Date of Birth: 28-Apr-1966  Sex: Female            Treatment Diagnosis: Impaired funcitonal Independence    ASSESSMENT  Problem List: Decreased mobility, Impaired balance, Decreased endurance, Pain     Assessment : 53 y.o. female with [name] stage III hidradenitis suppurativa, who is now s/p excision of right buttock lesion on 8/25.  Pt presents w/ decreased funcitonal mobility from baseline; able to amb up to 400' w/ rollator w/ one sitting rest, transfers to rollator mod I.  Pt will benefit from skilled inpt PT to address pts deficits and goals.     Today's Interventions: PT Eval,  pt performed bed mobility, transfers, balance work and gait training w/ rollator.  pt education for safety, precautions and progressive mobility.    Personal Factors/Comorbidities Present: 2       Examination of Body System: 1-3 elements       Clinical Decision Making: Low     PLAN  Planned Frequency of Treatment:  1-2x per day for: 2-3x week      Planned Interventions: Airway Clearance, Balance activities, Education - Patient, Home exercise program, Gait training, Functional mobility, Postural re-education, Endurance activities, Self-care / Home training, Therapeutic exercise, Therapeutic activity, Transfer training    Post-Discharge Physical Therapy Recommendations:  5x weekly    PT DME Recommendations: None           Goals:   Patient and Family Goals: To have her wound heal.    Long Term Goal #1: Pt will amb, mod I, w/ rollator comm. distances 6 weeks from today.       SHORT GOAL #1: Pt will amb w/ rollator mod I 500'.              Time Frame : 2 weeks  SHORT GOAL #2: Pt will demonstrate improved balance ability by being able to perform single leg stance on L and R for > 10 seconds and thereby lesson her fall risk.              Time Frame : 2 weeks Prognosis:  Good  Positive Indicators: PLOF, motivation  Barriers to Discharge: Decreased caregiver support, Inability to safely perform ADLS, Endurance deficits, Gait instability, Impaired Balance    SUBJECTIVE  Patient reports: Pt agreeable to PT, ready to amb w/ her rollator, wants to take a showe and discussing w/ RN.  Current Functional Status: Pt standing in room when PT arrived and left resting at EOB, all needs met.     Prior Functional Status: reports amb w/ rollator mod I PLOF  Equipment available at home: Rollator     Past Medical History:   Diagnosis Date   ??? Anesthesia to pain     difficulty relaxing and letting anesthesia work- anxiety noted   ??? Anxiety    ??? Arthritis    ??? Asthma    ??? Cancer (CMS-HCC)     cervical   ??? Chronic bronchitis (CMS-HCC)    ??? Chronic hepatitis C without hepatic coma (CMS-HCC)     cleared as of 10/25/16 per patient from [facility]   ??? Colon polyp    ??? Difficult intravenous access     requesting site to be numbed   ??? DOE (dyspnea on exertion)    ??? Dysthymia (RAF-HCC)    ??? Fatigue    ???  Foot pain    ??? GERD (gastroesophageal reflux disease) (RAF-HCC)    ??? Hepatitis C    ??? Loosening of knee joint prosthesis (CMS-HCC)     left   ??? Migraine    ??? MRSA (methicillin resistant staph aureus) culture positive     no + culture found- pt denies-current culture negative   ??? Neuropathic pain    ??? URI (upper respiratory infection)    ??? Vitamin D deficiency     Social History     Tobacco Use   ??? Smoking status: Former Smoker     Packs/day: 0.50     Years: 30.00     Pack years: 15.00     Types: Cigarettes   ??? Smokeless tobacco: Never Used   ??? Tobacco comment: started smoking age as a teen   Substance Use Topics   ??? Alcohol use: No      Past Surgical History:   Procedure Laterality Date   ??? BUNIONECTOMY Bilateral    ??? CARPOMETACARPEL SUSPENSION PLASTY Bilateral     tendon repair   ??? HAND SURGERY Bilateral     trigger finger   ??? HYSTERECTOMY      partial   ??? JOINT REPLACEMENT Left     knee   ??? KNEE SURGERY Left     revision 8 months later post TKR   ??? MOUTH SURGERY      all teeth removed   ??? PR EXC SWEAT GLAND LESN PERINEAL,SIMPL Right 03/09/2019    Procedure: R21 - EXCISION SKIN/SUBCUTANEOUS TISSUE FOR HIDRADENITIS, PERIANAL, PERINEAL, UMBILICAL; SIMPLE/INTERMEDIATE REPR;  Surgeon: Arsenio Katz, MD;  Location: MAIN OR [facility];  Service: Plastics   ??? PR INCIS/DRAIN THIGH/KNEE ABSCESS,DEEP Left 11/11/2016    Procedure: I&D DEEP ABSCESS INFEC BURSA/HEMATOMA THIGH/KNEE;  Surgeon: Lowell Bouton, MD;  Location: OR [facility];  Service: Orthopedics   ??? PR REVISE KNEE JOINT REPLACE,ALL PARTS Left 11/04/2016    Procedure: REVISION LEFT TOTAL KNEE REPLACEMENT, FEMORAL AND TIBIAL COMPARTMENT;  Surgeon: Lowell Bouton, MD;  Location: OR [facility];  Service: Orthopedics   ??? TOE AMPUTATION Right     pinky    ??? TONSSILLECTOMY     ??? TUBAL LIGATION     ??? UMBILICAL HERNIA REPAIR      age 39    Family History   Problem Relation Age of Onset   ??? Hypertension Mother    ??? Diabetes Mother    ??? Hypertension Father    ??? Cancer Father         Leukemia   ??? Cancer Sister         breast        Allergies: Codeine, Buprenorphine hcl, Adhesive, Iodinated contrast media, Latex, Morphine, and Opioids - morphine analogues                Objective Findings  Precautions / Restrictions  Precautions: Falls precautions  Weight Bearing Status: Non-applicable  Required Braces or Orthoses: Non-applicable    Communication Preference: Verbal   Pain Comments: min report of pain  Medical Tests / Procedures: Reviewed H&P, orders.  Equipment / Environment: Vascular access (PIV, TLC, Port-a-cath, PICC), PCA, Patient wearing mask for full session, Caregiver wearing mask for full session    At Rest: VSS per EPIC  With Activity: NAD  Orthostatics: NAD  Airway Clearance: No s/sx    Living Situation  Living Environment: Apartment  Lives With: Alone  Home Living: One level home, Level entry, Tub/shower unit, Standard height  toilet, Grab bars in shower Cognition: A&O x 4  Visual / Perception Status: Intact  Skin Inspection: CDI visible areas    UE ROM: WFL  UE Strength: WFL  LE ROM: WFL  LE Strength: WFL                Coordination: Good         Balance: Sitting Independent, standing w/ rollator Independent   Posture: forward head, rounded shlds.     Bed Mobility: NT, up when PT arrived  Transfers: Sit <> stand mod I to rollator   Gait  Level of Assistance: Standby assist, set-up cues, supervision of patient - no hands on  Assistive Device: Four wheel walker  Distance Ambulated (ft): 400 ft  Gait: Pt amb w/ rollator 400' w/ SBA, one sitting rest  Stairs: N/A      Endurance: Good    Physical Therapy Session Duration  PT Individual - Duration: 30    Medical Staff Made Aware: RN    I attest that I have reviewed the above information.  Signed: Shannan Harper, PT  Filed 03/12/2019

## 2019-03-12 NOTE — Unmapped (Signed)
Patient remains free from injury during shift. VSS on RA. Patient up ad lib and voiding adequately. Dressing to r buttocks changed during shift - currently c/d/I. Patient has dPCA in place. Patient reported low pain scores and did not request additional prn pain medication. Patient tolerating regular diet with no reports of nausea. Will continue to monitor.     Problem: Wound  Goal: Optimal Wound Healing  Outcome: Progressing     Problem: Latex Allergy  Goal: Absence of Allergy Symptoms  Outcome: Progressing     Problem: Adult Inpatient Plan of Care  Goal: Plan of Care Review  Outcome: Progressing  Goal: Patient-Specific Goal (Individualization)  Outcome: Progressing  Goal: Absence of Hospital-Acquired Illness or Injury  Outcome: Progressing  Goal: Optimal Comfort and Wellbeing  Outcome: Progressing  Goal: Readiness for Transition of Care  Outcome: Progressing  Goal: Rounds/Family Conference  Outcome: Progressing     Problem: Self-Care Deficit  Goal: Improved Ability to Complete Activities of Daily Living  Outcome: Progressing     Problem: Fall Injury Risk  Goal: Absence of Fall and Fall-Related Injury  Outcome: Progressing     Problem: Asthma Comorbidity  Goal: Maintenance of Asthma Control  Outcome: Progressing     Problem: Wound  Goal: Optimal Wound Healing  Outcome: Progressing

## 2019-03-12 NOTE — Unmapped (Signed)
Pt A&Ox4, VSS on RA. No acute events this shift. PRN oxy given x2 this shift. DPCA infusing as ordered, currently waiting for VAT to place a new PIV. Dressing changed per order, tolerated well. Pt up ambulating with minimal assistance. Adequate intake and UOP. Pt did have a bowel movement and noticed blood in her stools. This RN observed, but it did not look like frank blood. SRC-1 paged, no response. Pt resting in bed, call bell within reach, bed in lowest position. Free of falls. Will continue to monitor.    Problem: Wound  Goal: Optimal Wound Healing  Outcome: Progressing     Problem: Wound  Goal: Optimal Wound Healing  Outcome: Progressing     Problem: Latex Allergy  Goal: Absence of Allergy Symptoms  Outcome: Progressing     Problem: Adult Inpatient Plan of Care  Goal: Plan of Care Review  Outcome: Progressing  Goal: Patient-Specific Goal (Individualization)  Outcome: Progressing  Goal: Absence of Hospital-Acquired Illness or Injury  Outcome: Progressing  Goal: Optimal Comfort and Wellbeing  Outcome: Progressing  Goal: Readiness for Transition of Care  Outcome: Progressing  Goal: Rounds/Family Conference  Outcome: Progressing     Problem: Self-Care Deficit  Goal: Improved Ability to Complete Activities of Daily Living  Outcome: Progressing     Problem: Fall Injury Risk  Goal: Absence of Fall and Fall-Related Injury  Outcome: Progressing     Problem: Asthma Comorbidity  Goal: Maintenance of Asthma Control  Outcome: Progressing

## 2019-03-12 NOTE — Unmapped (Signed)
Plastic Surgery Progress Note      Service/Pager: SRC-1, [phone]  Attending Physician:  Arsenio Katz, *  Date: 03/12/2019      Assessment:  53 y.o. female with [name] stage III hidradenitis suppurativa, who is now 3 Days Post-Op s/p excision of right buttock lesion on 8/25.       Plan:  - Continue local wound care as follows:  - Wet to dry BID with kerlix, ABD, mesh panties - dressing to be done by RN for appropriate premedication timing  - Can change PRN for soilage  - Continue antibiotics (Ancef/Keflex) for 3 day course total  - Multimodal pain control- sch tylenol, PRN oxycodone, PRN ibuprofen, dPCA - no changes to pain medication regimen today  - Continue home albuterol, flexeril, lyrica, spirinolactone  - Regular diet, medlocked  - Appreciate CM with SNF placement for wound care post discharge. PT/OT consults.      Subjective  NAEO. This morning she states her right buttocks hurts significantly. She didn't use her PCA while sleeping, and per nursing her last dose of oxycodone was yesterday evening, so she has fallen behind on her pain regimen. She is tolerating her dressing changes well.      Physical Exam  Vitals:    03/12/19 0916   BP: 125/60   Pulse: 77   Resp: 18   Temp: 36.7 ??C   SpO2: 93%       General: resting comfortably in bed  Neuro: alert and oriented. Moves all extremities x 4  Pulmonary: Normal respiratory effort.   Extremities:  Warm, well-perfused  Right buttock largely hemostatic, viable skin edges

## 2019-03-12 NOTE — Unmapped (Signed)
OCCUPATIONAL THERAPY  Evaluation (03/12/19 0908)    Patient Name:  Elizabeth Browning       Medical Record Number: [id number]   Date of Birth: 06/26/1966  Sex: Female          OT Treatment Diagnosis:  difficulty with ADLs s/p surgery    Assessment: 53 y.o. female with [name] stage III hidradenitis suppurativa, who is now s/p excision of right buttock lesion on 8/25.  Pt completes ADLs & functional mobility mod I-independent.  No further acute or post acute needs.  Review of pt's occupational profile, client history, assessment of occupational performance, clinical decision making and development of POC required low complexity OT evaluation.     Today's Interventions: role of OT, POC, education on benefits of continuing to complete ADLs while in hospital to maintain strength, endurance and independence    Activity Tolerance During Today's Session  Patient tolerated treatment well    Plan  Planned Frequency of Treatment:  D/C Services for: D/C Services    Post-Discharge Occupational Therapy Recommendations:  OT Post Acute Discharge Recommendations: OT services not indicated   OT DME Recommendations: None    GOALS:   Patient and Family Goals: to get out of the hospital  Barriers to Discharge: None    Subjective  Current Status Pt received sitting EOB, left in bed, all needs within reach, RN Fredric Mare notified  Prior Functional Status mod I-independent for ADLs and mobility using rollator    Medical Tests / Procedures: reviewed  Patient / Caregiver reports: I'm sorry if I fall asleep on you.    Past Medical History:   Diagnosis Date   ??? Anesthesia to pain     difficulty relaxing and letting anesthesia work- anxiety noted   ??? Anxiety    ??? Arthritis    ??? Asthma    ??? Cancer (CMS-HCC)     cervical   ??? Chronic bronchitis (CMS-HCC)    ??? Chronic hepatitis C without hepatic coma (CMS-HCC)     cleared as of 10/25/16 per patient from [facility]   ??? Colon polyp    ??? Difficult intravenous access     requesting site to be numbed   ??? DOE (dyspnea on exertion)    ??? Dysthymia (RAF-HCC)    ??? Fatigue    ??? Foot pain    ??? GERD (gastroesophageal reflux disease) (RAF-HCC)    ??? Hepatitis C    ??? Loosening of knee joint prosthesis (CMS-HCC)     left   ??? Migraine    ??? MRSA (methicillin resistant staph aureus) culture positive     no + culture found- pt denies-current culture negative   ??? Neuropathic pain    ??? URI (upper respiratory infection)    ??? Vitamin D deficiency     Social History     Tobacco Use   ??? Smoking status: Former Smoker     Packs/day: 0.50     Years: 30.00     Pack years: 15.00     Types: Cigarettes   ??? Smokeless tobacco: Never Used   ??? Tobacco comment: started smoking age as a teen   Substance Use Topics   ??? Alcohol use: No      Past Surgical History:   Procedure Laterality Date   ??? BUNIONECTOMY Bilateral    ??? CARPOMETACARPEL SUSPENSION PLASTY Bilateral     tendon repair   ??? HAND SURGERY Bilateral     trigger finger   ??? HYSTERECTOMY  partial   ??? JOINT REPLACEMENT Left     knee   ??? KNEE SURGERY Left     revision 8 months later post TKR   ??? MOUTH SURGERY      all teeth removed   ??? PR EXC SWEAT GLAND LESN PERINEAL,SIMPL Right 03/09/2019    Procedure: R21 - EXCISION SKIN/SUBCUTANEOUS TISSUE FOR HIDRADENITIS, PERIANAL, PERINEAL, UMBILICAL; SIMPLE/INTERMEDIATE REPR;  Surgeon: Arsenio Katz, MD;  Location: MAIN OR [facility];  Service: Plastics   ??? PR INCIS/DRAIN THIGH/KNEE ABSCESS,DEEP Left 11/11/2016    Procedure: I&D DEEP ABSCESS INFEC BURSA/HEMATOMA THIGH/KNEE;  Surgeon: Lowell Bouton, MD;  Location: OR [facility];  Service: Orthopedics   ??? PR REVISE KNEE JOINT REPLACE,ALL PARTS Left 11/04/2016    Procedure: REVISION LEFT TOTAL KNEE REPLACEMENT, FEMORAL AND TIBIAL COMPARTMENT;  Surgeon: Lowell Bouton, MD;  Location: OR [facility];  Service: Orthopedics   ??? TOE AMPUTATION Right     pinky    ??? TONSSILLECTOMY     ??? TUBAL LIGATION     ??? UMBILICAL HERNIA REPAIR      age 64    Family History   Problem Relation Age of Onset   ??? Hypertension Mother    ??? Diabetes Mother    ??? Hypertension Father    ??? Cancer Father         Leukemia   ??? Cancer Sister         breast        Codeine, Buprenorphine hcl, Adhesive, Iodinated contrast media, Latex, Morphine, and Opioids - morphine analogues     Objective Findings  Precautions / Restrictions  Falls precautions    Weight Bearing  Non-applicable    Required Braces or Orthoses  Non-applicable    Communication Preference  Verbal    Pain  c/o post op discomfort, did not quantify    Equipment / Environment  Vascular access (PIV, TLC, Port-a-cath, PICC), PCA    Living Situation  Living Environment: Apartment(1st floor)  Lives With: Alone  Home Living: One level home, Level entry, Tub/shower unit, Standard height toilet, Grab bars in shower     Cognition   Orientation Level:  Oriented x 4   Arousal/Alertness:  Appropriate responses to stimuli   Attention Span:  Appears intact   Memory:  Appears intact   Following Commands:  Follows all commands and directions without difficulty   Safety Judgment:  Good awareness of safety precautions   Awareness of Errors:  Good awareness of safety precautions   Problem Solving:  Able to problem solve independently    Vision / Perception    Hearing: WFL   Vision: Wears glasses all the time  Perception: WFL    Hand Function  Hand Dominance: right  WFL    Skin Inspection  visible skin c/d/i    ROM / Strength/Coordination  UE ROM/ Strength/ Coordination: WFL  LE ROM/ Strength/ Coordination: WFL    Sensation:  BUE SILT    Balance:  static/dynamic sitting & standing=mod I-independent    Functional Mobility  Transfer Assistance Needed: No(sit<>stand, functional mobility from bed<>bathroom and toilet transfer=mod I-independent, pushing IV pole)  Bed Mobility Assistance Needed: No(sit>supine=mod I (HOB elevated))      ADLs  ADLs: Needs assistance with ADLs  ADLs - Needs Assistance: Feeding, Grooming, Bathing, Toileting, LB dressing, UB dressing  Feeding - Needs Assistance: (mod I (dentures))  Grooming - Needs Assistance: (mod I standing at sink to wash her hands)  Bathing - Needs Assistance: (mod I (simulated  task))  Toileting - Needs Assistance: (mod I)  UB Dressing - Needs Assistance: (mod I)  LB Dressing - Needs Assistance: (mod I, doff/donns socks using figure four position, pulls mesh briefs down/up mod I for standing balance)      Vitals / Orthostatics  At Rest: NAD  With Activity: NAD      Medical Staff Made Aware: RN Fredric Mare      Occupational Therapy Session Duration  OT Individual - Duration: 18         I attest that I have reviewed the above information.  Signed: Virgina Evener, OT  Filed 03/12/2019

## 2019-03-13 DIAGNOSIS — L732 Hidradenitis suppurativa: Principal | ICD-10-CM

## 2019-03-13 LAB — MAGNESIUM: Magnesium:MCnc:Pt:Ser/Plas:Qn:: 1.5 — ABNORMAL LOW

## 2019-03-13 LAB — URINALYSIS
BACTERIA: NONE SEEN /HPF
BILIRUBIN UA: NEGATIVE
BLOOD UA: NEGATIVE
GLUCOSE UA: NEGATIVE
KETONES UA: NEGATIVE
LEUKOCYTE ESTERASE UA: NEGATIVE
NITRITE UA: NEGATIVE
PH UA: 8 (ref 5.0–9.0)
PROTEIN UA: NEGATIVE
RBC UA: <1 /HPF (ref <=4)
SPECIFIC GRAVITY UA: 1.01 (ref 1.003–1.030)
SQUAMOUS EPITHELIAL: 1 /HPF (ref 0–5)
UROBILINOGEN UA: 0.2
WBC UA: 1 /HPF (ref 0–5)

## 2019-03-13 LAB — CBC
MEAN CORPUSCULAR HEMOGLOBIN CONC: 31.3 g/dL (ref 31.0–37.0)
MEAN CORPUSCULAR HEMOGLOBIN: 27.9 pg (ref 26.0–34.0)
MEAN CORPUSCULAR VOLUME: 89 fL (ref 80.0–100.0)
MEAN PLATELET VOLUME: 8.6 fL (ref 7.0–10.0)
PLATELET COUNT: 410 10*9/L (ref 150–440)
RED BLOOD CELL COUNT: 4.6 10*12/L (ref 4.00–5.20)
RED CELL DISTRIBUTION WIDTH: 13.1 % (ref 12.0–15.0)
WBC ADJUSTED: 15.5 10*9/L — ABNORMAL HIGH (ref 4.5–11.0)

## 2019-03-13 LAB — BASIC METABOLIC PANEL
ANION GAP: 8 mmol/L (ref 7–15)
BUN / CREAT RATIO: 8
CALCIUM: 9.9 mg/dL (ref 8.5–10.2)
CO2: 27 mmol/L (ref 22.0–30.0)
CREATININE: 0.71 mg/dL (ref 0.60–1.00)
EGFR CKD-EPI AA FEMALE: >90 mL/min/{1.73_m2} (ref >=60)
EGFR CKD-EPI NON-AA FEMALE: >90 mL/min/{1.73_m2} (ref >=60)
GLUCOSE RANDOM: 153 mg/dL (ref 70–179)
POTASSIUM: 4.7 mmol/L (ref 3.5–5.0)
SODIUM: 135 mmol/L (ref 135–145)

## 2019-03-13 LAB — BUN / CREAT RATIO: Urea nitrogen/Creatinine:MRto:Pt:Ser/Plas:Qn:: 8

## 2019-03-13 LAB — LEUKOCYTE ESTERASE UA: Leukocyte esterase:PrThr:Pt:Urine:Ord:Test strip: NEGATIVE

## 2019-03-13 LAB — PHOSPHORUS: Phosphate:MCnc:Pt:Ser/Plas:Qn:: 4.7

## 2019-03-13 LAB — WBC ADJUSTED: Leukocytes:NCnc:Pt:Bld:Qn:: 15.5 — ABNORMAL HIGH

## 2019-03-13 NOTE — Unmapped (Signed)
Order was placed for PIV by Venous Access Team (VAT).  Patient was assessed for placement of a PIV. Access was obtained. Blood return noted.  Dressing intact and device well secured.  Flushed with normal saline.  Pt advised to inform RN of any s/s of discomfort at the PIV site.    Workup / Procedure Time:  30 minutes      The primary RN was notified.       Thank you,     Cristal Deer RN Venous Access Team

## 2019-03-13 NOTE — Unmapped (Signed)
No acute events this shift, progressing appropriately, A/O x 4, VSS, O2 sats maintained on RA.     PRN oxy 10mg  x2 in addition of PCA 0.2/8/4 to help with R buttock wound pain.     Surgical dressing remain clean, dry, and intact, changed twice during shift.     PIV maintained with MVF.     Pt ambulated to bathroom with minium assist.     All safety measures in place, will continue to monitor closely.            Problem: Wound  Goal: Optimal Wound Healing  Outcome: Progressing     Problem: Wound  Goal: Optimal Wound Healing  Outcome: Progressing     Problem: Latex Allergy  Goal: Absence of Allergy Symptoms  Outcome: Progressing     Problem: Adult Inpatient Plan of Care  Goal: Plan of Care Review  Outcome: Progressing  Goal: Patient-Specific Goal (Individualization)  Outcome: Progressing  Goal: Absence of Hospital-Acquired Illness or Injury  Outcome: Progressing  Goal: Optimal Comfort and Wellbeing  Outcome: Progressing  Goal: Readiness for Transition of Care  Outcome: Progressing  Goal: Rounds/Family Conference  Outcome: Progressing     Problem: Self-Care Deficit  Goal: Improved Ability to Complete Activities of Daily Living  Outcome: Progressing     Problem: Fall Injury Risk  Goal: Absence of Fall and Fall-Related Injury  Outcome: Progressing     Problem: Asthma Comorbidity  Goal: Maintenance of Asthma Control  Outcome: Progressing

## 2019-03-13 NOTE — Unmapped (Signed)
Plastic Surgery Progress Note      Service/Pager: SRC-1, [phone]  Attending Physician:  Arsenio Katz, *  Date: 03/13/2019      Assessment:  53 y.o. female with [name] stage III hidradenitis suppurativa, who is now 4 Days Post-Op s/p excision of right buttock lesion on 8/25.       Plan:  - Continue local wound care as follows:  - Wet to dry BID with kerlix, ABD, mesh panties - dressing to be done by RN for appropriate premedication timing  - Can change PRN for soilage  - Multimodal pain control- sch tylenol, PRN oxycodone, PRN ibuprofen - DC PCA  - Continue home albuterol, flexeril, lyrica, spirinolactone  - Regular diet  - Wound care education per RN; if patient able to perform, will plan for [facility] upon discharge; if not, will work towards SNF discharge      Subjective  NAEO. Patient motivated to be discharged from hospital - wants to focus on ambulating and better pain control without ambulating. She is also curious about whether or not she can learn wound care on her own - she expresses concern about SNF due to current COVID pandemic.      Physical Exam  Vitals:    03/13/19 1203   BP: 158/82   Pulse: 96   Resp:    Temp:    SpO2: 98%       General: lying in bed, no acute distress  Neuro: alert and oriented. Moves all extremities x 4  Pulmonary: Normal respiratory effort.   Extremities:  Warm, well-perfused  Right buttock largely hemostatic with viable, healthy fat; viiable skin edges. No drainage or odor

## 2019-03-14 LAB — CBC
HEMATOCRIT: 40.8 % (ref 36.0–46.0)
HEMOGLOBIN: 12.8 g/dL (ref 12.0–16.0)
MEAN CORPUSCULAR HEMOGLOBIN CONC: 31.3 g/dL (ref 31.0–37.0)
MEAN CORPUSCULAR VOLUME: 89.4 fL (ref 80.0–100.0)
MEAN PLATELET VOLUME: 7.5 fL (ref 7.0–10.0)
PLATELET COUNT: 398 10*9/L (ref 150–440)
RED BLOOD CELL COUNT: 4.56 10*12/L (ref 4.00–5.20)
RED CELL DISTRIBUTION WIDTH: 13.1 % (ref 12.0–15.0)

## 2019-03-14 LAB — HEPATIC FUNCTION PANEL
ALKALINE PHOSPHATASE: 87 U/L (ref 38–126)
ALT (SGPT): 18 U/L (ref <35)
AST (SGOT): 44 U/L — ABNORMAL HIGH (ref 14–38)
BILIRUBIN DIRECT: <0.1 mg/dL (ref 0.00–0.40)
PROTEIN TOTAL: 8 g/dL (ref 6.5–8.3)

## 2019-03-14 LAB — MEAN PLATELET VOLUME: Lab: 7.5

## 2019-03-14 LAB — ALBUMIN: Albumin:MCnc:Pt:Ser/Plas:Qn:: 3.9

## 2019-03-14 NOTE — Unmapped (Signed)
1 episode of severe pain when showering & removing dressings.  Wet to dry reapplied by RN.  Paged team at patient's request to please add that pain button back.  Toradol added to [facility] and patient reported relief.  Ambulated unit x1.

## 2019-03-14 NOTE — Unmapped (Signed)
ADULT SPECIALTY CARE TEAM SWAB CONSULT    Adult Specialty Care Team to bedside for collection of a(n) nasopharyngeal and oropharyngeal swab(s) for COVID-19    Swabber:  Laury Axon Elder RN  Monitor:  Immunologist.    Appropriate PPE were available and donned per protocol.  Identity of the patient was confirmed via name, medical record number and date of birth.      Prior to specimen collection, team member spoke with primary RN.  Procedure was explained to patient.  Patient was in a normal room with no filter. The patient was swabbed.     PPE was doffed as directed by hospital policy. Sample delivered to lab in person.    Procedure time in room: 15 minutes

## 2019-03-14 NOTE — Unmapped (Signed)
Pt got upset with situation of symptomatic rule out covid-19, education provided,emotional support given, pt stated understanding. Pt has no fever and SOB during shift.    COVID-19 negative this early morning.     A/O x 4, VSS, O2 sats maintained on RA.     PRN oxy 10mg  x1 help with R buttock wound pain. Scheduled Trazone 100mg  given to help sleep, pt slept afterwards.      Surgical dressing remain clean, dry, and intact, changed during shift.     PIV maintained with MVF for abx and IV magnesium.     Pt ambulated to bathroom with minium assist.     All safety measures in place, will continue to monitor closely.            Problem: Wound  Goal: Optimal Wound Healing  Outcome: Progressing     Problem: Wound  Goal: Optimal Wound Healing  Outcome: Progressing     Problem: Latex Allergy  Goal: Absence of Allergy Symptoms  Outcome: Progressing     Problem: Adult Inpatient Plan of Care  Goal: Plan of Care Review  Outcome: Progressing  Goal: Patient-Specific Goal (Individualization)  Outcome: Progressing  Goal: Absence of Hospital-Acquired Illness or Injury  Outcome: Progressing  Goal: Optimal Comfort and Wellbeing  Outcome: Progressing  Goal: Readiness for Transition of Care  Outcome: Progressing  Goal: Rounds/Family Conference  Outcome: Progressing     Problem: Self-Care Deficit  Goal: Improved Ability to Complete Activities of Daily Living  Outcome: Progressing     Problem: Fall Injury Risk  Goal: Absence of Fall and Fall-Related Injury  Outcome: Progressing     Problem: Asthma Comorbidity  Goal: Maintenance of Asthma Control  Outcome: Progressing

## 2019-03-14 NOTE — Unmapped (Signed)
GENERAL INFECTIOUS DISEASES INITIAL CONSULT NOTE      Elizabeth Browning is a 54 y.o. female  being seen in consultation at the request of Arsenio Katz, * for evaluation of fever        Assessment:  53 year old with history of severe HS on humira admitted for planned resection of R buttocks who did well postoperatively now with new fever.     She has defervesced on broad spectrum antibiotics. No clear localizing symptoms but would f/u initial work up and send studies outlined below. If she continues to be febrile will consider imaging to further eval and if cultures remain negative after 48hrs would consider stopping all antibiotics and monitor clinically.       ID Problem List:  Severe HS s/p R buttock excision   - on humira >2 years     Fever  - unclear etiology no localizing symptoms     Treated Hep C  Unprotected sex with one partner       Allergies:  Codeine, Buprenorphine hcl, Adhesive, Iodinated contrast media, Latex, Morphine, and Opioids - morphine analogues        Recommendations:  - continue vancomycin and cefepime   - f/u urine and blood cultures   - send respiratory viral panel  - send HIV ab/ag, RPR,  GC/chlamydia  NAAT from urine - although unlikely that any of these accounted for her current episode of fevers/rigors.      Thank you for involving [place] in the care of this patient. The General ID service - Team A will continue to follow.     Please call the General ID pager with questions at [phone].   Patient seen and discussed with Dr. Hilma Favors, MD  Fellow, [facility] Division of Infectious Diseases        History of Present Illness:      Elizabeth Browning is a 53 y.o. female with [name] 3 HS to buttocks, breasts, groin, and mons/abdomen on Humira since admitted 8/25 for staged excision of HS.     Did well postoperatively but developed new fever 8/29 with rigors, nausea and vomiting.  Leukocytosis to 15,urinalysis without pyuria. Blood and urine cultures pending. Repeat COVID testing negative. CXR with reticular opacities, no recent prior for comparison. She was initially on cefazolin and then broadened to vancomycin and cefepime. She has since defervesced.     Source of information includes:  Review of medical records, discussion with patient, discussion with treating providers.        Medications:   Current antibiotics:  Vanc 8/29-  Cefepime 8/29-    Previous antibiotics:  Cefazolin 8/25-8/28  Clindamycin 8/29    Current/Prior immunomodulators:  Humira       Medical History:  Past Medical History:   Diagnosis Date    Anesthesia to pain     difficulty relaxing and letting anesthesia work- anxiety noted    Anxiety     Arthritis     Asthma     Cancer (CMS-HCC)     cervical    Chronic bronchitis (CMS-HCC)     Chronic hepatitis C without hepatic coma (CMS-HCC)     cleared as of 10/25/16 per patient from [facility]    Colon polyp     Difficult intravenous access     requesting site to be numbed    DOE (dyspnea on exertion)     Dysthymia (RAF-HCC)     Fatigue  Foot pain     GERD (gastroesophageal reflux disease) (RAF-HCC)     Hepatitis C     Loosening of knee joint prosthesis (CMS-HCC)     left    Migraine     MRSA (methicillin resistant staph aureus) culture positive     no + culture found- pt denies-current culture negative    Neuropathic pain     URI (upper respiratory infection)     Vitamin D deficiency        Surgical History:  Past Surgical History:   Procedure Laterality Date    BUNIONECTOMY Bilateral     CARPOMETACARPEL SUSPENSION PLASTY Bilateral     tendon repair    HAND SURGERY Bilateral     trigger finger    HYSTERECTOMY      partial    JOINT REPLACEMENT Left     knee    KNEE SURGERY Left     revision 8 months later post TKR    MOUTH SURGERY      all teeth removed    PR EXC SWEAT GLAND LESN PERINEAL,SIMPL Right 03/09/2019    Procedure: R21 - EXCISION SKIN/SUBCUTANEOUS TISSUE FOR HIDRADENITIS, PERIANAL, PERINEAL, UMBILICAL; SIMPLE/INTERMEDIATE REPR;  Surgeon: Arsenio Katz, MD;  Location: MAIN OR [facility];  Service: Plastics    PR INCIS/DRAIN THIGH/KNEE ABSCESS,DEEP Left 11/11/2016    Procedure: I&D DEEP ABSCESS INFEC BURSA/HEMATOMA THIGH/KNEE;  Surgeon: Lowell Bouton, MD;  Location: OR [facility];  Service: Orthopedics    PR REVISE KNEE JOINT REPLACE,ALL PARTS Left 11/04/2016    Procedure: REVISION LEFT TOTAL KNEE REPLACEMENT, FEMORAL AND TIBIAL COMPARTMENT;  Surgeon: Lowell Bouton, MD;  Location: OR [facility];  Service: Orthopedics    TOE AMPUTATION Right     pinky     TONSSILLECTOMY      TUBAL LIGATION      UMBILICAL HERNIA REPAIR      age 89       Social History:  Social History     Socioeconomic History    Marital status: Single     Spouse name: Not on file    Number of children: Not on file    Years of education: Not on file    Highest education level: Not on file   Occupational History    Not on file   Social Needs    Financial resource strain: Not on file    Food insecurity     Worry: Sometimes true     Inability: Sometimes true    Transportation needs     Medical: Yes     Non-medical: Yes   Tobacco Use    Smoking status: Former Smoker     Packs/day: 0.50     Years: 30.00     Pack years: 15.00     Types: Cigarettes    Smokeless tobacco: Never Used    Tobacco comment: started smoking age as a teen   Substance and Sexual Activity    Alcohol use: No    Drug use: No    Sexual activity: Not on file   Lifestyle    Physical activity     Days per week: Not on file     Minutes per session: Not on file    Stress: Not on file   Relationships    Social connections     Talks on phone: Not on file     Gets together: Not on file     Attends religious service: Not on file  Active member of club or organization: Not on file     Attends meetings of clubs or organizations: Not on file     Relationship status: Not on file   Other Topics Concern    Exercise Not Asked    Living Situation Not Asked    Do you use sunscreen? No    Tanning bed use? No    Are you easily burned? No Excessive sun exposure? No    Blistering sunburns? No   Social History Narrative    Not on file     Tobacco use:   reports that she has quit smoking. Her smoking use included cigarettes. She has a 15.00 pack-year smoking history. She has never used smokeless tobacco.   Alcohol use:    reports no history of alcohol use.   Drug use:    reports no history of drug use.   Living situation:  Lives alone   Residence:   suburban   [place] travel:   Has traveled to [employer] travel:   No travel outside of the [place] Engineer, maintenance service:  Has not served in the [employer]   Employment:  Unemployed   Pets and animal exposure:  No animal exposure   Insect exposure:  No tick exposure   Hobbies:  Denies unusual environmental exposures   TB exposures:  No known TB exposure   Sexual history:  Sex with men and History of STIs gonorrhea and syphillis   Other significant exposures:  Never incarcerated and Never homeless       Family History:  Family History   Problem Relation Age of Onset    Hypertension Mother     Diabetes Mother     Hypertension Father     Cancer Father         Leukemia    Cancer Sister         breast       Immunizations:  Immunization History   Administered Date(s) Administered    Influenza Virus Vaccine, unspecified formulation 06/14/2016       Review of Systems:  12 systems reviewed and negative except as per HPI.       Objective:   Vital signs:  Temp:  [35.6 ??C-38.7 ??C] 35.6 ??C  Heart Rate:  [84-96] 84  Resp:  [16] 16  BP: (117-158)/(67-82) 129/70  MAP (mmHg):  [86-112] 90  SpO2:  [90 %-98 %] 90 %    Physical exam   Implanted Devices/Hardware: none     General: in no acute distress  HEENT: anicteric conjunctivae, moist mucous membranes, no oral lesions  NODES: no cervical or submandibular lymphadenopathy   LUNGS: normal work of breathing, clear bilaterally   CARDIAC: regular rhythm, no murmur  ABDOMINAL: normal bowel sounds, soft, no tenderness  MSK: no edema, no joint effusions   SKIN: no rash or skin breakdown  GU: R buttock with large wound with healthy appearing subcutaneous tissue   NEUROLOGICAL: alert and oriented x3  PSYCH: interactive        Microbiology:    8/29 SAR COV 2 negative  Imaging:

## 2019-03-14 NOTE — Unmapped (Signed)
Order was placed for a PIV by Venous Access Team (VAT).  Patient was assessed for placement of a PIV. Access was obtained. Blood return noted.  Dressing intact and device well secured.  Flushed with normal saline.  Pt advised to inform RN of any s/s of discomfort at the PIV site.    Workup / Procedure Time:  15 minutes       Patient RN was notified.       Thank you,     Melodye Ped RN Venous Access Team

## 2019-03-14 NOTE — Unmapped (Signed)
Vancomycin Therapeutic Monitoring Pharmacy Note    Azura Tufaro is a 53 y.o. female starting vancomycin. Date of therapy initiation: 03/13/2019    Indication: Skin and Soft Tissue Infection (SSTI)    Prior Dosing Information: None/new initiation     Goals:  Therapeutic Drug Levels  Vancomycin trough goal: 15-20 mg/L    Additional Clinical Monitoring/Outcomes  Renal function, volume status (intake and output)    Results: Not applicable    Wt Readings from Last 1 Encounters:   03/09/19 (!) 121.2 kg (267 lb 3.2 oz)     Creatinine   Date Value Ref Range Status   03/13/2019 0.71 0.60 - 1.00 mg/dL Final   16/04/9603 5.40 0.50 - 1.10 mg/dL Final   98/05/9146 8.29 0.50 - 1.10 mg/dL Final        Pharmacokinetic Considerations and Significant Drug Interactions:  ? Adult (estimated initial): Vd = 86 L, ke = 0.08 hr-1  ? Concurrent nephrotoxic meds: not applicable    Assessment/Plan:  Recommendation(s)  ? Start vancomycin 2G Q12h  ? Estimated trough on recommended regimen: 16 mg/L    Follow-up  ? Level due: prior to fourth or fifth dose  ? A pharmacist will continue to monitor and order levels as appropriate    Please page service pharmacist with questions/clarifications.    Hollie Beach, PharmD

## 2019-03-15 LAB — CBC
HEMATOCRIT: 39.2 % (ref 36.0–46.0)
HEMOGLOBIN: 12.3 g/dL (ref 12.0–16.0)
MEAN CORPUSCULAR HEMOGLOBIN CONC: 31.3 g/dL (ref 31.0–37.0)
MEAN CORPUSCULAR HEMOGLOBIN: 28.1 pg (ref 26.0–34.0)
MEAN PLATELET VOLUME: 7.6 fL (ref 7.0–10.0)
PLATELET COUNT: 393 10*9/L (ref 150–440)
RED BLOOD CELL COUNT: 4.37 10*12/L (ref 4.00–5.20)
RED CELL DISTRIBUTION WIDTH: 13.1 % (ref 12.0–15.0)

## 2019-03-15 LAB — EGFR CKD-EPI AA FEMALE: Lab: 78

## 2019-03-15 LAB — MEAN CORPUSCULAR VOLUME: Lab: 89.7

## 2019-03-15 LAB — CREATININE
CREATININE: 0.96 mg/dL (ref 0.60–1.00)
EGFR CKD-EPI AA FEMALE: 78 mL/min/{1.73_m2} (ref >=60)

## 2019-03-15 NOTE — Unmapped (Signed)
VENOUS ACCESS ULTRASOUND PROCEDURE NOTE    Indications:   Poor venous access.    The Venous Access Team has assessed this patient for the placement of a PIV. Ultrasound guidance was necessary to obtain access.     Procedure Details:  Risks, benefits and alternatives discussed with patient. Identity of the patient was confirmed via name, medical record number and date of birth. The availability of the correct equipment was verified.    The vein was identified for ultrasound catheter insertion.  Field was prepared with necessary supplies and equipment.  Probe cover and sterile gel utilized.  Insertion site was prepped with chlorhexidine solution and allowed to dry.  The catheter extension was primed with normal saline.A(n) 20 g x 1.75 inch catheter was placed in the right forearm with 1 attempt(s).     Catheter aspirated, 3 mL blood return present. The catheter was then flushed with 10 mL of normal saline. Insertion site cleansed, and dressing applied per manufacturer guidelines. The catheter was inserted with difficulty due to poor vasculature  by Melodye Ped RN.     Patient RN was notified.     Thank you,     Melodye Ped RN Venous Access Team   [phone]     Workup / Procedure Time:  30 minutes    See vein image below:

## 2019-03-15 NOTE — Unmapped (Signed)
A/O x 4, VSS, O2 sats maintained on RA.     PRN oxy 10mg  x1 help with R buttock wound pain. Benadryl x1 to ease itchy, Scheduled Trazone 50mg  given to help sleep, pt slept afterwards.      Surgical dressing remain clean, dry, and intact, changed during shift.     PIV maintained with MVF for abx and IV magnesium.     Pt ambulated to bathroom with minium assist.     All safety measures in place, will continue to monitor closely.          Problem: Wound  Goal: Optimal Wound Healing  Outcome: Progressing     Problem: Wound  Goal: Optimal Wound Healing  Outcome: Progressing     Problem: Latex Allergy  Goal: Absence of Allergy Symptoms  Outcome: Progressing     Problem: Adult Inpatient Plan of Care  Goal: Plan of Care Review  Outcome: Progressing  Goal: Patient-Specific Goal (Individualization)  Outcome: Progressing  Goal: Absence of Hospital-Acquired Illness or Injury  Outcome: Progressing  Goal: Optimal Comfort and Wellbeing  Outcome: Progressing  Goal: Readiness for Transition of Care  Outcome: Progressing  Goal: Rounds/Family Conference  Outcome: Progressing     Problem: Self-Care Deficit  Goal: Improved Ability to Complete Activities of Daily Living  Outcome: Progressing     Problem: Fall Injury Risk  Goal: Absence of Fall and Fall-Related Injury  Outcome: Progressing     Problem: Asthma Comorbidity  Goal: Maintenance of Asthma Control  Outcome: Progressing

## 2019-03-15 NOTE — Unmapped (Signed)
Plastic Surgery Progress Note      Service/Pager: SRC-1, [phone]  Attending Physician:  Arsenio Katz, *  Date: 03/14/2019      Assessment:  53 y.o. female with [name] stage III hidradenitis suppurativa, who is now 5 Days Post-Op s/p excision of right buttock lesion on 8/25.       Plan:  - Continue local wound care as follows:  - Wet to dry BID with kerlix, ABD, mesh panties - dressing to be done by RN for appropriate premedication timing  - Can change PRN for soilage  - Multimodal pain control- sch tylenol, PRN oxycodone, sch toradol x 48 hours  - Continue home albuterol, flexeril, lyrica, spirinolactone  - Regular diet  - ID consultation for further management, work up  - Continue broad spectrum for now  - Recommending:  LFTs, additional infectious work up (RPR, HIV, GC/chlamydia, syphillis) given patient's prior history  - Wound care education per RN; if patient able to perform, will plan for [facility] upon discharge; if not, will work towards SNF discharge      Subjective  Sudden onset fevers, chills, rigors yesterday with associated vomiting. Infectious work up initiated, broad spectrum antibiotics started. CXR notable for reticular opacities, ?infection. ID consulted for additional work up      Physical Exam  Vitals:    03/14/19 1258   BP: 114/55   Pulse: 82   Resp: 17   Temp: 35.7 ??C   SpO2: 95%       General: lying in bed, no acute distress  Neuro: alert and oriented. Moves all extremities x 4  Pulmonary: Normal respiratory effort.   Extremities:  Warm, well-perfused  Right buttock largely hemostatic with viable, healthy fat; viiable skin edges. No drainage or odor      Labs & Studies  Lab Results   Component Value Date    WBC 13.5 (H) 03/14/2019    HGB 12.8 03/14/2019    HCT 40.8 03/14/2019    PLT 398 03/14/2019       Lab Results   Component Value Date    NA 135 03/13/2019    K 4.7 03/13/2019    CL 100 03/13/2019    CO2 27.0 03/13/2019    BUN 6 (L) 03/13/2019    CREATININE 0.71 03/13/2019    GLU 153 03/13/2019    CALCIUM 9.9 03/13/2019    MG 1.5 (L) 03/13/2019    PHOS 4.7 03/13/2019       Lab Results   Component Value Date    BILITOT 0.4 03/13/2019    BILIDIR <0.10 03/13/2019    PROT 8.0 03/13/2019    ALBUMIN 3.9 03/13/2019    ALT 18 03/13/2019    AST 44 (H) 03/13/2019    ALKPHOS 87 03/13/2019

## 2019-03-15 NOTE — Unmapped (Signed)
Plastic Surgery Progress Note      Service/Pager: SRC-1, [phone]  Attending Physician:  Arsenio Katz, *  Date: 03/15/2019      Assessment:  53 y.o. female with [name] stage III hidradenitis suppurativa, who is now 6 Days Post-Op s/p excision of right buttock lesion on 8/25.       Plan:  - Continue local wound care as follows:  - Wet to dry BID with kerlix, ABD, mesh panties - dressing to be done by RN for appropriate premedication timing  - Can change PRN for soilage  - Multimodal pain control- sch tylenol, PRN oxycodone, sch toradol x 48 hours  - Continue home albuterol, flexeril, lyrica, spirinolactone  - Regular diet  - 8/29 blood cx: no growth at 24 hrs  - 8/29 urine cx: no growth  - ID consultation for further management, work up  - Okay to DC antibiotics per ID  - Recommends consulting derm for HS, will hold off for now   - F/u RPR, RPP, HIV, RPR, GC/chalmydia NAAT   - F/u blood and urine cultures  - Wound care education per RN; if patient able to perform, will plan for [facility] upon discharge; if not, will work towards SNF discharge      Subjective  NAEON. Afebrile and hemodynamically stable overnight. Patient originally scheduled to go to SNF today, however, will keep patient to follow through with infectious work-up and pain control.     Physical Exam  Vitals:    03/15/19 0428   BP: 106/64   Pulse: 88   Resp: 18   Temp: 36.5 ??C   SpO2: 93%       General: lying in bed, no acute distress  Neuro: alert and oriented. Moves all extremities x 4  Pulmonary: Normal respiratory effort.   Extremities:  Warm, well-perfused  Right buttock largely hemostatic with viable, healthy fat; viable skin edges. No drainage or odor      Labs & Studies  Lab Results   Component Value Date    WBC 13.7 (H) 03/15/2019    HGB 12.3 03/15/2019    HCT 39.2 03/15/2019    PLT 393 03/15/2019       Lab Results   Component Value Date    NA 135 03/13/2019    K 4.7 03/13/2019    CL 100 03/13/2019    CO2 27.0 03/13/2019    BUN 6 (L) 03/13/2019    CREATININE 0.71 03/13/2019    GLU 153 03/13/2019    CALCIUM 9.9 03/13/2019    MG 1.5 (L) 03/13/2019    PHOS 4.7 03/13/2019       Lab Results   Component Value Date    BILITOT 0.4 03/13/2019    BILIDIR <0.10 03/13/2019    PROT 8.0 03/13/2019    ALBUMIN 3.9 03/13/2019    ALT 18 03/13/2019    AST 44 (H) 03/13/2019    ALKPHOS 87 03/13/2019

## 2019-03-16 LAB — SYPHILIS RPR SCREEN: Reagin Ab:PrThr:Pt:Ser:Ord:RPR: NONREACTIVE

## 2019-03-16 LAB — HIV ANTIGEN/ANTIBODY COMBO: HIV 1+2 Ab+HIV1 p24 Ag:PrThr:Pt:Ser/Plas:Ord:IA: NONREACTIVE

## 2019-03-16 NOTE — Unmapped (Signed)
No acute events this shift. VSS, patient on room air. Patient is alert and oriented.   Patient has been voiding adequately, BM this shift.   Patient tolerating diet, no complaints of N/V.  Pain managed with with scheduled toradol.   Patient's dressing changed this shift.   All safety precautions in place. Will continue to monitor.       Problem: Wound  Goal: Optimal Wound Healing  Outcome: Progressing     Problem: Latex Allergy  Goal: Absence of Allergy Symptoms  Outcome: Progressing     Problem: Adult Inpatient Plan of Care  Goal: Plan of Care Review  Outcome: Progressing  Goal: Patient-Specific Goal (Individualization)  Outcome: Progressing  Goal: Absence of Hospital-Acquired Illness or Injury  Outcome: Progressing  Goal: Optimal Comfort and Wellbeing  Outcome: Progressing  Goal: Readiness for Transition of Care  Outcome: Progressing  Goal: Rounds/Family Conference  Outcome: Progressing     Problem: Self-Care Deficit  Goal: Improved Ability to Complete Activities of Daily Living  Outcome: Progressing     Problem: Fall Injury Risk  Goal: Absence of Fall and Fall-Related Injury  Outcome: Progressing     Problem: Asthma Comorbidity  Goal: Maintenance of Asthma Control  Outcome: Progressing     Problem: Wound  Goal: Optimal Wound Healing  Outcome: Progressing     Problem: Infection  Goal: Infection Symptom Resolution  Outcome: Progressing

## 2019-03-16 NOTE — Unmapped (Signed)
No acute events this shift, progressing appropriately. AO x 4. VSS. O2 sat maintained on RA. R buttocks dressing was changed around 0400 after patient's shower. She tolerating it well. PIV is saline locked. Pain has been controlled with PO Oxycodone. Tolerating a regular diet with adequate fluid intake. Voiding independently with adequate output so far this shift. Patient ambulates around the unit with a rollator and is able to reposition in the bed as needed.  All safety measures in place, will continue to monitor.     Problem: Wound  Goal: Optimal Wound Healing  Outcome: Progressing     Problem: Wound  Goal: Optimal Wound Healing  Outcome: Progressing     Problem: Latex Allergy  Goal: Absence of Allergy Symptoms  Outcome: Progressing     Problem: Adult Inpatient Plan of Care  Goal: Plan of Care Review  Outcome: Progressing  Goal: Patient-Specific Goal (Individualization)  Outcome: Progressing  Goal: Absence of Hospital-Acquired Illness or Injury  Outcome: Progressing  Goal: Optimal Comfort and Wellbeing  Outcome: Progressing  Goal: Readiness for Transition of Care  Outcome: Progressing  Goal: Rounds/Family Conference  Outcome: Progressing     Problem: Self-Care Deficit  Goal: Improved Ability to Complete Activities of Daily Living  Outcome: Progressing     Problem: Fall Injury Risk  Goal: Absence of Fall and Fall-Related Injury  Outcome: Progressing     Problem: Asthma Comorbidity  Goal: Maintenance of Asthma Control  Outcome: Progressing

## 2019-03-16 NOTE — Unmapped (Signed)
Plastic Surgery Progress Note      Service/Pager: SRC-1, [phone]  Attending Physician:  Arsenio Katz, *  Date: 03/16/2019      Assessment:  53 y.o. female with [name] stage III hidradenitis suppurativa, who is now 7 Days Post-Op s/p excision of right buttock lesion on 8/25.       Plan:  - Continue local wound care as follows:  - Wet to dry BID with kerlix, ABD, mesh panties - dressing to be done by RN for appropriate premedication timing  - Can change PRN for soilage  - Multimodal pain control- sch tylenol, PRN oxycodone, DC toradol  - Continue home albuterol, flexeril, lyrica, spirinolactone  - Regular diet  - 8/29 blood cx: no growth at 48 hrs  - ID consultation for further management - work up negative    Dispo: per CM, HH agencies refused referral; will revisit SNF with patient  - Anticipated discharge 9/2      Subjective  No acute events    Physical Exam  Vitals:    03/16/19 0450   BP: 99/52   Pulse: 82   Resp: 18   Temp: 36.2 ??C   SpO2: 95%       General: lying in bed, no acute distress  Neuro: alert and oriented. Moves all extremities x 4  Pulmonary: Normal respiratory effort.   Extremities:  Warm, well-perfused  Right buttock largely hemostatic with viable, healthy fat; viable skin edges. No drainage or odor      Labs & Studies  Lab Results   Component Value Date    WBC 13.7 (H) 03/15/2019    HGB 12.3 03/15/2019    HCT 39.2 03/15/2019    PLT 393 03/15/2019       Lab Results   Component Value Date    NA 135 03/13/2019    K 4.7 03/13/2019    CL 100 03/13/2019    CO2 27.0 03/13/2019    BUN 6 (L) 03/13/2019    CREATININE 0.96 03/15/2019    GLU 153 03/13/2019    CALCIUM 9.9 03/13/2019    MG 1.5 (L) 03/13/2019    PHOS 4.7 03/13/2019       Lab Results   Component Value Date    BILITOT 0.4 03/13/2019    BILIDIR <0.10 03/13/2019    PROT 8.0 03/13/2019    ALBUMIN 3.9 03/13/2019    ALT 18 03/13/2019    AST 44 (H) 03/13/2019    ALKPHOS 87 03/13/2019

## 2019-03-16 NOTE — Unmapped (Signed)
GENERAL INFECTIOUS DISEASES CONSULT NOTE      Assessment:  53 year old with history of severe HS on humira admitted for planned resection of R buttocks who did well postoperatively now with new fever.   ??  She has defervesced on broad spectrum antibiotics though is on scheduled tylenol. No clear localizing symptoms and cultures remain negative. Recommend stopping antibiotics and discussing HS/fever with derm to see if they have any further input.       ID Problem List:  Severe HS s/p R buttock excision on 8/25  - on humira >2 years   ??  Fever  - unclear etiology no localizing symptoms   - afebrile but has been on ATC tylenol  - final UCx NG  - 8/29 BCx x2 NGTD  ??  Treated Hep C  Unprotected sex with one partner       Recommendations:  - stop vanc/cefepime and monitor, keep in mind on scheduled tylenol which may mask temp  - recommend discussing HS with fever with dermatology as they may have further recommendations re med management      Thank you for involving [place] in the care of this patient. The General ID service Team A will sign off.   Please call the General ID pager with questions at [phone].   Patient discussed with Dr. Ian Bushman.     Maryjean Morn, MD  Infectious Disease Fellow, PGY-4      Interval events and ROS:  No acute events. Remains afebrile however on scheduled tylenol. Patient reports feeling well and denies f/c/n/v/d/abd pain, SOB and cough. Says pain controlled.     Allergies:  Codeine, Buprenorphine hcl, Adhesive, Iodinated contrast media, Latex, Morphine, and Opioids - morphine analogues      Medications:   Current antibiotics:  Vanc 8/29-  Cefepime 8/29-  ??  Previous antibiotics:  Cefazolin 8/25-8/28  Clindamycin 8/29  ??  Current/Prior immunomodulators:  Humira     Other medications reviewed.       Objective:   Vital signs:  Temp:  [35.8 ??C-36.5 ??C] 36.3 ??C  Heart Rate:  [88-97] 89  Resp:  [18] 18  BP: (104-106)/(57-69) 104/69  MAP (mmHg):  [76-79] 79  SpO2:  [93 %-96 %] 96 %    Physical exam General: in no acute distress, non-toxic appearing  HEENT: anicteric conjunctivae, moist mucous membranes, no oral lesions  LUNGS: normal work of breathing, clear bilaterally   CARDIAC: regular rhythm without murmurs  ABDOMINAL: normal bowel sounds, soft, no tenderness  NEUROLOGICAL: alert and conversational      Labs:    Lab Results   Component Value Date    WBC 13.7 (H) 03/15/2019    NEUTROABS 2.9 12/25/2016    LYMPHSABS 3.6 12/25/2016    EOSABS 0.2 12/25/2016    HGB 12.3 03/15/2019    HCT 39.2 03/15/2019    PLT 393 03/15/2019       Lab Results   Component Value Date    NA 135 03/13/2019    K 4.7 03/13/2019    CL 100 03/13/2019    CO2 27.0 03/13/2019    BUN 6 (L) 03/13/2019    CREATININE 0.96 03/15/2019    CALCIUM 9.9 03/13/2019    MG 1.5 (L) 03/13/2019    PHOS 4.7 03/13/2019       Lab Results   Component Value Date    ALKPHOS 87 03/13/2019    BILITOT 0.4 03/13/2019    BILIDIR <0.10 03/13/2019    PROT 8.0  03/13/2019    ALBUMIN 3.9 03/13/2019    ALT 18 03/13/2019    AST 44 (H) 03/13/2019       Lab Results   Component Value Date    ESR 28 12/25/2016    ESR 58 (H) 11/11/2016    CRP <5.0 12/25/2016    CRP 15.5 (H) 11/11/2016         Drug monitoring:  No results found for: VANCORANDOM, VANCOTROUGH, TOBRAMYCIN, VORILVL, POSALVL, DESALKFLURQT, AMIKACIN, Rufina Falco      Microbiology:    Microbiology Results (last day)     Procedure Component Value Date/Time Date/Time    Chlamydia/Gonorrhoeae NAA [[id number]] Collected: 03/15/19 1553    Lab Status: In process Specimen: Swab from Endocervix Updated: 03/15/19 1606    Blood Culture, Adult [[id number]] Collected: 03/13/19 1342    Lab Status: Preliminary result Specimen: Blood from 1 Peripheral Draw Updated: 03/15/19 1415     Blood Culture, Routine No Growth at 48 hours    Blood Culture, Adult [[id number]] Collected: 03/13/19 1343    Lab Status: Preliminary result Specimen: Blood from 1 Peripheral Draw Updated: 03/15/19 1400     Blood Culture, Routine No Growth at 48 hours              Studies:   No results found.

## 2019-03-17 MED ORDER — CYCLOBENZAPRINE 5 MG TABLET
ORAL_TABLET | Freq: Two times a day (BID) | ORAL | 0 refills | 7.00000 days | Status: CP | PRN
Start: 2019-03-17 — End: 2019-03-24

## 2019-03-17 MED ORDER — CYCLOBENZAPRINE 5 MG TABLET: 5 mg | tablet | Freq: Two times a day (BID) | 0 refills | 7 days | Status: AC

## 2019-03-17 MED ORDER — OXYCODONE 5 MG TABLET: 5 mg | tablet | Freq: Four times a day (QID) | 0 refills | 8 days | Status: AC

## 2019-03-17 MED ORDER — OXYCODONE 5 MG TABLET
ORAL_TABLET | Freq: Four times a day (QID) | ORAL | 0 refills | 8.00000 days | Status: CP | PRN
Start: 2019-03-17 — End: 2019-03-17

## 2019-03-17 NOTE — Unmapped (Signed)
No acute events this shift. VSS, patient on room air. Patient is alert and oriented.   Patient has been voiding adequately, BM this shift.   Patient tolerating diet, no complaints of N/V.  Pain managed with prn oxy x1.  Patient's dressing changed this shift x2.   All safety precautions in place. Will continue to monitor.       Problem: Wound  Goal: Optimal Wound Healing  Outcome: Progressing     Problem: Latex Allergy  Goal: Absence of Allergy Symptoms  Outcome: Progressing     Problem: Adult Inpatient Plan of Care  Goal: Plan of Care Review  Outcome: Progressing  Goal: Patient-Specific Goal (Individualization)  Outcome: Progressing  Goal: Absence of Hospital-Acquired Illness or Injury  Outcome: Progressing  Goal: Optimal Comfort and Wellbeing  Outcome: Progressing  Goal: Readiness for Transition of Care  Outcome: Progressing  Goal: Rounds/Family Conference  Outcome: Progressing     Problem: Self-Care Deficit  Goal: Improved Ability to Complete Activities of Daily Living  Outcome: Progressing     Problem: Fall Injury Risk  Goal: Absence of Fall and Fall-Related Injury  Outcome: Progressing     Problem: Asthma Comorbidity  Goal: Maintenance of Asthma Control  Outcome: Progressing     Problem: Wound  Goal: Optimal Wound Healing  Outcome: Progressing     Problem: Infection  Goal: Infection Symptom Resolution  Outcome: Progressing

## 2019-03-17 NOTE — Unmapped (Signed)
Discharge Summary    Admit date: 03/09/2019    Discharge date and time:  03/17/2019    Discharge to:  Home    Discharge Service: Surg Plastic [facility])    Discharge Attending Physician: Arsenio Katz, *    Discharge  Diagnoses: Right gluteal hidradenitis    Secondary Diagnosis: Principal Problem:    Hidradenitis suppurativa      OR Procedures: Right - R21 - EXCISION SKIN/SUBCUTANEOUS TISSUE FOR HIDRADENITIS, PERIANAL, PERINEAL, UMBILICAL; SIMPLE/INTERMEDIATE REPR 03/09/2019     Ancillary Procedures: no procedures    Discharge Day Services: The patient was seen and examined by the Plastic Surgery team on the day of discharge.  Vital signs and laboratory values were stable and within normal limits.  Surgical wounds were examined.  Discharge plan was discussed, instructions for home care were given, and all questions answered.    Subjective  No acute events overnight. Pain Controlled. No fever or chills.    Objective   BP 115/60  - Pulse 94  - Temp 35.8 ??C (Oral)  - Resp 18  - Ht 177.8 cm (5' 10)  - Wt (!) 121.2 kg (267 lb 3.2 oz)  - SpO2 95%  - BMI 38.34 kg/m??     General: Sitting up in bed, well-appearing  HENT:  EOMI, trachea midline, moist mucous membranes  Resp:  Non labored breathing on RA  CV:  RRR no MRG  Abd:  NT ND  Extremities: No pedal edema  Buttock: Dressing clean, dry, and intact. No bleeding.  Neuro:  Grossly intact  Psych:  Mood and affect appropriate and congruent    Hospital Course:   The patient was taken to the OR on 03/09/2019 for a excision of right gluteal hidradenitis. She tolerated the procedure well, was extubated in the OR, and was taken to the PACU where she received routine postoperative care before being transferred to the floor. She did well postoperatively. Her diet was slowly advanced and at the time of discharge she was tolerating the expected diet. The patient was able to void spontaneously, have her pain controlled with P.O. pain medication, and ambulate with minimal assistance. Prior to discharge, wound care teaching was provided to patient and patient's friend who will assist with dressing changes. She is being discharged home on POD 8 in stable condition.    Condition at Discharge: Improved  Discharge Medications:      Your Medication List      STOP taking these medications    fluticasone propionate 50 mcg/actuation nasal spray  Commonly known as: FLONASE     HUMIRA(CF) PEN 40 mg/0.4 mL injection  Generic drug: adalimumab     VRAYLAR 3 mg Cap capsule  Generic drug: cariprazine        START taking these medications    cyclobenzaprine 5 MG tablet  Commonly known as: FLEXERIL  Take 1 tablet (5 mg total) by mouth two (2) times a day as needed for muscle spasms for up to 7 days.     oxyCODONE 5 MG immediate release tablet  Commonly known as: ROXICODONE  Take 1 tablet (5 mg total) by mouth every six (6) hours as needed for up to 5 days.        CONTINUE taking these medications    DEXILANT 60 mg capsule  Generic drug: dexlansoprazole  TK 1 C PO QAM FOR HEARTBURN AND ABDOMINAL PAIN     empty container Misc  use as directed     traZODone 50 MG tablet  Commonly known as: DESYREL  Take 50-100 mg by mouth nightly.                 Discharge Instructions:    Other Instructions     Discharge instructions      I certify that based on my evaluation of this patient, this patient requires BLS transportation services and that other forms of transport are contraindicated.  Please refer to care management transitions note for transportation details.         Discharge instructions      [facility] Plastic Surgery  Discharge Instructions for Elizabeth Browning, M.D.      Procedure performed: Excision of right buttock hidradenitis    Medications:    Antibiotics:  - You do not require antibiotics after surgery.    Pain and pain medication:  - You have been prescribed a narcotic pain medication. Take this as directed. Do not drive, operate heavy machinery, or make important decisions while taking these medications. Do not drink alcohol or take illegal drugs while on narcotic pain medication. These medications may cause constipation, itching, and nausea.  - You may alternate acetaminophen (Tylenol) and ibuprofen (Advil or Motrin) over the counter for pain relief. Follow the instructions on the bottle. Do not exceed 4 grams (4,000 mg) of acetaminophen/Tylenol per day.    Stool softeners:  - Prescription narcotic pain medication, such as oxycodone, Percocet, or Vicodin, can cause constipation. Anaesthesia may also cause constipation. If you become constipated after surgery, you should take polyethylene glycol (Miralax) or sennokot (Senna) to help you have regular bowel movements. Follow the instructions on the bottle.  - If you are constipated after surgery, do not use Colace, as this can make constipation worse while you are taking narcotics.  - If you are constipated after surgery, you should ensure that you have adequate fiber in your diet (>25 grams per day) and drink at least 64oz of water daily. If you become constipated even after using these medications, you may take magnesium citrate. Follow the instructions on the bottle.    Blood thinners:  - You do not need a prescription blood thinner after surgery.    Home medications:  - You should resume your home medications, except certain medications as directed in your discharge paperwork. Your discharge paperwork has a list of changes to how you should take your home medications.        Restrictions:    Activity Restrictions:  - Do not exercise, No exercise, no strenuous activity, no yoga. Do not lift any objects heavier than 5 lbs.    What foods can I eat?:  Normal: You may resume eating normal foods. You do not have any food restrictions.    Showering and Bathing:  - You may shower 24 hours after surgery. Allow warm water and soap to run over the surgical site. Dab the area dry, but do not scrub the area. Keep the surgical site clean and dry at all times otherwise.  - Absolutely no submerging under water. No baths, no hot tubs, no swimming, no pools, no lakes, no rivers, no oceans, etc. Doing so will significantly increase your risk of infection.        Wounds and Dressings:    Wounds and Dressings:  - You have a wet-to-dry dressing in place. You should change this dressing two times per day as instructed. You may shower to assist with removal of dressings.  - Keep your dressing(s) clean and dry. If you see  some light bloody wound seepage through the bandage, do not worry as this is normal. Replace your dressings if needed.  - Continue dressing the site(s) of surgery until your follow up appointment.      When should I call my surgeon?:    When should I call?:  In case of emergency:  - Please call 911 or go to the nearest emergency room.    Please call if you develop any of the following symptoms:  - Fever, i.e. temperature greater than 101.3??F (or 38.5??C) for adults, or 100.4??F (or 38.0??C) for children  - Persistent nausea or vomiting that does not go away  - Severe pain that cannot be controlled with prescription and over-the-counter pain medication  - Rapidly increasing swelling of at the site of surgery  - Redness, tenderness, foul odor, or thick green or yellow discharge from a surgical site  - Large amounts of blood coming from your incisions or surgical sites  - Any other concerning symptom    Who should I call?:  In case of emergency, call 911 or go to the nearest emergency room.    For Monday through Friday, 8AM to 5PM:  - Dr. Dorothy Spark nurses are Elizabeth Pigeon, Elizabeth Browning; and Elizabeth Bottom, Elizabeth Browning. Joni Reining can be reached at [phone], or via e-mail at [email or web address] Gean Maidens can be reached at [phone]) [phone], or via e-mail at [email or web address]  - If you are unable to reach the above nurse, please call the Plastic Surgery clinic at [phone].    For Monday through Friday, after 5PM; and for weekends, 24/7:  - If you have a scheduling question, please call Monday through Friday during business hours.  - If you have an urgent medical issue related to your surgery, please call the [facility] operator at [phone], and ask them to page the Plastic Surgery resident on call. The operator will contact the Plastic Surgery resident, and you should receive a call back within 1-2 hours.    Plastic Surgery Clinics:  [facility], [facility] - [phone]  [facility] - [phone]  [facility], [facility] - [phone]        Follow-Up Appointments:  Your follow-up appointment has already been scheduled. Please see your discharge paperwork for the time and date. To change your follow-up appointment time, please call the Plastic Surgery clinic at [phone].             Labs and Other Follow-ups after Discharge:  Follow Up instructions and Outpatient Referrals     Discharge instructions      Discharge instructions          Future Appointments:  Appointments which have been scheduled for you    Mar 29, 2019 12:30 PM  (Arrive by 12:00 PM)  POST OP with Arsenio Katz, MD  [facility] PLASTIC AND HAND SURGERY [facility]) [address]  [address]  [place] [address]  [phone]

## 2019-03-17 NOTE — Unmapped (Signed)
No acute events this shift, progressing appropriately. AO x 4. VSS. O2 sat maintained on RA. R buttocks dressing was changed during a video call with a friend. She tolerating it well. PIV is saline locked. Pain has been controlled with PO Oxycodone. Tolerating a regular diet with adequate fluid intake. Voiding independently with adequate output so far this shift. Patient ambulates around the unit with a rollator and is able to reposition in the bed as needed.  All safety measures in place, will continue to monitor.       Problem: Adult Inpatient Plan of Care  Goal: Plan of Care Review  Outcome: Progressing  Goal: Patient-Specific Goal (Individualization)  Outcome: Progressing  Goal: Absence of Hospital-Acquired Illness or Injury  Outcome: Progressing  Goal: Optimal Comfort and Wellbeing  Outcome: Progressing  Goal: Readiness for Transition of Care  Outcome: Progressing  Goal: Rounds/Family Conference  Outcome: Progressing     Problem: Latex Allergy  Goal: Absence of Allergy Symptoms  Outcome: Progressing     Problem: Wound  Goal: Optimal Wound Healing  Outcome: Progressing     Problem: Wound  Goal: Optimal Wound Healing  Outcome: Progressing

## 2019-03-23 MED ORDER — OXYCODONE 5 MG TABLET
ORAL_TABLET | ORAL | 0 refills | 5 days | Status: CP | PRN
Start: 2019-03-23 — End: 2019-03-28

## 2019-03-29 ENCOUNTER — Ambulatory Visit: Admit: 2019-03-29 | Discharge: 2019-03-30 | Payer: MEDICAID

## 2019-03-29 DIAGNOSIS — L732 Hidradenitis suppurativa: Secondary | ICD-10-CM

## 2019-03-29 NOTE — Unmapped (Signed)
Dressing applied (Xeroform and ABD)  to R buttocks per provider orders.

## 2019-04-01 DIAGNOSIS — L732 Hidradenitis suppurativa: Secondary | ICD-10-CM

## 2019-04-01 MED ORDER — ADALIMUMAB PEN CITRATE FREE 40 MG/0.4 ML
11 refills | 0 days | Status: CP
Start: 2019-04-01 — End: ?
  Filled 2019-04-06: qty 8, 28d supply, fill #0

## 2019-04-01 NOTE — Unmapped (Signed)
Elizabeth Browning just had plastic surgery for an HS area on her buttock. She reports it's healing well, but she's having trouble obtaining bandages, and had had some trouble reaching the plastic surgery office. I will loop them in as well.    She has continued on 80mg /week during this time. Her order was d/c after admission, so will request a new order.     [facility] Clinical Assessment & Refill Coordination Note    Elizabeth Browning, DOB: [birth year]  Phone: [phone] (home)     All above HIPAA information was verified with patient.     Specialty Medication(s):   Inflammatory Disorders: Humira     Current Outpatient Medications   Medication Sig Dispense Refill   ??? DEXILANT 60 mg capsule TK 1 C PO QAM FOR HEARTBURN AND ABDOMINAL PAIN     ??? empty container Misc use as directed 1 each 2   ??? traZODone (DESYREL) 50 MG tablet Take 50-100 mg by mouth nightly.       No current facility-administered medications for this visit.         Changes to medications: Elizabeth Browning reports no changes at this time.    Allergies   Allergen Reactions   ??? Codeine Rash, Hives and Itching   ??? Buprenorphine Hcl      Unknown-pt denies   ??? Adhesive Rash   ??? Iodinated Contrast Media Rash   ??? Latex Rash   ??? Morphine Itching   ??? Opioids - Morphine Analogues Itching     Unknown-pt denies         Changes to allergies: No    SPECIALTY MEDICATION ADHERENCE     Humira - 1 dose left     Specialty medication(s) dose(s) confirmed: Regimen is correct and unchanged.     Are there any concerns with adherence? No    Adherence counseling provided? Not needed    CLINICAL MANAGEMENT AND INTERVENTION      Clinical Benefit Assessment:    Do you feel the medicine is effective or helping your condition? Yes    Clinical Benefit counseling provided? Not needed    Adverse Effects Assessment:    Are you experiencing any side effects? No    Are you experiencing difficulty administering your medicine? No    Quality of Life Assessment: How many days over the past month did your HS  keep you from your normal activities? For example, brushing your teeth or getting up in the morning. 0    Have you discussed this with your provider? Not needed    Therapy Appropriateness:    Is therapy appropriate? Yes, therapy is appropriate and should be continued    DISEASE/MEDICATION-SPECIFIC INFORMATION      For patients on injectable medications: Patient currently has 1 doses left.  Next injection is scheduled for tomorrow.    PATIENT SPECIFIC NEEDS     ? Does the patient have any physical, cognitive, or cultural barriers? No    ? Is the patient high risk? No     ? Does the patient require a Care Management Plan? No     ? Does the patient require physician intervention or other additional services (i.e. nutrition, smoking cessation, social work)? No      SHIPPING     Specialty Medication(s) to be Shipped:   Inflammatory Disorders: Humira    Other medication(s) to be shipped: na     Changes to insurance: No    Delivery Scheduled: Yes, Expected medication  delivery date: Wed, Sept 23.     Medication will be delivered via UPS to the confirmed home address in [facility].    The patient will receive a drug information handout for each medication shipped and additional FDA Medication Guides as required.  Verified that patient has previously received a Conservation officer, historic buildings.    All of the patient's questions and concerns have been addressed.    Lanney Gins   [facility] Specialty Pharmacist

## 2019-04-02 NOTE — Unmapped (Signed)
CC:     HPI:  Kendelle Schweers is a 53 y.o. female who is here today as a post op check after HS excision of right buttock.  She has been doing daily (sometimes BID) WTD dressing changes.  She has been tolerating these well.  Pain is well controlled.  Pathology confirmed consistency with HS.        Past Medical History:  Past Medical History:   Diagnosis Date   ??? Anesthesia to pain     difficulty relaxing and letting anesthesia work- anxiety noted   ??? Anxiety    ??? Arthritis    ??? Asthma    ??? Cancer (CMS-HCC)     cervical   ??? Chronic bronchitis (CMS-HCC)    ??? Chronic hepatitis C without hepatic coma (CMS-HCC)     cleared as of 10/25/16 per patient from [facility]   ??? Colon polyp    ??? Difficult intravenous access     requesting site to be numbed   ??? DOE (dyspnea on exertion)    ??? Dysthymia (RAF-HCC)    ??? Fatigue    ??? Foot pain    ??? GERD (gastroesophageal reflux disease) (RAF-HCC)    ??? Hepatitis C    ??? Loosening of knee joint prosthesis (CMS-HCC)     left   ??? Migraine    ??? MRSA (methicillin resistant staph aureus) culture positive     no + culture found- pt denies-current culture negative   ??? Neuropathic pain    ??? URI (upper respiratory infection)    ??? Vitamin D deficiency        Past Surgical History:  Past Surgical History:   Procedure Laterality Date   ??? BUNIONECTOMY Bilateral    ??? CARPOMETACARPEL SUSPENSION PLASTY Bilateral     tendon repair   ??? HAND SURGERY Bilateral     trigger finger   ??? HYSTERECTOMY      partial   ??? JOINT REPLACEMENT Left     knee   ??? KNEE SURGERY Left     revision 8 months later post TKR   ??? MOUTH SURGERY      all teeth removed   ??? PR EXC SWEAT GLAND LESN PERINEAL,SIMPL Right 03/09/2019    Procedure: R21 - EXCISION SKIN/SUBCUTANEOUS TISSUE FOR HIDRADENITIS, PERIANAL, PERINEAL, UMBILICAL; SIMPLE/INTERMEDIATE REPR;  Surgeon: Arsenio Katz, MD;  Location: MAIN OR [facility];  Service: Plastics   ??? PR INCIS/DRAIN THIGH/KNEE ABSCESS,DEEP Left 11/11/2016    Procedure: I&D DEEP ABSCESS INFEC BURSA/HEMATOMA THIGH/KNEE;  Surgeon: Lowell Bouton, MD;  Location: OR [facility];  Service: Orthopedics   ??? PR REVISE KNEE JOINT REPLACE,ALL PARTS Left 11/04/2016    Procedure: REVISION LEFT TOTAL KNEE REPLACEMENT, FEMORAL AND TIBIAL COMPARTMENT;  Surgeon: Lowell Bouton, MD;  Location: OR [facility];  Service: Orthopedics   ??? TOE AMPUTATION Right     pinky    ??? TONSSILLECTOMY     ??? TUBAL LIGATION     ??? UMBILICAL HERNIA REPAIR      age 65       Medications:  Current Outpatient Medications   Medication Sig Dispense Refill   ??? DEXILANT 60 mg capsule TK 1 C PO QAM FOR HEARTBURN AND ABDOMINAL PAIN     ??? empty container Misc use as directed 1 each 2   ??? traZODone (DESYREL) 50 MG tablet Take 50-100 mg by mouth nightly.     ??? ADALIMUMAB PEN CITRATE FREE 40 MG/0.4 ML Inject the contents of 2 pens (  80mg ) under the skin every 7 days as directed by prescriber 8 each 11     No current facility-administered medications for this visit.        Allergies:  Allergies   Allergen Reactions   ??? Codeine Rash, Hives and Itching   ??? Buprenorphine Hcl      Unknown-pt denies   ??? Adhesive Rash   ??? Iodinated Contrast Media Rash   ??? Latex Rash   ??? Morphine Itching   ??? Opioids - Morphine Analogues Itching     Unknown-pt denies         Family History:   Family History   Problem Relation Age of Onset   ??? Hypertension Mother    ??? Diabetes Mother    ??? Hypertension Father    ??? Cancer Father         Leukemia   ??? Cancer Sister         breast    The patient reports no family history for bleeding disorders or anesthetic problems.    Social History:  Social History     Socioeconomic History   ??? Marital status: Single     Spouse name: Not on file   ??? Number of children: Not on file   ??? Years of education: Not on file   ??? Highest education level: Not on file   Occupational History   ??? Not on file   Social Needs   ??? Financial resource strain: Not on file   ??? Food insecurity     Worry: Sometimes true     Inability: Sometimes true   ??? Transportation needs Medical: Yes     Non-medical: Yes   Tobacco Use   ??? Smoking status: Former Smoker     Packs/day: 0.50     Years: 30.00     Pack years: 15.00     Types: Cigarettes   ??? Smokeless tobacco: Never Used   ??? Tobacco comment: started smoking age as a teen   Substance and Sexual Activity   ??? Alcohol use: No   ??? Drug use: No   ??? Sexual activity: Not on file   Lifestyle   ??? Physical activity     Days per week: Not on file     Minutes per session: Not on file   ??? Stress: Not on file   Relationships   ??? Social Wellsite geologist on phone: Not on file     Gets together: Not on file     Attends religious service: Not on file     Active member of club or organization: Not on file     Attends meetings of clubs or organizations: Not on file     Relationship status: Not on file   Other Topics Concern   ??? Exercise Not Asked   ??? Living Situation Not Asked   ??? Do you use sunscreen? No   ??? Tanning bed use? No   ??? Are you easily burned? No   ??? Excessive sun exposure? No   ??? Blistering sunburns? No   Social History Narrative   ??? Not on file       ROS:   Otherwise, 12 point review of systems was completed and is negative except as per HPI.      Vitals:   Vitals:    03/29/19 1305   BP: 122/80   Pulse: 86   Resp: 18   Temp: 37.1 ??C (98.8 ??F)   SpO2:  93%     ?  Physical Exam:   Gen: AA in NAD  HEENT: NCAT, EOMI, PERRL, Non icteric sclerae, MMM  CV: RRR  RESP: quiet respirations on room air  EXT: warm and well perfused  SKIN: right skin gluteal excision clean.  No active drainage.  Wide granulation base, with start of neoepithelialization.      ?  Impression and Plan:  Haset Oaxaca is here today as a post op check after HS excision on 8/26.  -May transition to xeroform now that area has granulation tissue; will arrange home health.  Spoke about dressings and this being once a day.    -RTC 2-4 weeks      No follow-ups on file.

## 2019-04-06 MED FILL — HUMIRA PEN CITRATE FREE 40 MG/0.4 ML: 28 days supply | Qty: 8 | Fill #0 | Status: AC

## 2019-04-12 ENCOUNTER — Ambulatory Visit: Admit: 2019-04-12 | Discharge: 2019-04-13 | Payer: MEDICAID

## 2019-04-12 DIAGNOSIS — L732 Hidradenitis suppurativa: Secondary | ICD-10-CM

## 2019-04-12 MED ORDER — OXYCODONE 5 MG TABLET
ORAL_TABLET | ORAL | 0 refills | 3.00000 days | Status: CP | PRN
Start: 2019-04-12 — End: ?

## 2019-04-14 NOTE — Unmapped (Signed)
CC:     HPI:  Elizabeth Browning is a 53 y.o. female who is here today as a post op check after HS excision of right buttock.  She has been doing daily dressing changes.  She has been tolerating these well.  Pain is well controlled.  Pathology confirmed consistency with HS.      Overall no issues, but wants to know when a second surgery would be able to be performed.  She is following restrictions and feels the area is healing well.      Past Medical History:  Past Medical History:   Diagnosis Date   ??? Anesthesia to pain     difficulty relaxing and letting anesthesia work- anxiety noted   ??? Anxiety    ??? Arthritis    ??? Asthma    ??? Cancer (CMS-HCC)     cervical   ??? Chronic bronchitis (CMS-HCC)    ??? Chronic hepatitis C without hepatic coma (CMS-HCC)     cleared as of 10/25/16 per patient from [facility]   ??? Colon polyp    ??? Difficult intravenous access     requesting site to be numbed   ??? DOE (dyspnea on exertion)    ??? Dysthymia (RAF-HCC)    ??? Fatigue    ??? Foot pain    ??? GERD (gastroesophageal reflux disease) (RAF-HCC)    ??? Hepatitis C    ??? Loosening of knee joint prosthesis (CMS-HCC)     left   ??? Migraine    ??? MRSA (methicillin resistant staph aureus) culture positive     no + culture found- pt denies-current culture negative   ??? Neuropathic pain    ??? URI (upper respiratory infection)    ??? Vitamin D deficiency        Past Surgical History:  Past Surgical History:   Procedure Laterality Date   ??? BUNIONECTOMY Bilateral    ??? CARPOMETACARPEL SUSPENSION PLASTY Bilateral     tendon repair   ??? HAND SURGERY Bilateral     trigger finger   ??? HYSTERECTOMY      partial   ??? JOINT REPLACEMENT Left     knee   ??? KNEE SURGERY Left     revision 8 months later post TKR   ??? MOUTH SURGERY      all teeth removed   ??? PR EXC SWEAT GLAND LESN PERINEAL,SIMPL Right 03/09/2019    Procedure: R21 - EXCISION SKIN/SUBCUTANEOUS TISSUE FOR HIDRADENITIS, PERIANAL, PERINEAL, UMBILICAL; SIMPLE/INTERMEDIATE REPR;  Surgeon: Arsenio Katz, MD; Location: MAIN OR [facility];  Service: Plastics   ??? PR INCIS/DRAIN THIGH/KNEE ABSCESS,DEEP Left 11/11/2016    Procedure: I&D DEEP ABSCESS INFEC BURSA/HEMATOMA THIGH/KNEE;  Surgeon: Lowell Bouton, MD;  Location: OR [facility];  Service: Orthopedics   ??? PR REVISE KNEE JOINT REPLACE,ALL PARTS Left 11/04/2016    Procedure: REVISION LEFT TOTAL KNEE REPLACEMENT, FEMORAL AND TIBIAL COMPARTMENT;  Surgeon: Lowell Bouton, MD;  Location: OR [facility];  Service: Orthopedics   ??? TOE AMPUTATION Right     pinky    ??? TONSSILLECTOMY     ??? TUBAL LIGATION     ??? UMBILICAL HERNIA REPAIR      age 54       Medications:  Current Outpatient Medications   Medication Sig Dispense Refill   ??? ADALIMUMAB PEN CITRATE FREE 40 MG/0.4 ML Inject the contents of 2 pens (80mg ) under the skin every 7 days as directed by prescriber 8 each 11   ??? DEXILANT 60  mg capsule TK 1 C PO QAM FOR HEARTBURN AND ABDOMINAL PAIN     ??? empty container Misc use as directed 1 each 2   ??? traZODone (DESYREL) 50 MG tablet Take 50-100 mg by mouth nightly.     ??? oxyCODONE (ROXICODONE) 5 MG immediate release tablet Take 1 tablet (5 mg total) by mouth every four (4) hours as needed for pain. 15 tablet 0     No current facility-administered medications for this visit.        Allergies:  Allergies   Allergen Reactions   ??? Codeine Rash, Hives and Itching   ??? Buprenorphine Hcl      Unknown-pt denies   ??? Adhesive Rash   ??? Iodinated Contrast Media Rash   ??? Latex Rash   ??? Morphine Itching   ??? Opioids - Morphine Analogues Itching     Unknown-pt denies         Family History:   Family History   Problem Relation Age of Onset   ??? Hypertension Mother    ??? Diabetes Mother    ??? Hypertension Father    ??? Cancer Father         Leukemia   ??? Cancer Sister         breast    The patient reports no family history for bleeding disorders or anesthetic problems.    Social History:  Social History     Socioeconomic History   ??? Marital status: Single     Spouse name: Not on file   ??? Number of children: Not on file   ??? Years of education: Not on file   ??? Highest education level: Not on file   Occupational History   ??? Not on file   Social Needs   ??? Financial resource strain: Not on file   ??? Food insecurity     Worry: Sometimes true     Inability: Sometimes true   ??? Transportation needs     Medical: Yes     Non-medical: Yes   Tobacco Use   ??? Smoking status: Former Smoker     Packs/day: 0.50     Years: 30.00     Pack years: 15.00     Types: Cigarettes   ??? Smokeless tobacco: Never Used   ??? Tobacco comment: started smoking age as a teen   Substance and Sexual Activity   ??? Alcohol use: No   ??? Drug use: No   ??? Sexual activity: Not on file   Lifestyle   ??? Physical activity     Days per week: Not on file     Minutes per session: Not on file   ??? Stress: Not on file   Relationships   ??? Social Wellsite geologist on phone: Not on file     Gets together: Not on file     Attends religious service: Not on file     Active member of club or organization: Not on file     Attends meetings of clubs or organizations: Not on file     Relationship status: Not on file   Other Topics Concern   ??? Exercise Not Asked   ??? Living Situation Not Asked   ??? Do you use sunscreen? No   ??? Tanning bed use? No   ??? Are you easily burned? No   ??? Excessive sun exposure? No   ??? Blistering sunburns? No   Social History Narrative   ??? Not on  file       ROS:   Otherwise, 12 point review of systems was completed and is negative except as per HPI.      Vitals:   Vitals:    04/12/19 1116   BP: 136/82   Pulse: 78   Resp: 18   Temp: 36.8 ??C (98.2 ??F)   SpO2: 92%     ?  Physical Exam:   Gen: AA in NAD  HEENT: NCAT, EOMI, PERRL, Non icteric sclerae, MMM  CV: RRR  RESP: quiet respirations on room air  EXT: warm and well perfused  SKIN: right skin gluteal excision clean.  No active drainage.  Wide granulation base, with start of neoepithelialization (at least another 1 cm since last visit)    ?  Impression and Plan:  Elizabeth Browning is here today as a post op check after HS excision on 8/26.  -May transition to xeroform now that area has granulation tissue; will arrange home health.  Spoke about dressings and this being once a day.    -RTC 4 weeks  -Discussed no additional surgery until after fully healed  -Discussed one additional pain medication refill but then only tylenol and ibuprofen use        No follow-ups on file.

## 2019-04-28 NOTE — Unmapped (Signed)
[facility] Refill Coordination Note    Specialty Medication(s) to be Shipped:   Inflammatory Disorders: Humira    Other medication(s) to be shipped: none     Elizabeth Browning, DOB: 10-Jul-1966  Phone: [phone] (home)       All above HIPAA information was verified with patient.     Completed refill call assessment today to schedule patient's medication shipment from the [facility] [phone]).       Specialty medication(s) and dose(s) confirmed: Regimen is correct and unchanged.   Changes to medications: Cecilie reports no changes at this time.  Changes to insurance: No  Questions for the pharmacist: No    Confirmed patient received Welcome Packet with first shipment. The patient will receive a drug information handout for each medication shipped and additional FDA Medication Guides as required.       DISEASE/MEDICATION-SPECIFIC INFORMATION        N/A    SPECIALTY MEDICATION ADHERENCE     Medication Adherence    Patient reported X missed doses in the last month: 0  Specialty Medication: Humira CF 40 mg/0.4 ml  Patient is on additional specialty medications: No          Humira: 1 dose on hand      SHIPPING     Shipping address confirmed in Epic.     Delivery Scheduled: Yes, Expected medication delivery date: 05/04/19.     Medication will be delivered via UPS to the home address in Epic WAM.    [place] A Brandace Cargle   [facility] Shared [facility] Specialty [place]

## 2019-05-03 MED FILL — HUMIRA PEN CITRATE FREE 40 MG/0.4 ML: 28 days supply | Qty: 8 | Fill #1

## 2019-05-03 MED FILL — HUMIRA PEN CITRATE FREE 40 MG/0.4 ML: 28 days supply | Qty: 8 | Fill #1 | Status: AC

## 2019-05-17 ENCOUNTER — Ambulatory Visit: Admit: 2019-05-17 | Discharge: 2019-05-18 | Payer: MEDICAID

## 2019-05-20 NOTE — Unmapped (Signed)
CC:     HPI:  Jourden Delmont Hietpas??is a 53 y.o.??female??who is here today as a post op check after HS excision of right buttock. ??She has been doing daily dressing changes. ??She has been tolerating these well. ??Pain is well controlled. ??Pathology confirmed consistency with HS. ??    She has overall been doing well, but did note that she required hospitalization for a shingles outbreak.  She has recovered from this without incident.  Pain well controlled with dressing changes.        Past Medical History:  Past Medical History:   Diagnosis Date   ??? Anesthesia to pain     difficulty relaxing and letting anesthesia work- anxiety noted   ??? Anxiety    ??? Arthritis    ??? Asthma    ??? Cancer (CMS-HCC)     cervical   ??? Chronic bronchitis (CMS-HCC)    ??? Chronic hepatitis C without hepatic coma (CMS-HCC)     cleared as of 10/25/16 per patient from [facility]   ??? Colon polyp    ??? Difficult intravenous access     requesting site to be numbed   ??? DOE (dyspnea on exertion)    ??? Dysthymia (RAF-HCC)    ??? Fatigue    ??? Foot pain    ??? GERD (gastroesophageal reflux disease) (RAF-HCC)    ??? Hepatitis C    ??? Loosening of knee joint prosthesis (CMS-HCC)     left   ??? Migraine    ??? MRSA (methicillin resistant staph aureus) culture positive     no + culture found- pt denies-current culture negative   ??? Neuropathic pain    ??? URI (upper respiratory infection)    ??? Vitamin D deficiency        Past Surgical History:  Past Surgical History:   Procedure Laterality Date   ??? BUNIONECTOMY Bilateral    ??? CARPOMETACARPEL SUSPENSION PLASTY Bilateral     tendon repair   ??? HAND SURGERY Bilateral     trigger finger   ??? HYSTERECTOMY      partial   ??? JOINT REPLACEMENT Left     knee   ??? KNEE SURGERY Left     revision 8 months later post TKR   ??? MOUTH SURGERY      all teeth removed   ??? PR EXC SWEAT GLAND LESN PERINEAL,SIMPL Right 03/09/2019 Procedure: R21 - EXCISION SKIN/SUBCUTANEOUS TISSUE FOR HIDRADENITIS, PERIANAL, PERINEAL, UMBILICAL; SIMPLE/INTERMEDIATE REPR;  Surgeon: Arsenio Katz, MD;  Location: MAIN OR [facility];  Service: Plastics   ??? PR INCIS/DRAIN THIGH/KNEE ABSCESS,DEEP Left 11/11/2016    Procedure: I&D DEEP ABSCESS INFEC BURSA/HEMATOMA THIGH/KNEE;  Surgeon: Lowell Bouton, MD;  Location: OR [facility];  Service: Orthopedics   ??? PR REVISE KNEE JOINT REPLACE,ALL PARTS Left 11/04/2016    Procedure: REVISION LEFT TOTAL KNEE REPLACEMENT, FEMORAL AND TIBIAL COMPARTMENT;  Surgeon: Lowell Bouton, MD;  Location: OR [facility];  Service: Orthopedics   ??? TOE AMPUTATION Right     pinky    ??? TONSSILLECTOMY     ??? TUBAL LIGATION     ??? UMBILICAL HERNIA REPAIR      age 34       Medications:  Current Outpatient Medications   Medication Sig Dispense Refill   ??? ADALIMUMAB PEN CITRATE FREE 40 MG/0.4 ML Inject the contents of 2 pens (80mg ) under the skin every 7 days as directed by prescriber 8 each 11   ??? DEXILANT 60 mg capsule TK 1 C  PO QAM FOR HEARTBURN AND ABDOMINAL PAIN     ??? empty container Misc use as directed 1 each 2   ??? oxyCODONE (ROXICODONE) 5 MG immediate release tablet Take 1 tablet (5 mg total) by mouth every four (4) hours as needed for pain. (Patient not taking: Reported on 05/17/2019) 15 tablet 0   ??? traZODone (DESYREL) 50 MG tablet Take 50-100 mg by mouth nightly.       No current facility-administered medications for this visit.        Allergies:  Allergies   Allergen Reactions   ??? Codeine Rash, Hives and Itching   ??? Buprenorphine Hcl      Unknown-pt denies   ??? Adhesive Rash   ??? Iodinated Contrast Media Rash   ??? Latex Rash   ??? Morphine Itching   ??? Opioids - Morphine Analogues Itching     Unknown-pt denies         Family History:   Family History   Problem Relation Age of Onset   ??? Hypertension Mother    ??? Diabetes Mother    ??? Hypertension Father    ??? Cancer Father         Leukemia   ??? Cancer Sister         breast The patient reports no family history for bleeding disorders or anesthetic problems.    Social History:  Social History     Socioeconomic History   ??? Marital status: Single     Spouse name: Not on file   ??? Number of children: Not on file   ??? Years of education: Not on file   ??? Highest education level: Not on file   Occupational History   ??? Not on file   Social Needs   ??? Financial resource strain: Not on file   ??? Food insecurity     Worry: Sometimes true     Inability: Sometimes true   ??? Transportation needs     Medical: Yes     Non-medical: Yes   Tobacco Use   ??? Smoking status: Former Smoker     Packs/day: 0.50     Years: 30.00     Pack years: 15.00     Types: Cigarettes   ??? Smokeless tobacco: Never Used   ??? Tobacco comment: started smoking age as a teen   Substance and Sexual Activity   ??? Alcohol use: No   ??? Drug use: No   ??? Sexual activity: Not on file   Lifestyle   ??? Physical activity     Days per week: Not on file     Minutes per session: Not on file   ??? Stress: Not on file   Relationships   ??? Social Wellsite geologist on phone: Not on file     Gets together: Not on file     Attends religious service: Not on file     Active member of club or organization: Not on file     Attends meetings of clubs or organizations: Not on file     Relationship status: Not on file   Other Topics Concern   ??? Exercise Not Asked   ??? Living Situation Not Asked   ??? Do you use sunscreen? No   ??? Tanning bed use? No   ??? Are you easily burned? No   ??? Excessive sun exposure? No   ??? Blistering sunburns? No   Social History Narrative   ??? Not on file  ROS:   Otherwise, 12 point review of systems was completed and is negative except as per HPI.      Vitals:   Vitals:    05/17/19 1237   BP: 125/83   Pulse: 95   SpO2: 98%     ?  Physical Exam:   Gen: AA in NAD  HEENT: NCAT, EOMI, PERRL, Non icteric sclerae, MMM  CV: RRR  RESP: quiet respirations on room air  EXT: warm and well perfused SKIN: right skin gluteal excision clean. ??No active drainage. ??Wide granulation base, with start of neoepithelialization (at least another 1 cm since last visit)    ?  Impression and Plan: Evelynn Hench Sabine??is here today as a post op check after HS excision on 8/26.  -Continue xeroform now that area has granulation tissue daily  -RTC 4 weeks  -Discussed no additional surgery until after fully healed          No follow-ups on file.

## 2019-05-26 NOTE — Unmapped (Signed)
[facility] Refill Coordination Note    Specialty Medication(s) to be Shipped:   Inflammatory Disorders: Humira    Other medication(s) to be shipped: n/a     Elizabeth Browning, DOB: 08-02-1965  Phone: [phone] (home)       All above HIPAA information was verified with patient.     Completed refill call assessment today to schedule patient's medication shipment from the [facility] [phone]).       Specialty medication(s) and dose(s) confirmed: Regimen is correct and unchanged.   Changes to medications: Elizabeth Browning reports no changes at this time.  Changes to insurance: No  Questions for the pharmacist: No    Confirmed patient received Welcome Packet with first shipment. The patient will receive a drug information handout for each medication shipped and additional FDA Medication Guides as required.       DISEASE/MEDICATION-SPECIFIC INFORMATION        For patients on injectable medications: Patient currently has 1 doses left.  Next injection is scheduled for 05/28/2019.    SPECIALTY MEDICATION ADHERENCE     Medication Adherence    Patient reported X missed doses in the last month: 0  Specialty Medication: Humira CF 40 mg/0.4 ml  Patient is on additional specialty medications: No  Any gaps in refill history greater than 2 weeks in the last 3 months: no  Demonstrates understanding of importance of adherence: yes  Informant: patient  Reliability of informant: reliable  Confirmed plan for next specialty medication refill: delivery by pharmacy  Refills needed for supportive medications: not needed                Humira CF 40 mg/0.4 ml. 7 days on hand      SHIPPING     Shipping address confirmed in Epic.     Delivery Scheduled: Yes, Expected medication delivery date: 06/01/2019.     Medication will be delivered via UPS to the prescription address in Epic WAM.    Elizabeth Browning   [facility] Shared [facility] Specialty Technician

## 2019-05-31 MED FILL — HUMIRA PEN CITRATE FREE 40 MG/0.4 ML: 28 days supply | Qty: 8 | Fill #2

## 2019-05-31 MED FILL — HUMIRA PEN CITRATE FREE 40 MG/0.4 ML: 28 days supply | Qty: 8 | Fill #2 | Status: AC

## 2019-06-14 ENCOUNTER — Ambulatory Visit: Admit: 2019-06-14 | Discharge: 2019-06-15 | Payer: MEDICAID

## 2019-06-14 NOTE — Unmapped (Signed)
Plastic Surgery  Clinic Follow-Up Note      Patient Name: Elizabeth Browning  Patient Age: 53 y.o.  Encounter Date: 06/14/2019    Referring Physician: Jimmye Norman, PA  [address]  [facility]  [place],  [place] [address]  Primary Care Physician: Lilian Kapur, [place]        Assessment:  Elizabeth Browning is a 53 y.o. female s/p excision of R buttock HS on 03/09/19. She has healed well with local wound care and is neo-epithelializing well.        Recommendations/Plan:  - Silver nitrate applied to hypergranulation tissue  - Will continue local wound care with Xeroform, ABDs, and tape  - F/U 1 month        History of Present Illness:     Elizabeth Browning is a 53 y.o. female with hx HS to bilateral buttocks, Hurley 3, s/p excision of R buttock HS on 03/09/19. She did well after excision and has been highly compliant with her local wound care. Over the past month, she has had excellent epithelialization and now has extensive itching, as expected.    She is happy with her progress and would like to proceed with further resection once she is completely healed.        Physical Exam  Neuro: alert and oriented, pain well controlled  Pulmonary: Normal respiratory effort.   Cardiovascular: Regular rate.   Extr: Warm/well perfused, no edema  Buttock: R buttock s/p excision with excellent wound contracture and some hypergranulation tissue. Wide rim of neoepithelialization with some dryness. L buttock with sequelae of Hurley 3 HS.

## 2019-06-22 NOTE — Unmapped (Signed)
[facility] Specialty Pharmacy Refill Coordination Note    Specialty Medication(s) to be Shipped:   Inflammatory Disorders: Humira    Other medication(s) to be shipped: N/A     Elizabeth Browning, DOB: [birth year]  Phone: [phone] (home)       All above HIPAA information was verified with patient.     Was a Nurse, learning disability used for this call? No    Completed refill call assessment today to schedule patient's medication shipment from the [facility] [phone]).       Specialty medication(s) and dose(s) confirmed: Regimen is correct and unchanged.   Changes to medications: Zeya reports no changes at this time.  Changes to insurance: No  Questions for the pharmacist: No    Confirmed patient received Welcome Packet with first shipment. The patient will receive a drug information handout for each medication shipped and additional FDA Medication Guides as required.       DISEASE/MEDICATION-SPECIFIC INFORMATION        For patients on injectable medications: Patient currently has 1 doses left.  Next injection is scheduled for 06/25/19.    SPECIALTY MEDICATION ADHERENCE     Medication Adherence    Patient reported X missed doses in the last month: 0  Specialty Medication: Humira (CF) Pen 40mg /0.24mL  Patient is on additional specialty medications: No  Informant: patient                Humira (CF) Pen 40mg /0.32mL: 1 days of medicine on hand         SHIPPING     Shipping address confirmed in Epic.     Delivery Scheduled: Yes, Expected medication delivery date: 06/25/19.     Medication will be delivered via UPS to the prescription address in Epic [place].    Wyatt Mage M Elisabeth Cara   [facility] Specialty Technician

## 2019-06-24 MED FILL — HUMIRA PEN CITRATE FREE 40 MG/0.4 ML: 28 days supply | Qty: 8 | Fill #3 | Status: AC

## 2019-06-24 MED FILL — HUMIRA PEN CITRATE FREE 40 MG/0.4 ML: 28 days supply | Qty: 8 | Fill #3

## 2019-07-19 ENCOUNTER — Ambulatory Visit: Admit: 2019-07-19 | Discharge: 2019-07-20 | Payer: MEDICAID

## 2019-07-19 NOTE — Unmapped (Signed)
Plastic Surgery  Clinic Follow-Up Note      Patient Name: Elizabeth Browning  Patient Age: 54 y.o.  Encounter Date: 07/19/2019    Referring Physician: Jimmye Norman, PA  [address]  [facility]  [place],  [place] [address]  Primary Care Physician: Lilian Kapur, [place]        Assessment:  Elizabeth Browning is a 54 y.o. female s/p excision of R buttock HS on 03/09/19. She has healed well with local wound care and is neo-epithelializing well.    We discussed that she continues to heal well.  Once she has fully healed we can discuss excision of the other buttock.    Recommendations/Plan:  -Silver nitrate reapplied to hypergranulation tissue  -Continue Vaseline dressings  -Return to clinic in 4 weeks      History of Present Illness:     Elizabeth Browning is a 54 y.o. female with hx HS to bilateral buttocks, Hurley 3, s/p excision of R buttock HS on 03/09/19. She did well after excision and has been highly compliant with her local wound care.  She was last seen in clinic on 11/30 at which time she had silver nitrate applied to an area of hypergranulation tissue.     Today she reports, she has been doing well.  She denies fevers, chills, or change in drainage from the wound.  She has been compliant with her Vaseline dressings.  She feels at the wound is progressing and is excited to finish healing and talk about excision of the other side.      Physical Exam  Neuro: alert and oriented, pain well controlled  Pulmonary: Normal respiratory effort.   Cardiovascular: Regular rate.   Extr: Warm/well perfused, no edema  Buttock: R buttock s/p excision with healthy appearing wound.  There is significant amount of neoepithelialization from the edges of the wound.  Residual wound with hyper granulation tissue

## 2019-07-20 NOTE — Unmapped (Signed)
[facility] Specialty Pharmacy Refill Coordination Note    Specialty Medication(s) to be Shipped:   Inflammatory Disorders: Humira    Other medication(s) to be shipped: n/a     Elizabeth Browning, DOB: [birth year]  Phone: [phone] (home)       All above HIPAA information was verified with patient.     Was a Nurse, learning disability used for this call? No    Completed refill call assessment today to schedule patient's medication shipment from the [facility] [phone]).       Specialty medication(s) and dose(s) confirmed: Regimen is correct and unchanged.   Changes to medications: Elizabeth Browning reports no changes at this time.  Changes to insurance: No  Questions for the pharmacist: No    Confirmed patient received Welcome Packet with first shipment. The patient will receive a drug information handout for each medication shipped and additional FDA Medication Guides as required.       DISEASE/MEDICATION-SPECIFIC INFORMATION        For patients on injectable medications: Patient currently has 1 doses left.  Next injection is scheduled for 07/23/2019.    SPECIALTY MEDICATION ADHERENCE     Medication Adherence    Patient reported X missed doses in the last month: 0  Specialty Medication: Humira CF 40 mg/0.4 ml  Patient is on additional specialty medications: No  Any gaps in refill history greater than 2 weeks in the last 3 months: no  Demonstrates understanding of importance of adherence: yes  Informant: patient  Reliability of informant: reliable  Confirmed plan for next specialty medication refill: delivery by pharmacy  Refills needed for supportive medications: not needed                Humira CF 40 mg/0.4 ml. 7 days on hand       SHIPPING     Shipping address confirmed in Epic.     Delivery Scheduled: Yes, Expected medication delivery date: 07/22/2019.     Medication will be delivered via UPS to the prescription address in Epic [place].    Elizabeth Browning Elizabeth Browning [facility] Specialty Technician

## 2019-07-21 MED FILL — HUMIRA PEN CITRATE FREE 40 MG/0.4 ML: 28 days supply | Qty: 8 | Fill #4 | Status: AC

## 2019-07-21 MED FILL — HUMIRA PEN CITRATE FREE 40 MG/0.4 ML: 28 days supply | Qty: 8 | Fill #4

## 2019-08-13 NOTE — Unmapped (Signed)
[facility] Specialty Pharmacy Refill Coordination Note    Specialty Medication(s) to be Shipped:   Inflammatory Disorders: Humira    Other medication(s) to be shipped: n/a     Elizabeth Browning, DOB: [birth year]  Phone: [phone] (home)       All above HIPAA information was verified with patient.     Was a Nurse, learning disability used for this call? No    Completed refill call assessment today to schedule patient's medication shipment from the [facility] [phone]).       Specialty medication(s) and dose(s) confirmed: Regimen is correct and unchanged.   Changes to medications: [name] reports no changes at this time.  Changes to insurance: No  Questions for the pharmacist: No    Confirmed patient received Welcome Packet with first shipment. The patient will receive a drug information handout for each medication shipped and additional FDA Medication Guides as required.       DISEASE/MEDICATION-SPECIFIC INFORMATION        For patients on injectable medications: Patient currently has 2 doses left.  Next injection is scheduled for 08/12/2019.    SPECIALTY MEDICATION ADHERENCE     Medication Adherence    Patient reported X missed doses in the last month: 0  Specialty Medication: Humira CF 40 mg/0.4 ml   Patient is on additional specialty medications: No  Any gaps in refill history greater than 2 weeks in the last 3 months: no  Demonstrates understanding of importance of adherence: yes  Informant: patient  Reliability of informant: reliable  Confirmed plan for next specialty medication refill: delivery by pharmacy  Refills needed for supportive medications: not needed                Humira CF 40 mg/0.4 ml . 14 days on ahnd      SHIPPING     Shipping address confirmed in Epic.     Delivery Scheduled: Yes, Expected medication delivery date: 08/19/2019.     Medication will be delivered via UPS to the prescription address in Epic WAM.    [name] D Nestor Wieneke   [facility] Specialty Technician

## 2019-08-16 ENCOUNTER — Ambulatory Visit: Admit: 2019-08-16 | Discharge: 2019-08-17 | Payer: MEDICAID

## 2019-08-16 DIAGNOSIS — L732 Hidradenitis suppurativa: Principal | ICD-10-CM

## 2019-08-16 NOTE — Unmapped (Signed)
[facility] Plastic Surgery Contact Phone Numbers    APPOINTMENTS / QUESTIONS    [facility]   [address] [place], [place] [address]  T: [phone]  F: [phone]  Website:  [facility]     Ambulatory Surgery Center  1st Floor, Ambulatory Care Center  [address]  [place], [place] [address]  T: [phone]  Website:  [facility]    [facility]  [address] [place], [place] [address]  Ground Floor [facility]  T: [phone]  F: [phone]    [facility] Services at [facility]  [address] [place], [place] [address]  T: [phone]  F: [phone]    [facility]  [address] [place], [place] [address]   1st Floor [facility]  T: [phone]  F: [phone]    [facility]  [address], [place], [place] [address]  Third Floor, [address]  T: [phone]   F: [phone]    Administrative Office  [address]  [place], [place] [address]  T:  [phone]  F:  [phone]      Physicians:  Jackquline Bosch MD:   Nurse:  Sarita Bottom BSN, RN [phone] [email or web address]  Surgery Scheduler: Lysle Morales  [phone] [email or web address]     [name] MD:   Nurse:  Elisabeth Pigeon BSN, RN, CPSN [phone] [email or web address]   Nurse:  Sarita Bottom BSN, RN [phone] [email or web address]  Surgery Scheduler: Lysle Morales  [phone] [email or web address]    Tarry Kos MD:   Nurse:  Elisabeth Pigeon BSN, RN, CPSN [phone] [email or web address]  Surgery Scheduler: Claris Che  [phone] [email or web address]    Marylu Lund, MD:   Nurse:  Venetia Maxon BSN, RN, CPSN  [phone] [email or web address]  Medical Assistant: Waymon Amato, RMA  [phone] [email or web address]  Surgery Scheduler: Lysle Morales  [phone] [email or web address]     Adeyemi Clydie Braun MD:  Nurse: Mila Palmer BSN, RN, [facility]  [phone] [email or web address]  Surgery Scheduler: Kristine Royal [phone] [email or web address]    Darleene Cleaver MD:  Nurse: Venetia Maxon BSN, RN, CPSN  [phone]  [email or web address]  Surgery Scheduler: Kristine Royal [phone] [email or web address]    Nurse Practitioner:  Ezekiel Slocumb, DNP, NP-C:   Medical Assistant: Waymon Amato, RMA  [phone] [email or web address]  Surgery Scheduler (ASC): Kristine Royal [phone] [email or web address]  Surgery Scheduler ([facility]): Claris Che  [phone] [email or web address]    Surgery Scheduling:  You will receive a phone call from the surgery schedulers regarding date for surgery in 1-2 weeks from  your appointment.    Please call one of the above numbers if you have not received a phone call 2 weeks after your appointment with the provider.      AFTER HOURS/HOLIDAYS:  For emergencies after-hours (after 5pm, or weekends): call the [facility] operator [phone]) and ask to page the Plastic Surgery resident on call. You will be directed to a surgery resident who likely is not immediately aware of the details of your case, but can help you deal with any emergencies that cannot wait until regular business hours.  Please be aware that this person is responding to many in-hospital emergencies and patient issues and may not answer your phone call immediately, but will return your call as soon as possible.    Financial Counselor:   T: [phone]  F: [phone]    Precare Location:  [facility] Pre-Procedure Services at [facility]   [address]  [place], [place] [address] [facility] ??? old [address] near [employer])    Precare: The day prior to your scheduled surgery, Pre-Care will call you with instructions.  If you have not heard from them by 4PM, and would like to check on the status of your surgery, please call:  [facility]: [phone]  [facility]: [phone]  [facility]: [phone]  [place]:  [phone]    Weather Hotline:  [phone]    [facility] Contact Information: Please call to schedule  *Radiology (CT, X-ray, Ultrasound) [phone]  *Mammography & Breast Ultrasound [phone]  *MRI [phone]  *Interventional Radiology [phone]    FLMA forms and paperwork:  There is a $25 fee for each form that needs completed.  Please allow  a 2 week turn around for forms to be completed and faxed.

## 2019-08-16 NOTE — Unmapped (Signed)
CC:     HPI:  Elizabeth Browning is a 54 y.o. female with hx HS to bilateral buttocks, Hurley 3, s/p excision of R buttock HS on 03/09/19. She did well after excision and has been highly compliant with her local wound care. She was last seen one month ago, and has been healing well.  Will plan for excision of the left buttock HS.        Past Medical History:  Past Medical History:   Diagnosis Date   ??? Anesthesia to pain     difficulty relaxing and letting anesthesia work- anxiety noted   ??? Anxiety    ??? Arthritis    ??? Asthma    ??? Cancer (CMS-HCC)     cervical   ??? Chronic bronchitis (CMS-HCC)    ??? Chronic hepatitis C without hepatic coma (CMS-HCC)     cleared as of 10/25/16 per patient from [facility]   ??? Colon polyp    ??? Difficult intravenous access     requesting site to be numbed   ??? DOE (dyspnea on exertion)    ??? Dysthymia (RAF-HCC)    ??? Fatigue    ??? Foot pain    ??? GERD (gastroesophageal reflux disease) (RAF-HCC)    ??? Hepatitis C    ??? Loosening of knee joint prosthesis (CMS-HCC)     left   ??? Migraine    ??? MRSA (methicillin resistant staph aureus) culture positive     no + culture found- pt denies-current culture negative   ??? Neuropathic pain    ??? URI (upper respiratory infection)    ??? Vitamin D deficiency        Past Surgical History:  Past Surgical History:   Procedure Laterality Date   ??? BUNIONECTOMY Bilateral    ??? CARPOMETACARPEL SUSPENSION PLASTY Bilateral     tendon repair   ??? HAND SURGERY Bilateral     trigger finger   ??? HYSTERECTOMY      partial   ??? JOINT REPLACEMENT Left     knee   ??? KNEE SURGERY Left     revision 8 months later post TKR   ??? MOUTH SURGERY      all teeth removed   ??? PR EXC SWEAT GLAND LESN PERINEAL,SIMPL Right 03/09/2019    Procedure: R21 - EXCISION SKIN/SUBCUTANEOUS TISSUE FOR HIDRADENITIS, PERIANAL, PERINEAL, UMBILICAL; SIMPLE/INTERMEDIATE REPR;  Surgeon: Arsenio Katz, MD;  Location: MAIN OR [facility];  Service: Plastics   ??? PR INCIS/DRAIN THIGH/KNEE ABSCESS,DEEP Left 11/11/2016 Procedure: I&D DEEP ABSCESS INFEC BURSA/HEMATOMA THIGH/KNEE;  Surgeon: Lowell Bouton, MD;  Location: OR [facility];  Service: Orthopedics   ??? PR REVISE KNEE JOINT REPLACE,ALL PARTS Left 11/04/2016    Procedure: REVISION LEFT TOTAL KNEE REPLACEMENT, FEMORAL AND TIBIAL COMPARTMENT;  Surgeon: Lowell Bouton, MD;  Location: OR [facility];  Service: Orthopedics   ??? TOE AMPUTATION Right     pinky    ??? TONSSILLECTOMY     ??? TUBAL LIGATION     ??? UMBILICAL HERNIA REPAIR      age 11       Medications:  Current Outpatient Medications   Medication Sig Dispense Refill   ??? ADALIMUMAB PEN CITRATE FREE 40 MG/0.4 ML Inject the contents of 2 pens (80mg ) under the skin every 7 days as directed by prescriber 8 each 11   ??? ADVAIR DISKUS 250-50 mcg/dose diskus INHALE 1 PUFF BY MOUTH TWICE DAILY 12 HOURS APART     ??? albuterol HFA 90 mcg/actuation  inhaler Inhale 2 puffs every four (4) hours as needed.     ??? DEXILANT 60 mg capsule TK 1 C PO QAM FOR HEARTBURN AND ABDOMINAL PAIN     ??? empty container Misc use as directed 1 each 2   ??? ibuprofen (MOTRIN) 800 MG tablet Take 800 mg by mouth every six (6) hours as needed.     ??? ipratropium-albuteroL (DUO-NEB) 0.5-2.5 mg/3 mL nebulizer USE 1 VIAL VIA NEBULIZER EVERY 4 TO 6 HOURS AS NEEDED FOR COUGH OR WHEEZING     ??? oxyCODONE (ROXICODONE) 5 MG immediate release tablet Take 1 tablet (5 mg total) by mouth every four (4) hours as needed for pain. (Patient not taking: Reported on 05/17/2019) 15 tablet 0   ??? traZODone (DESYREL) 50 MG tablet Take 50-100 mg by mouth nightly.       No current facility-administered medications for this visit.        Allergies:  Allergies   Allergen Reactions   ??? Codeine Rash, Hives and Itching   ??? Buprenorphine Hcl      Unknown-pt denies   ??? Adhesive Rash   ??? Iodinated Contrast Media Rash   ??? Latex Rash   ??? Morphine Itching   ??? Opioids - Morphine Analogues Itching     Unknown-pt denies         Family History:   Family History   Problem Relation Age of Onset   ??? Hypertension Mother    ??? Diabetes Mother    ??? Hypertension Father    ??? Cancer Father         Leukemia   ??? Cancer Sister         breast    The patient reports no family history for bleeding disorders or anesthetic problems.    Social History:  Social History     Socioeconomic History   ??? Marital status: Single     Spouse name: Not on file   ??? Number of children: Not on file   ??? Years of education: Not on file   ??? Highest education level: Not on file   Occupational History   ??? Not on file   Social Needs   ??? Financial resource strain: Not on file   ??? Food insecurity     Worry: Sometimes true     Inability: Sometimes true   ??? Transportation needs     Medical: Yes     Non-medical: Yes   Tobacco Use   ??? Smoking status: Former Smoker     Packs/day: 0.50     Years: 30.00     Pack years: 15.00     Types: Cigarettes   ??? Smokeless tobacco: Never Used   ??? Tobacco comment: started smoking age as a teen   Substance and Sexual Activity   ??? Alcohol use: No   ??? Drug use: No   ??? Sexual activity: Not on file   Lifestyle   ??? Physical activity     Days per week: Not on file     Minutes per session: Not on file   ??? Stress: Not on file   Relationships   ??? Social Wellsite geologist on phone: Not on file     Gets together: Not on file     Attends religious service: Not on file     Active member of club or organization: Not on file     Attends meetings of clubs or organizations: Not on file  Relationship status: Not on file   Other Topics Concern   ??? Exercise Not Asked   ??? Living Situation Not Asked   ??? Do you use sunscreen? No   ??? Tanning bed use? No   ??? Are you easily burned? No   ??? Excessive sun exposure? No   ??? Blistering sunburns? No   Social History Narrative   ??? Not on file       ROS:   Otherwise, 12 point review of systems was completed and is negative except as per HPI.      Vitals:   Vitals:    08/16/19 0954   BP: 127/84   Pulse: 74   Temp: 36.2 ??C (97.1 ??F)   SpO2: 98%     ?  Physical Exam:   Neuro: alert and oriented, pain well controlled  Pulmonary: Normal respiratory effort.   Cardiovascular: Regular rate.   Extr: Warm/well perfused, no edema  Buttock: R buttock s/p excision with healthy appearing wound.  There is significant amount of neoepithelialization from the edges of the wound.  Residual wound with hyper granulation tissue    ?  Impression and Plan:  Elizabeth Browning is a 54 y.o. female s/p excision of R buttock HS on 03/09/19. She has healed well with local wound care and is neo-epithelializing well.  -Can plan for excision on the left buttock HS  -Continue dressing right buttock        No follow-ups on file.

## 2019-08-18 MED FILL — HUMIRA PEN CITRATE FREE 40 MG/0.4 ML: 28 days supply | Qty: 8 | Fill #5

## 2019-08-18 MED FILL — HUMIRA PEN CITRATE FREE 40 MG/0.4 ML: 28 days supply | Qty: 8 | Fill #5 | Status: AC

## 2019-09-09 NOTE — Unmapped (Signed)
[facility] Clinical Assessment & Refill Coordination Note    Nuala Chiles, DOB: 1966-02-19  Phone: [phone] (home)     All above HIPAA information was verified with patient.     Was a Nurse, learning disability used for this call? No    Specialty Medication(s):   Inflammatory Disorders: Humira     Current Outpatient Medications   Medication Sig Dispense Refill   ??? ADALIMUMAB PEN CITRATE FREE 40 MG/0.4 ML Inject the contents of 2 pens (80mg ) under the skin every 7 days as directed by prescriber 8 each 11   ??? ADVAIR DISKUS 250-50 mcg/dose diskus INHALE 1 PUFF BY MOUTH TWICE DAILY 12 HOURS APART     ??? albuterol HFA 90 mcg/actuation inhaler Inhale 2 puffs every four (4) hours as needed.     ??? DEXILANT 60 mg capsule TK 1 C PO QAM FOR HEARTBURN AND ABDOMINAL PAIN     ??? empty container Misc use as directed 1 each 2   ??? ibuprofen (MOTRIN) 800 MG tablet Take 800 mg by mouth every six (6) hours as needed.     ??? ipratropium-albuteroL (DUO-NEB) 0.5-2.5 mg/3 mL nebulizer USE 1 VIAL VIA NEBULIZER EVERY 4 TO 6 HOURS AS NEEDED FOR COUGH OR WHEEZING     ??? oxyCODONE (ROXICODONE) 5 MG immediate release tablet Take 1 tablet (5 mg total) by mouth every four (4) hours as needed for pain. (Patient not taking: Reported on 05/17/2019) 15 tablet 0   ??? traZODone (DESYREL) 50 MG tablet Take 50-100 mg by mouth nightly.       No current facility-administered medications for this visit.         Changes to medications: Janasha reports no changes at this time.    Allergies   Allergen Reactions   ??? Codeine Rash, Hives and Itching   ??? Buprenorphine Hcl      Unknown-pt denies   ??? Adhesive Rash   ??? Iodinated Contrast Media Rash   ??? Latex Rash   ??? Morphine Itching   ??? Opioids - Morphine Analogues Itching     Unknown-pt denies         Changes to allergies: No    SPECIALTY MEDICATION ADHERENCE     Humira 40/0.4 mg/ml: 7 days of medicine on hand       Medication Adherence    Patient reported X missed doses in the last month: 0  Specialty Medication: Humira 40 mg/0.4 ml  Patient is on additional specialty medications: No  Informant: patient  Confirmed plan for next specialty medication refill: delivery by pharmacy  Refills needed for supportive medications: not needed          Specialty medication(s) dose(s) confirmed: Regimen is correct and unchanged.     Are there any concerns with adherence? No    Adherence counseling provided? Not needed    CLINICAL MANAGEMENT AND INTERVENTION      Clinical Benefit Assessment:    Do you feel the medicine is effective or helping your condition? Yes    Clinical Benefit counseling provided? Not needed    Adverse Effects Assessment:    Are you experiencing any side effects? No    Are you experiencing difficulty administering your medicine? No    Quality of Life Assessment:    How many days over the past month did your HS  keep you from your normal activities? For example, brushing your teeth or getting up in the morning. 0    Have you discussed  this with your provider? Not needed    Therapy Appropriateness:    Is therapy appropriate? Yes, therapy is appropriate and should be continued    DISEASE/MEDICATION-SPECIFIC INFORMATION      For patients on injectable medications: Patient currently has 1 doses left.  Next injection is scheduled for 09/10/19.    PATIENT SPECIFIC NEEDS     ? Does the patient have any physical, cognitive, or cultural barriers? No    ? Is the patient high risk? No     ? Does the patient require a Care Management Plan? No     ? Does the patient require physician intervention or other additional services (i.e. nutrition, smoking cessation, social work)? No      SHIPPING     Specialty Medication(s) to be Shipped:   Inflammatory Disorders: Humira    Other medication(s) to be shipped: n/a     Changes to insurance: No    Delivery Scheduled: Yes, Expected medication delivery date: 09/14/19.     Medication will be delivered via UPS to the confirmed prescription address in [facility]. The patient will receive a drug information handout for each medication shipped and additional FDA Medication Guides as required.  Verified that patient has previously received a Conservation officer, historic buildings.    All of the patient's questions and concerns have been addressed.    Swayze Kozuch Vangie Bicker   [facility] Specialty Pharmacist

## 2019-09-11 ENCOUNTER — Ambulatory Visit: Admit: 2019-09-11 | Discharge: 2019-09-12 | Payer: MEDICAID

## 2019-09-11 NOTE — Unmapped (Signed)
COVID Pre-Procedure Intake Form     Assessment     Elizabeth Browning is a 54 y.o. female presenting to [facility] for COVID testing.     Plan     If no testing performed, pt counseled on routine care for respiratory illness.  If testing performed, COVID sent.  Patient directed to Home given findings during today's visit.    Subjective     Elizabeth Browning is a 54 y.o. female who [facility] the [facility] with complaints of the following:    Exposure History: In the last 21 days?     Have you traveled outside of [place]? No               Have you been in close contact with someone confirmed by a test to have COVID? (Close contact is within 6 feet for at least 10 minutes) No       Have you worked in a health care facility? No     Lived or worked facility like a nursing home, group home, or assisted living?    No         Are you scheduled to have surgery or a procedure in the next 3 days? Yes               Are you scheduled to receive cancer chemotherapy within the next 7 days?    No     Have you ever been tested before for COVID-19 with a swab of your nose? No   Are you a healthcare worker being tested so to return to work No     Right now,  do you have any of the following that developed over the past 7 days (as stated by patient on intake form):    Subjective fever (felt feverish) No   Chills (especially repeated shaking chills) No   Severe fatigue (felt very tired) No   Muscle aches No   Runny nose No   Sore throat No   Loss of taste or smell No   Cough (new onset or worsening of chronic cough) No   Shortness of breath No   Nausea or vomiting No   Headache No   Abdominal Pain No   Diarrhea (3 or more loose stools in last 24 hours) No       Scribe's Attestation: Denver Faster, FNP obtained and performed the history, physical exam and medical decision making elements that were entered into the chart.  Signed by Alleen Borne serving as Scribe, on 09/11/2019 11:55 AM

## 2019-09-13 MED FILL — HUMIRA PEN CITRATE FREE 40 MG/0.4 ML: 28 days supply | Qty: 8 | Fill #6 | Status: AC

## 2019-09-13 MED FILL — HUMIRA PEN CITRATE FREE 40 MG/0.4 ML: 28 days supply | Qty: 8 | Fill #6

## 2019-09-13 NOTE — Unmapped (Unsigned)
CC:     HPI:  Elizabeth Browning is a 54 y.o. female with hx HS to bilateral buttocks, Hurley 3, s/p excision of R buttock HS on 03/09/19. She did well after excision and has been highly compliant with her local wound care. She was last seen one month ago, and has been healing well.  Will plan for excision of the left buttock HS.        Past Medical History:  Past Medical History:   Diagnosis Date   ??? Anesthesia to pain     difficulty relaxing and letting anesthesia work- anxiety noted   ??? Anxiety    ??? Arthritis    ??? Asthma    ??? Cancer (CMS-HCC)     cervical   ??? Chronic bronchitis (CMS-HCC)    ??? Chronic hepatitis C without hepatic coma (CMS-HCC)     cleared as of 10/25/16 per patient from [facility]   ??? Colon polyp    ??? Difficult intravenous access     requesting site to be numbed   ??? DOE (dyspnea on exertion)    ??? Dysthymia (RAF-HCC)    ??? Fatigue    ??? Foot pain    ??? GERD (gastroesophageal reflux disease) (RAF-HCC)    ??? Hepatitis C    ??? Loosening of knee joint prosthesis (CMS-HCC)     left   ??? Migraine    ??? MRSA (methicillin resistant staph aureus) culture positive     no + culture found- pt denies-current culture negative   ??? Neuropathic pain    ??? URI (upper respiratory infection)    ??? Vitamin D deficiency        Past Surgical History:  Past Surgical History:   Procedure Laterality Date   ??? BUNIONECTOMY Bilateral    ??? CARPOMETACARPEL SUSPENSION PLASTY Bilateral     tendon repair   ??? HAND SURGERY Bilateral     trigger finger   ??? HYSTERECTOMY      partial   ??? JOINT REPLACEMENT Left     knee   ??? KNEE SURGERY Left     revision 8 months later post TKR   ??? MOUTH SURGERY      all teeth removed   ??? PR EXC SWEAT GLAND LESN PERINEAL,SIMPL Right 03/09/2019    Procedure: R21 - EXCISION SKIN/SUBCUTANEOUS TISSUE FOR HIDRADENITIS, PERIANAL, PERINEAL, UMBILICAL; SIMPLE/INTERMEDIATE REPR;  Surgeon: Arsenio Katz, MD;  Location: MAIN OR [facility];  Service: Plastics   ??? PR INCIS/DRAIN THIGH/KNEE ABSCESS,DEEP Left 11/11/2016 Procedure: I&D DEEP ABSCESS INFEC BURSA/HEMATOMA THIGH/KNEE;  Surgeon: Lowell Bouton, MD;  Location: OR [facility];  Service: Orthopedics   ??? PR REVISE KNEE JOINT REPLACE,ALL PARTS Left 11/04/2016    Procedure: REVISION LEFT TOTAL KNEE REPLACEMENT, FEMORAL AND TIBIAL COMPARTMENT;  Surgeon: Lowell Bouton, MD;  Location: OR [facility];  Service: Orthopedics   ??? TOE AMPUTATION Right     pinky    ??? TONSSILLECTOMY     ??? TUBAL LIGATION     ??? UMBILICAL HERNIA REPAIR      age 76       Medications:  Current Outpatient Medications   Medication Sig Dispense Refill   ??? ADALIMUMAB PEN CITRATE FREE 40 MG/0.4 ML Inject the contents of 2 pens (80mg ) under the skin every 7 days as directed by prescriber 8 each 11   ??? ADVAIR DISKUS 250-50 mcg/dose diskus INHALE 1 PUFF BY MOUTH TWICE DAILY 12 HOURS APART     ??? albuterol HFA 90 mcg/actuation  inhaler Inhale 2 puffs every four (4) hours as needed.     ??? DEXILANT 60 mg capsule TK 1 C PO QAM FOR HEARTBURN AND ABDOMINAL PAIN     ??? empty container Misc use as directed 1 each 2   ??? ibuprofen (MOTRIN) 800 MG tablet Take 800 mg by mouth every six (6) hours as needed.     ??? ipratropium-albuteroL (DUO-NEB) 0.5-2.5 mg/3 mL nebulizer USE 1 VIAL VIA NEBULIZER EVERY 4 TO 6 HOURS AS NEEDED FOR COUGH OR WHEEZING     ??? oxyCODONE (ROXICODONE) 5 MG immediate release tablet Take 1 tablet (5 mg total) by mouth every four (4) hours as needed for pain. (Patient not taking: Reported on 05/17/2019) 15 tablet 0   ??? traZODone (DESYREL) 50 MG tablet Take 50-100 mg by mouth nightly.       No current facility-administered medications for this visit.        Allergies:  Allergies   Allergen Reactions   ??? Codeine Rash, Hives and Itching   ??? Buprenorphine Hcl      Unknown-pt denies   ??? Adhesive Rash   ??? Iodinated Contrast Media Rash   ??? Latex Rash   ??? Morphine Itching   ??? Opioids - Morphine Analogues Itching     Unknown-pt denies         Family History:   Family History   Problem Relation Age of Onset   ??? Hypertension Mother    ??? Diabetes Mother    ??? Hypertension Father    ??? Cancer Father         Leukemia   ??? Cancer Sister         breast    The patient reports no family history for bleeding disorders or anesthetic problems.    Social History:  Social History     Socioeconomic History   ??? Marital status: Single     Spouse name: Not on file   ??? Number of children: Not on file   ??? Years of education: Not on file   ??? Highest education level: Not on file   Occupational History   ??? Not on file   Social Needs   ??? Financial resource strain: Not on file   ??? Food insecurity     Worry: Sometimes true     Inability: Sometimes true   ??? Transportation needs     Medical: Yes     Non-medical: Yes   Tobacco Use   ??? Smoking status: Former Smoker     Packs/day: 0.50     Years: 30.00     Pack years: 15.00     Types: Cigarettes   ??? Smokeless tobacco: Never Used   ??? Tobacco comment: started smoking age as a teen   Substance and Sexual Activity   ??? Alcohol use: No   ??? Drug use: No   ??? Sexual activity: Not on file   Lifestyle   ??? Physical activity     Days per week: Not on file     Minutes per session: Not on file   ??? Stress: Not on file   Relationships   ??? Social Wellsite geologist on phone: Not on file     Gets together: Not on file     Attends religious service: Not on file     Active member of club or organization: Not on file     Attends meetings of clubs or organizations: Not on file  Relationship status: Not on file   Other Topics Concern   ??? Exercise Not Asked   ??? Living Situation Not Asked   ??? Do you use sunscreen? No   ??? Tanning bed use? No   ??? Are you easily burned? No   ??? Excessive sun exposure? No   ??? Blistering sunburns? No   Social History Narrative   ??? Not on file       ROS:   Otherwise, 12 point review of systems was completed and is negative except as per HPI.      Vitals:   There were no vitals filed for this visit.  ?  Physical Exam:   Neuro: alert and oriented, pain well controlled  Pulmonary: Normal respiratory effort. Cardiovascular: Regular rate.   Extr: Warm/well perfused, no edema  Buttock: R buttock s/p excision with healthy appearing wound.  There is significant amount of neoepithelialization from the edges of the wound.  Residual wound with hyper granulation tissue    ?  Impression and Plan:  Benigna Delisi is a 54 y.o. female s/p excision of R buttock HS on 03/09/19. She has healed well with local wound care and is neo-epithelializing well.  - plan for excision on the left buttock HS          No follow-ups on file.

## 2019-09-14 ENCOUNTER — Encounter
Admit: 2019-09-14 | Discharge: 2019-09-18 | Disposition: A | Discharge status: Home / Self Care | Payer: MEDICAID | Attending: Anesthesiology

## 2019-09-14 ENCOUNTER — Ambulatory Visit: Admit: 2019-09-14 | Discharge: 2019-09-18 | Disposition: A | Discharge status: Home / Self Care | Payer: MEDICAID

## 2019-09-14 NOTE — Unmapped (Signed)
Brief Operative Note  (CSN: [id number])      Date of Surgery: 09/14/2019    Pre-op Diagnosis: hidradenitis    Post-op Diagnosis: Hidradenitis [L73.2]    Procedure(s):  R24, EXCISION SKIN/SUBCUTANEOUS TISSUE FOR HIDRADENITIS, PERIANAL, PERINEAL, UMBILICAL; SIMPLE/INTERMEDIATE REPR: 11470 (CPT??)  Note: Revisions to procedures should be made in chart - see Procedures activity.    Performing Service: Librarian, academic) and Role:     * Arsenio Katz, MD - Primary     [facility] Jewel, MD - Resident - Assisting    Assistant: None    Findings: left buttock hidradenitis excised    Anesthesia: General    Estimated Blood Loss: 10 mL    Complications: None    Specimens: None collected    Implants: * No implants in log *    Surgeon Notes: I was present and scrubbed for the entire procedure    Izola Price   Date: 09/14/2019  Time: 2:20 PM

## 2019-09-14 NOTE — Unmapped (Addendum)
Date: 09/14/2019  ??  Preoperative Diagnosis: Buttock hidradenitis  Postoperative Diagnosis: Same  ??  Procedure performed: excision of left gluteal hidradenitis measuring 20x12 cm  ??  Staff: Lamarr Lulas, MD  Resident: Aurther Loft, MD  ??  Blood Loss: 10 mL  ??  Anesthesia: General with local 1% lidocaine with epinephrine  ??  Specimen: left gluteal hidradenitis  ??  Indications: Ms Puga is a 54 y/o F with a history of hidradenitis, who presented to clinic to discuss options for surgical intervention.  We discussed a staged approach, and we she presents today for excision of the left gluteal hidradenitis as her preference.  ??  Description of the Procedure: Ms Paro was identified in the preoperative holding area, where her??history and physical exam were updated. ??She was marked, with the areas of concern confirmed with the patient, and??was then transported back to the operating room where??she was positioned supine on her stretcher.  A time out was called and the appropriate patient, procedure, and laterality were confirmed. ??General endotracheal anesthesia was then induced via the anesthesia team.  Ms Dugo was then turned on to the OR bed in the prone position. ??Preoperative antibiotics were assured to be administered. ??All pressure points were assured to be padded. ??The buttock was??prepped and draped in the usual sterile fashion.  ??  The area of drainage and scars from previous drainage had been marked with the patient in preop. ??The periphery of the planned incision was infiltrated with local anesthesia.  A 15 blade scalpel was used to incise the skin and the cautery was then used to complete the dissection. ??The tissues were palpated during the excision, with normal soft and healthy appearing tissue as the conclusion of the dissection. ??The tissue was measured and passed off the table as permanent specimen.  The excision area was then irrigated with normal saline. ??  ??  The excision areas were assessed again, with no obvious remaining diseased tissue. ??The area was large and could not be closed primarily.  Additional local was infiltrated in to the base of the wound and the periphery.  Artiss was sprayed at the base of the wound.  A dressing of wet to dry kerlix, followed by ABD pads and mesh underwear was then applied.    ??  She was awakened from anesthesia and transported to the PACU in stable condition. ??All instrument counts were correct at the conclusion of the case.

## 2019-09-15 NOTE — Unmapped (Signed)
Care Management  Initial Transition Planning Assessment              General  Care Manager assessed the patient by : In person interview with patient  Orientation Level: Oriented X4  Functional level prior to admission: Independent  Reason for referral: Discharge Planning    Contact/Decision [facility]  Extended Emergency Contact Information  Primary Emergency Contact: [facility]  Address: [address]           [place], [place] [address] [place]  Home Phone: [phone]  Work Phone: [phone]  Mobile Phone: [phone]  Relation: Sister  Preferred language: ENGLISH    Legal Next of Kin / Guardian / POA / Advance Directives     HCDM (HCPOA): Javier Glazier - Sister - [phone]    Advance Directive (Medical Treatment)  Does patient have an advance directive covering medical treatment?: Patient has advance directive covering medical treatment, copy not in chart.  Advance directive covering medical treatment not in Chart:: Copy requested from family    Health Care Decision Maker [HCDM] (Medical & Mental Health Treatment)  Healthcare Decision Maker: HCDM documented in the HCDM/Contact Info section.  Information offered on HCDM, Medical & Mental Health advance directives:: Other (Comment)(Annette Lolita Lenz [phone])         Patient Information  Lives with: Alone    Type of Residence: Private residence        Support Systems/Concerns: None       Home Care services in place prior to admission?: No        Equipment Currently Used at Home: walker, rolling       Currently receiving outpatient dialysis?: No       Financial Information       Need for financial assistance?: No       Social Determinants of Health  Social Determinants of Health were addressed in provider documentation.  Please refer to patient history.    Discharge Needs Assessment  Concerns to be Addressed: other (see comments)(home health wound care)    Clinical Risk Factors:      Barriers to taking medications: No    Prior overnight hospital stay or ED visit in last 90 days: No    Readmission Within the Last 30 Days: no previous admission in last 30 days         Anticipated Changes Related to Illness: none    Equipment Needed After Discharge: none    Discharge Facility/Level of Care Needs:      Readmission  Risk of Unplanned Readmission Score:  %  Predictive Model Details   No score data available for [facility] Risk of Unplanned Readmission     Readmitted Within the Last 30 Days? (No if blank)   Patient at risk for readmission?: N/A    Discharge Plan  Screen findings are: Care Manager reviewed the plan of the patient's care with the Multidisciplinary Team. No discharge planning needs identified at this time. Care Manager will continue to manage plan and monitor patient's progress with the team.    Expected Discharge Date:     Expected Transfer from Critical Care:      Quality data for continuing care services shared with patient and/or representative?: N/A  Patient and/or family were provided with choice of facilities / services that are available and appropriate to meet post hospital care needs?: Yes   List choices in order highest to lowest preferred, if applicable. : home health patient states no preferance.  Initial Assessment complete?: Yes

## 2019-09-15 NOTE — Unmapped (Signed)
PLASTIC SURGERY  PROGRESS NOTE    Pager: [phone]    Admit Date: 09/14/2019, Hospital Day: 2    Assessment     Elizabeth Browning??is a 54 y.o.??female??with hx HS to bilateral buttocks, Hurley 3, s/p excision of R buttock HS on 03/09/19. She presented on 09/14/19 for planned excision of left buttock HS on 09/14/19.     Interval Events:  NAEO. AFVSS. Used 1x prn dilaudid and 1x prn oxy overnight. Reports that pain is not as well controlled as she would like.    Plan     - Multimodal pain control: scheduled APAP, scheduled gaba, prn ibuprofen, prn oxy 5-10, prn dilaudid  - Benadryl prn for itching  - Home inhaler + prn duonebs  - Wound care: BID w2d kerlix, ABD pads, mesh underwear  - Regular diet, ML  - Miralax  - No activity restrictions  - SQH for DVT ppx    Please page the [facility] service pager at [phone] with questions or concerns.        Objective     Physical Exam:    Gen: appropriate, NAD  Neuro: A&Ox4, pain well controlled  Pulmonary: Normal respiratory effort.  Cardiovascular: Regular rate.   Abdo: obese, soft  GU: Voiding spontaneously.   Extr: Warm/well perfused, no edema  Buttocks: dressing is intact with minimal SS strikethrough. No evidence of active bleeding.    Vitals:   Temp:  [36.5 ??C-36.7 ??C] 36.5 ??C  Heart Rate:  [57-90] 68  SpO2 Pulse:  [57-86] 69  Resp:  [12-22] 18  BP: (86-143)/(48-97) 126/97  MAP (mmHg):  [67-106] 106  SpO2:  [93 %-100 %] 97 %  BMI (Calculated):  [37.31] 37.31    Intake/Output last 24 hours:  I/O last 3 completed shifts:  In: 550 [P.O.:50; I.V.:500]  Out: 10 [Blood:10]    -----------------------------------------------------    Data Review:    Labs:  Lab Results   Component Value Date    WBC 13.7 (H) 03/15/2019    HGB 12.3 03/15/2019    HCT 39.2 03/15/2019    PLT 393 03/15/2019       Lab Results   Component Value Date    NA 135 03/13/2019    K 4.7 03/13/2019    CL 100 03/13/2019    CO2 27.0 03/13/2019    BUN 6 (L) 03/13/2019    CREATININE 0.96 03/15/2019    GLU 153 03/13/2019 CALCIUM 9.9 03/13/2019    MG 1.5 (L) 03/13/2019    PHOS 4.7 03/13/2019

## 2019-09-16 NOTE — Unmapped (Signed)
Patient transferred from 1 [facility],plan of care reviewed as needed.No falls this shift.Ambulates independently . VS stable.Wet to dry dressing bid.Tolerating diet.Verbalized adequate pain relief with current pain regimen.Urine output adequate.All questions answered at this time .Will continue to monitor.    Problem: Latex Allergy  Goal: Absence of Allergy Symptoms  Outcome: Progressing     Problem: Adult Inpatient Plan of Care  Goal: Plan of Care Review  Outcome: Progressing  Goal: Patient-Specific Goal (Individualization)  Outcome: Progressing  Goal: Absence of Hospital-Acquired Illness or Injury  Outcome: Progressing  Goal: Optimal Comfort and Wellbeing  Outcome: Progressing  Goal: Readiness for Transition of Care  Outcome: Progressing  Goal: Rounds/Family Conference  Outcome: Progressing     Problem: Wound  Goal: Optimal Wound Healing  Outcome: Progressing     Problem: Self-Care Deficit  Goal: Improved Ability to Complete Activities of Daily Living  Outcome: Progressing     Problem: Fall Injury Risk  Goal: Absence of Fall and Fall-Related Injury  Outcome: Progressing

## 2019-09-16 NOTE — Unmapped (Signed)
Review POC with patient who indicates understanding. VSS. O2 stable and WNL on room air. Urine output remains adequate voiding. Pt tolerating reg diet without complaint of nausea or vomiting. Pain controlled with scheduled & PRN pain medications. Pt ambulates with use of assistive device. Pass gas. No BM this shift. Wet to dry dressing change to buttocks completed this shift. Itching controlled with PRN meds. Pt remains free of falls and injury this shift. No pt questions or concerns at this time. Will continue to monitor.     Problem: Latex Allergy  Goal: Absence of Allergy Symptoms  Outcome: Progressing     Problem: Adult Inpatient Plan of Care  Goal: Plan of Care Review  Outcome: Progressing  Goal: Patient-Specific Goal (Individualization)  Outcome: Progressing  Goal: Absence of Hospital-Acquired Illness or Injury  Outcome: Progressing  Goal: Optimal Comfort and Wellbeing  Outcome: Progressing  Goal: Readiness for Transition of Care  Outcome: Progressing  Goal: Rounds/Family Conference  Outcome: Progressing     Problem: Wound  Goal: Optimal Wound Healing  Outcome: Progressing     Problem: Self-Care Deficit  Goal: Improved Ability to Complete Activities of Daily Living  Outcome: Progressing     Problem: Fall Injury Risk  Goal: Absence of Fall and Fall-Related Injury  Outcome: Progressing

## 2019-09-16 NOTE — Unmapped (Signed)
PLASTIC SURGERY  PROGRESS NOTE    Pager: [phone]    Admit Date: 09/14/2019, Hospital Day: 3    Assessment     Elizabeth Browningis a 54 y.o.??female??with hx HS to bilateral buttocks, Hurley 3, s/p excision of R buttock HS on 03/09/19. She presented on 09/14/19 for planned excision of left buttock HS on 09/14/19.     Interval Events:  NAEO.  Remains afebrile.  Required 5 doses of IV medication in the last 24 hours, in addition to 5, 10, 10 oxy. States pain has been a challenge.    Plan     - Multimodal pain control: scheduled APAP, scheduled gaba to 600 TID, schedule ibuprofen, prn oxy 5-10 q3, prn dilaudid  - Benadryl prn for itching  - Home inhaler + prn duonebs  - Wound care: BID w2d kerlix, ABD pads, mesh underwear  - Regular diet, ML  - Miralax  - No activity restrictions  - SQH for DVT ppx    Discharge plan: request assistance with arranging HH.    Please page the [facility] service pager at [phone] with questions or concerns.        Objective     Physical Exam:    Gen: appropriate, NAD  Neuro: A&Ox4, pain well controlled  Pulmonary: Normal respiratory effort.  Cardiovascular: Regular rate.   Abdo: obese, soft  GU: Voiding spontaneously.   Extr: Warm/well perfused, no edema       Vitals:   Temp:  [36.5 ??C-37.1 ??C] 36.8 ??C  Heart Rate:  [60-75] 75  Resp:  [16-18] 18  BP: (100-151)/(51-97) 140/73  MAP (mmHg):  [69-107] 74  SpO2:  [96 %-100 %] 96 %    Intake/Output last 24 hours:  I/O last 3 completed shifts:  In: 0   Out: 850 [Urine:850]    -----------------------------------------------------    Data Review:    Labs:  Lab Results   Component Value Date    WBC 13.7 (H) 03/15/2019    HGB 12.3 03/15/2019    HCT 39.2 03/15/2019    PLT 393 03/15/2019       Lab Results   Component Value Date    NA 135 03/13/2019    K 4.7 03/13/2019    CL 100 03/13/2019    CO2 27.0 03/13/2019    BUN 6 (L) 03/13/2019    CREATININE 0.96 03/15/2019    GLU 153 03/13/2019    CALCIUM 9.9 03/13/2019    MG 1.5 (L) 03/13/2019    PHOS 4.7 03/13/2019

## 2019-09-17 NOTE — Unmapped (Signed)
POC reviewed with the patient. Patient s/p hidradenitis excision. Patient is A&Ox4. NAD. Patient complains of pain that is controlled with oral & IV pain medication. Patient remains afebrile and exhibits no signs and symptoms of infection. Patient is voiding and the urinary output has remained adequate. Patient continues on a regular diet and is tolerating well. Patient is able to ambulate independently and has remained free from falls/injury. Bed in a low and locked position. Call bell within reach at all times. Will continue to monitor.         Problem: Latex Allergy  Goal: Absence of Allergy Symptoms  Outcome: Progressing     Problem: Adult Inpatient Plan of Care  Goal: Plan of Care Review  Outcome: Progressing  Goal: Patient-Specific Goal (Individualization)  Outcome: Progressing  Goal: Absence of Hospital-Acquired Illness or Injury  Outcome: Progressing  Goal: Optimal Comfort and Wellbeing  Outcome: Progressing  Goal: Readiness for Transition of Care  Outcome: Progressing  Goal: Rounds/Family Conference  Outcome: Progressing     Problem: Wound  Goal: Optimal Wound Healing  Outcome: Progressing     Problem: Self-Care Deficit  Goal: Improved Ability to Complete Activities of Daily Living  Outcome: Progressing     Problem: Fall Injury Risk  Goal: Absence of Fall and Fall-Related Injury  Outcome: Progressing

## 2019-09-17 NOTE — Unmapped (Signed)
Review POC with patient who indicates understanding. VSS. O2 stable and WNL on room air. Urine output remains adequate voiding. Pt tolerating regular diet without complaint of nausea or vomiting. Pain controlled with scheduled & PRN pain medications. Pt ambulates with use of assistive device independently. Pt states she had a BM yesterday. Left buttock dressing changed twice this shift due to drainage and falling off. Pt remains free of falls and injury this shift. No pt questions or concerns at this time. Will continue to monitor.     Problem: Latex Allergy  Goal: Absence of Allergy Symptoms  Outcome: Progressing     Problem: Adult Inpatient Plan of Care  Goal: Plan of Care Review  Outcome: Progressing  Goal: Patient-Specific Goal (Individualization)  Outcome: Progressing  Goal: Absence of Hospital-Acquired Illness or Injury  Outcome: Progressing  Goal: Optimal Comfort and Wellbeing  Outcome: Progressing  Goal: Readiness for Transition of Care  Outcome: Progressing  Goal: Rounds/Family Conference  Outcome: Progressing     Problem: Wound  Goal: Optimal Wound Healing  Outcome: Progressing     Problem: Self-Care Deficit  Goal: Improved Ability to Complete Activities of Daily Living  Outcome: Progressing     Problem: Fall Injury Risk  Goal: Absence of Fall and Fall-Related Injury  Outcome: Progressing

## 2019-09-17 NOTE — Unmapped (Signed)
VENOUS ACCESS ULTRASOUND PROCEDURE NOTE    Indications:   Poor venous access.    The Venous Access Team has assessed this patient for the placement of a PIV. Ultrasound guidance was necessary to obtain access.     Procedure Details:  Identity of the patient was confirmed via name, medical record number and date of birth. The availability of the correct equipment was verified.    The vein was identified for ultrasound catheter insertion.  Field was prepared with necessary supplies and equipment.  Probe cover and sterile gel utilized.  Insertion site was prepped with chlorhexidine solution and allowed to dry.  The catheter extension was primed with normal saline.A(n) 22 g x 1.75 inch catheter was placed in the right forearm with 1 attempt(s).     Catheter aspirated, 4 mL blood return present. The catheter was then flushed with 10 mL of normal saline. Insertion site cleansed, and dressing applied per manufacturer guidelines. The catheter was inserted without difficulty  by Michel Santee RN.      RN was notified.     Thank you,     Michel Santee RN Venous Access Team   [phone]     Workup / Procedure Time:  30 minutes    See vein image(s) in PACs.

## 2019-09-17 NOTE — Unmapped (Signed)
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 She presented on 09/14/19 for planned excision of left buttock HS on 09/14/19.     Interval Events:  NAEO.  Remains afebrile.  Required 3 doses of IV medication in the last 24 hours.     Plan     - Multimodal pain control: scheduled APAP, scheduled gaba to 600 TID, schedule ibuprofen, prn oxy 5-10. Stop prn dilaudid IV, add prn oxy 10 for wound changes   - Benadryl prn for itching  - Home inhaler + prn duonebs  - Wound care: BID w2d kerlix, ABD pads, mesh underwear  - Regular diet, ML  - Miralax  - No activity restrictions  - SQH for DVT ppx    Discharge plan: request assistance with arranging HH.    Please page the [facility] service pager at [phone] with questions or concerns.        Objective     Physical Exam:    Gen: appropriate, NAD  Neuro: , pain well controlled  Pulmonary: Normal respiratory effort.  Cardiovascular: Regular rate.   Abdo: obese,  GU: Voiding spontaneously.   Wound: Visible adipose tissue, appears well perfused. No active bleeding, purulence.       Wound photo 3/5         Vitals:   Temp:  [36.1 ??C-37.6 ??C] 37.6 ??C  Heart Rate:  [70-72] 72  Resp:  [17-19] 18  BP: (119-124)/(56-66) 123/66  MAP (mmHg):  [74-80] 80  SpO2:  [93 %-96 %] 93 %    Intake/Output last 24 hours:  I/O last 3 completed shifts:  In: 640 [P.O.:640]  Out: 3900 [Urine:3900]    -----------------------------------------------------    Data Review:    Labs:  Lab Results   Component Value Date    WBC 13.7 (H) 03/15/2019    HGB 12.3 03/15/2019    HCT 39.2 03/15/2019    PLT 393 03/15/2019       Lab Results   Component Value Date    NA 135 03/13/2019    K 4.7 03/13/2019    CL 100 03/13/2019    CO2 27.0 03/13/2019    BUN 6 (L) 03/13/2019    CREATININE 0.96 03/15/2019    GLU 153 03/13/2019    CALCIUM 9.9 03/13/2019    MG 1.5 (L) 03/13/2019    PHOS 4.7 03/13/2019

## 2019-09-18 MED ORDER — GABAPENTIN 300 MG CAPSULE
ORAL_CAPSULE | Freq: Three times a day (TID) | ORAL | 0 refills | 14.00000 days | Status: CP
Start: 2019-09-18 — End: 2019-10-02

## 2019-09-18 MED ORDER — OXYCODONE 5 MG TABLET
ORAL_TABLET | Freq: Three times a day (TID) | ORAL | 0 refills | 6.00000 days | Status: CP | PRN
Start: 2019-09-18 — End: 2019-09-18

## 2019-09-18 MED ORDER — OXYCODONE 5 MG TABLET: 10 mg | tablet | Freq: Three times a day (TID) | 0 refills | 6 days | Status: AC

## 2019-09-18 MED ORDER — POLYETHYLENE GLYCOL 3350 17 GRAM ORAL POWDER PACKET
PACK | Freq: Every day | ORAL | 0 refills | 14.00000 days | Status: CP | PRN
Start: 2019-09-18 — End: 2019-09-18

## 2019-09-18 MED ORDER — GABAPENTIN 300 MG CAPSULE: 600 mg | capsule | Freq: Three times a day (TID) | 0 refills | 14 days | Status: AC

## 2019-09-18 MED ORDER — ACETAMINOPHEN 500 MG TABLET: 1000 mg | tablet | Freq: Three times a day (TID) | 0 refills | 14 days | Status: AC

## 2019-09-18 MED ORDER — ACETAMINOPHEN 500 MG TABLET
ORAL_TABLET | Freq: Three times a day (TID) | ORAL | 0 refills | 14.00000 days | Status: CP | PRN
Start: 2019-09-18 — End: 2019-09-18

## 2019-09-18 MED ORDER — POLYETHYLENE GLYCOL 3350 17 GRAM ORAL POWDER PACKET: 17 g | packet | Freq: Every day | 0 refills | 14 days | Status: AC

## 2019-09-18 NOTE — Unmapped (Signed)
Plan of care reviewed at beginning of shift and as needed.No falls this shift.Ambulates independently with use of rolling walker . VS stable.Tolerating diet.Verbalized adequate pain relief with current pain regimen.Dressing changed X3 this shift,large amount serosanguinous drainage.Urine output adequate.Anticoagulant given as prescribed.All questions answered at this time .Will continue to monitor.    Problem: Latex Allergy  Goal: Absence of Allergy Symptoms  Outcome: Progressing     Problem: Adult Inpatient Plan of Care  Goal: Plan of Care Review  Outcome: Progressing  Goal: Patient-Specific Goal (Individualization)  Outcome: Progressing  Goal: Absence of Hospital-Acquired Illness or Injury  Outcome: Progressing  Goal: Optimal Comfort and Wellbeing  Outcome: Progressing  Goal: Readiness for Transition of Care  Outcome: Progressing  Goal: Rounds/Family Conference  Outcome: Progressing     Problem: Wound  Goal: Optimal Wound Healing  Outcome: Progressing     Problem: Self-Care Deficit  Goal: Improved Ability to Complete Activities of Daily Living  Outcome: Progressing     Problem: Fall Injury Risk  Goal: Absence of Fall and Fall-Related Injury  Outcome: Progressing     Problem: Latex Allergy  Goal: Absence of Allergy Symptoms  Outcome: Progressing     Problem: Adult Inpatient Plan of Care  Goal: Plan of Care Review  Outcome: Progressing  Goal: Patient-Specific Goal (Individualization)  Outcome: Progressing  Goal: Absence of Hospital-Acquired Illness or Injury  Outcome: Progressing  Goal: Optimal Comfort and Wellbeing  Outcome: Progressing  Goal: Readiness for Transition of Care  Outcome: Progressing  Goal: Rounds/Family Conference  Outcome: Progressing     Problem: Wound  Goal: Optimal Wound Healing  Outcome: Progressing     Problem: Self-Care Deficit  Goal: Improved Ability to Complete Activities of Daily Living  Outcome: Progressing     Problem: Fall Injury Risk  Goal: Absence of Fall and Fall-Related Injury Outcome: Progressing

## 2019-09-18 NOTE — Unmapped (Signed)
Patient remains stable at this time. Alert and oriented. No acute distress noted. Respirations even and unlabored with bilateral lung expansion. On room air. VSS. Medicated as per MAR. Dressing to the gluteal changed. Safety measures in place. Bed low and locked. Call bell within reach.     Problem: Latex Allergy  Goal: Absence of Allergy Symptoms  Outcome: Ongoing - Unchanged     Problem: Adult Inpatient Plan of Care  Goal: Plan of Care Review  Outcome: Ongoing - Unchanged  Goal: Patient-Specific Goal (Individualization)  Outcome: Ongoing - Unchanged  Goal: Absence of Hospital-Acquired Illness or Injury  Outcome: Ongoing - Unchanged  Goal: Optimal Comfort and Wellbeing  Outcome: Ongoing - Unchanged  Goal: Readiness for Transition of Care  Outcome: Ongoing - Unchanged  Goal: Rounds/Family Conference  Outcome: Ongoing - Unchanged     Problem: Wound  Goal: Optimal Wound Healing  Outcome: Ongoing - Unchanged     Problem: Self-Care Deficit  Goal: Improved Ability to Complete Activities of Daily Living  Outcome: Ongoing - Unchanged     Problem: Fall Injury Risk  Goal: Absence of Fall and Fall-Related Injury  Outcome: Ongoing - Unchanged

## 2019-09-18 NOTE — Unmapped (Signed)
Pt is A&O x4 and verbalizes understanding of discharge instructions.  OOB independently this shift, no falls.  Tolerating regular diet, no complaints of n/v.  Wounds C/D/I; pt verbalized understanding of home wound care this shift.  WTD changed this morning after her shower, pt tolerated well.  VSS, BM this morning, no complaints or concerns.  Will continue to monitor until discharge.    Problem: Latex Allergy  Goal: Absence of Allergy Symptoms  Outcome: Discharged to Home     Problem: Adult Inpatient Plan of Care  Goal: Plan of Care Review  Outcome: Discharged to Home  Goal: Patient-Specific Goal (Individualization)  Outcome: Discharged to Home  Goal: Absence of Hospital-Acquired Illness or Injury  Outcome: Discharged to Home  Goal: Optimal Comfort and Wellbeing  Outcome: Discharged to Home  Goal: Readiness for Transition of Care  Outcome: Discharged to Home  Goal: Rounds/Family Conference  Outcome: Discharged to Home     Problem: Wound  Goal: Optimal Wound Healing  Outcome: Discharged to Home     Problem: Self-Care Deficit  Goal: Improved Ability to Complete Activities of Daily Living  Outcome: Discharged to Home     Problem: Fall Injury Risk  Goal: Absence of Fall and Fall-Related Injury  Outcome: Discharged to Home

## 2019-09-18 NOTE — Unmapped (Addendum)
Elizabeth Browning is a 54 y.o. female with history of HS to bilateral buttocks, Hurley 3, s/p excision of R buttock HS on 03/09/19, that was admitted to the hospital on 09/14/2019 for post-operative pain control and wound care. The patient was taken to the OR on 09/14/2019 for  planned excision of left buttock HS. She tolerated the procedure well, was extubated in the OR, and was taken to the PACU where she received routine postoperative care before being transferred to the floor.     She did well postoperatively. Her diet was slowly advanced and at the time of discharge she was tolerating the expected diet. The patient was able to void spontaneously.  She initially had difficulties with pain control requiring IV pain medication, however we were able to wean patient off IV pain medications and she tolerated p.o. She is being discharged on 09/18/19 (POD4) to home in stable condition with planned outpatient follow-up.  Patient will have assistance with caregiver at home for wet-to-dry dressing changes twice daily.    Antibiotic plan at discharge: none  Drains present at discharge: none

## 2019-09-18 NOTE — Unmapped (Signed)
Discharge Summary    Admit date: 09/14/2019    Discharge date and time:   September 18, 2019     Discharge to:  Home    Discharge Service: Surg Plastic [facility])    Discharge Attending Physician: Arsenio Katz, *    Discharge  Diagnoses: excision of left hidradenitis    Secondary Diagnosis: Principal Problem:    Hidradenitis      OR Procedures: Left - R24, EXCISION SKIN/SUBCUTANEOUS TISSUE FOR HIDRADENITIS, PERIANAL, PERINEAL, UMBILICAL; SIMPLE/INTERMEDIATE REPR 09/14/2019     Ancillary Procedures: no procedures    Discharge Day Services: The patient was seen and examined by the Plastic Surgery team on the day of discharge.  Vital signs and laboratory values were stable and within normal limits.  Surgical wounds were examined.  Discharge plan was discussed, instructions for home care were given, and all questions answered.  Patient will continue to perform twice a day dressing changes (wet-to-dry) as demonstrated.  Patient will have assistance at home with caregiver as discussed.      Subjective  No acute events overnight. Pain Controlled. No fever or chills.  Patient did well with dressing change yesterday.    Objective   BP 116/52  - Pulse 73  - Temp 36.6 ??C (Oral)  - Resp 20  - Ht 177.8 cm (5' 10)  - Wt (!) 117.9 kg (260 lb)  - SpO2 98%  - BMI 37.31 kg/m??     General: well-appearing  Resp:  Non labored breathing on RA  CV:  Regular rate  Abdomen: Nondistended  Neuro:  Grossly intact  Psych:  Mood and affect appropriate  Wound: Visible adipose tissue. Small area with minimal bleeding after removal of wet dressing.Tissue appears well perfused.  No purulent drainage, no exudate.             Hospital Course:     Elizabeth Browning is a 54 y.o. female with history of HS to bilateral buttocks, [place] 3, s/p excision of R buttock HS on 03/09/19, that was admitted to the hospital on 09/14/2019 for post-operative pain control and wound care. The patient was taken to the OR on 09/14/2019 for  planned excision of left buttock HS. She tolerated the procedure well, was extubated in the OR, and was taken to the PACU where she received routine postoperative care before being transferred to the floor.     She did well postoperatively. Her diet was slowly advanced and at the time of discharge she was tolerating the expected diet. The patient was able to void spontaneously.  She initially had difficulties with pain control requiring IV pain medication, however we were able to wean patient off IV pain medications and she tolerated p.o. She is being discharged on 09/18/19 (POD4) to home in stable condition with planned outpatient follow-up.  Patient will have assistance with caregiver at home for wet-to-dry dressing changes twice daily.    Antibiotic plan at discharge: none  Drains present at discharge: none          Condition at Discharge: Improved  Discharge Medications:      Your Medication List      START taking these medications    acetaminophen 500 MG tablet  Commonly known as: TYLENOL  Take 2 tablets (1,000 mg total) by mouth every eight (8) hours as needed for pain (mild to moderate) for up to 14 days.     gabapentin 300 MG capsule  Commonly known as: NEURONTIN  Take 2 capsules (600 mg  total) by mouth Three (3) times a day for 14 days.     polyethylene glycol 17 gram packet  Commonly known as: MIRALAX  Take 17 g by mouth daily as needed (Take while using narcotic pain medication) for up to 14 days.        CHANGE how you take these medications    oxyCODONE 5 MG immediate release tablet  Commonly known as: ROXICODONE  Take 2 tablets (10 mg total) by mouth every eight (8) hours as needed for pain (use for break through pain if other meds do not help. use for dressing changes as needed) for up to 6 days.  What changed:   ?? how much to take  ?? when to take this  ?? reasons to take this        CONTINUE taking these medications    ADVAIR DISKUS 250-50 mcg/dose diskus  Generic drug: fluticasone propion-salmeteroL  INHALE 1 PUFF BY MOUTH TWICE DAILY 12 HOURS APART     albuterol 90 mcg/actuation inhaler  Commonly known as: PROVENTIL HFA;VENTOLIN HFA  Inhale 2 puffs every four (4) hours as needed.     DEXILANT 60 mg capsule  Generic drug: dexlansoprazole  TK 1 C PO QAM FOR HEARTBURN AND ABDOMINAL PAIN     empty container Misc  use as directed     HUMIRA(CF) PEN 40 mg/0.4 mL injection  Generic drug: adalimumab  Inject the contents of 2 pens (80mg ) under the skin every 7 days as directed by prescriber     ibuprofen 800 MG tablet  Commonly known as: MOTRIN  Take 800 mg by mouth every six (6) hours as needed.     ipratropium-albuteroL 0.5-2.5 mg/3 mL nebulizer  Commonly known as: DUO-NEB  USE 1 VIAL VIA NEBULIZER EVERY 4 TO 6 HOURS AS NEEDED FOR COUGH OR WHEEZING     traZODone 50 MG tablet  Commonly known as: DESYREL  Take 50-100 mg by mouth nightly.                 Discharge Instructions:    Other Instructions     Discharge instructions      [facility] Plastic Surgery  Discharge Instructions for Elizabeth Browning, M.D.        Procedure performed: left buttock hidradenitis excision    Medications:    Antibiotics:  - You do not require antibiotics after surgery.    Pain and pain medication:  - You have been prescribed a narcotic pain medication. Take this as directed. Do not drive, operate heavy machinery, or make important decisions while taking these medications. Do not drink alcohol or take illegal drugs while on narcotic pain medication. These medications may cause constipation, itching, and nausea.  - You have been prescribed acetaminophen (Tylenol), gabapentin (Neurontin), and baclofen (Lioresal) as part of a pain control regimen. You should take these every day, as directed, to help reduce your pain after surgery.  - You may alternate acetaminophen (Tylenol) and ibuprofen (Advil or Motrin) over the counter for pain relief. Follow the instructions on the bottle. Do not exceed 4 grams (4,000 mg) of acetaminophen/Tylenol per day.    Stool softeners:  - Prescription narcotic pain medication, such as oxycodone, Percocet, or Vicodin, can cause constipation. Anaesthesia may also cause constipation. If you become constipated after surgery, you should take polyethylene glycol (Miralax) or sennokot (Senna) to help you have regular bowel movements. Follow the instructions on the bottle.  - If you are constipated after surgery, do not use  Colace, as this can make constipation worse while you are taking narcotics.  - If you are constipated after surgery, you should ensure that you have adequate fiber in your diet (>25 grams per day) and drink at least 64oz of water daily. If you become constipated even after using these medications, you may take magnesium citrate. Follow the instructions on the bottle.    Blood thinners:  - You do not need a prescription blood thinner after surgery.    Home medications:  - You should resume your home medications.         Restrictions:    Activity Restrictions:  - Do not exercise, No exercise, no strenuous activity, no yoga. Do not lift any objects heavier than 5 lbs.  - No housework, yardwork, dishwashing, lawn mowing, shovelling, sweeping, Swiffering, cleaning the counters, gardening, dog-walking, swimming, exercising of any kind, yoga, Pilates.  - Walking is encouraged. Do not power-walk, but you may go for gentle walks.    What foods can I eat?:  Normal: You may resume eating normal foods. You do not have any food restrictions.    Showering and Bathing:  - You may shower after surgery. When you shower, allow warm water and soap to run over the surgical site. Dab the area dry, but do not scrub the area. Keep the surgical site clean and dry at all times otherwise.  - Absolutely no submerging under water. No baths, no hot tubs, no swimming, no pools, no lakes, no rivers, no oceans, etc. Doing so will significantly increase your risk of infection.        Wounds and Dressings:    Wounds and Dressings:  - You have a wet-to-dry dressing in place. You should change this dressing two times per day.   - You have absorbant dressings called ABD pads. You should change these daily, as well as when they become soiled.  - Keep your dressing(s) clean and dry. If you see some light bloody wound seepage through the bandage, do not worry as this is normal. Replace your dressings if needed.  - Continue dressing the site(s) of surgery until your follow up appointment.        When should I call my surgeon?:    When should I call?:  In case of emergency:  - Please call 911 or go to the nearest emergency room.    Please call if you develop any of the following symptoms:  - Fever, i.e. temperature greater than 101.3??F (or 38.5??C) for adults, or 100.4??F (or 38.0??C) for children  - Persistent nausea or vomiting that does not go away  - Severe pain that cannot be controlled with prescription and over-the-counter pain medication  - Rapidly increasing swelling of at the site of surgery  - Redness, tenderness, foul odor, or thick green or yellow discharge from a surgical site  - Large amounts of blood coming from your incisions or surgical sites  - Any other concerning symptom    Who should I call?:  In case of emergency, call 911 or go to the nearest emergency room.    For Monday through Friday, 8AM to 5PM:  - Dr. Dorothy Spark nurses are Elisabeth Pigeon, RN; and Sarita Bottom, RN. Joni Reining can be reached at [phone], or via e-mail at [email or web address] Gean Maidens can be reached at [phone]) [phone], or via e-mail at [email or web address]  - If you are unable to reach the above nurse, please call the adult Plastic Surgery clinic at [phone], or the pediatric  Plastic Surgery clinic at [phone].    For Monday through Friday, after 5PM; and for weekends, 24/7:  - If you have a scheduling question, please call Monday through Friday during business hours.  - If you have an urgent medical issue related to your surgery, please call the [facility] operator at [phone], and ask them to page the Plastic Surgery resident on call. The operator will contact the Plastic Surgery resident, and you should receive a call back within 1-2 hours.    Plastic Surgery Clinics:  [facility] Plastic Surgery Clinic, [facility] - [phone]  [facility] - [phone]  [facility] Pediatric Plastic Surgery Clinic, [facility] - [phone]  [facility] Children's Specialty Services at [place] - [phone]  [facility] - [phone]         Follow-Up Appointments:  Your follow-up appointment has already been scheduled. Please see your discharge paperwork for the time and date. To change your follow-up appointment time, please call the Plastic Surgery clinic at [phone].         [facility] Plastic Surgery  Discharge Instructions for Elizabeth Browning, M.D.        Procedure performed: left buttock hidradenitis excision    Medications:    Antibiotics:  - You do not require antibiotics after surgery.    Pain and pain medication:  - You have been prescribed a narcotic pain medication. Take this as directed. Do not drive, operate heavy machinery, or make important decisions while taking these medications. Do not drink alcohol or take illegal drugs while on narcotic pain medication. These medications may cause constipation, itching, and nausea.  - You have been prescribed acetaminophen (Tylenol), gabapentin (Neurontin), and baclofen (Lioresal) as part of a pain control regimen. You should take these every day, as directed, to help reduce your pain after surgery.  - You may alternate acetaminophen (Tylenol) and ibuprofen (Advil or Motrin) over the counter for pain relief. Follow the instructions on the bottle. Do not exceed 4 grams (4,000 mg) of acetaminophen/Tylenol per day.    Stool softeners:  - Prescription narcotic pain medication, such as oxycodone, Percocet, or Vicodin, can cause constipation. Anaesthesia may also cause constipation. If you become constipated after surgery, you should take polyethylene glycol (Miralax) or sennokot (Senna) to help you have regular bowel movements. Follow the instructions on the bottle.  - If you are constipated after surgery, do not use Colace, as this can make constipation worse while you are taking narcotics.  - If you are constipated after surgery, you should ensure that you have adequate fiber in your diet (>25 grams per day) and drink at least 64oz of water daily. If you become constipated even after using these medications, you may take magnesium citrate. Follow the instructions on the bottle.    Blood thinners:  - You do not need a prescription blood thinner after surgery.    Home medications:  - You should resume your home medications.         Restrictions:    Activity Restrictions:  - Do not exercise, No exercise, no strenuous activity, no yoga. Do not lift any objects heavier than 5 lbs.  - No housework, yardwork, dishwashing, lawn mowing, shovelling, sweeping, Swiffering, cleaning the counters, gardening, dog-walking, swimming, exercising of any kind, yoga, Pilates.  - Walking is encouraged. Do not power-walk, but you may go for gentle walks.    What foods can I eat?:  Normal: You  may resume eating normal foods. You do not have any food restrictions.    Showering and Bathing:  - You may shower after surgery. When you shower, allow warm water and soap to run over the surgical site. Dab the area dry, but do not scrub the area. Keep the surgical site clean and dry at all times otherwise.  - Absolutely no submerging under water. No baths, no hot tubs, no swimming, no pools, no lakes, no rivers, no oceans, etc. Doing so will significantly increase your risk of infection.        Wounds and Dressings:    Wounds and Dressings:  - You have a wet-to-dry dressing in place. You should change this dressing two times per day.   - You have absorbant dressings called ABD pads. You should change these daily, as well as when they become soiled.  - Keep your dressing(s) clean and dry. If you see some light bloody wound seepage through the bandage, do not worry as this is normal. Replace your dressings if needed.  - Continue dressing the site(s) of surgery until your follow up appointment.        When should I call my surgeon?:    When should I call?:  In case of emergency:  - Please call 911 or go to the nearest emergency room.    Please call if you develop any of the following symptoms:  - Fever, i.e. temperature greater than 101.3??F (or 38.5??C) for adults, or 100.4??F (or 38.0??C) for children  - Persistent nausea or vomiting that does not go away  - Severe pain that cannot be controlled with prescription and over-the-counter pain medication  - Rapidly increasing swelling of at the site of surgery  - Redness, tenderness, foul odor, or thick green or yellow discharge from a surgical site  - Large amounts of blood coming from your incisions or surgical sites  - Any other concerning symptom    Who should I call?:  In case of emergency, call 911 or go to the nearest emergency room.    For Monday through Friday, 8AM to 5PM:  - Dr. Dorothy Spark nurses are Elisabeth Pigeon, RN; and Sarita Bottom, RN. Joni Reining can be reached at [phone], or via e-mail at [email or web address] Gean Maidens can be reached at [phone], or via e-mail at [email or web address]  - If you are unable to reach the above nurse, please call the adult Plastic Surgery clinic at [phone], or the pediatric Plastic Surgery clinic at [phone].    For Monday through Friday, after 5PM; and for weekends, 24/7:  - If you have a scheduling question, please call Monday through Friday during business hours.  - If you have an urgent medical issue related to your surgery, please call the [facility] operator at [phone], and ask them to page the Plastic Surgery resident on call. The operator will contact the Plastic Surgery resident, and you should receive a call back within 1-2 hours.    Plastic Surgery Clinics:  [facility] Surgery Clinic, [facility] - [phone]  [facility]esthetic, Laser, [facility] - [phone]  [facility] Pediatric Plastic Surgery Clinic, [facility] - [phone]  [facility] Children's Specialty Services at [place] - [phone]  [facility] - [phone]         Follow-Up Appointments:  Your follow-up appointment has already been scheduled. Please see your discharge paperwork for the time and date. To change your follow-up appointment time, please call  the Plastic Surgery clinic at [phone].        Labs and Other Follow-ups after Discharge:  Follow Up instructions and Outpatient Referrals     Discharge instructions      [facility] Plastic Surgery  Discharge Instructions for Elizabeth Browning, M.D.        Procedure performed: left buttock hidradenitis excision    Medications:    Antibiotics:  - You do not require antibiotics after surgery.    Pain and pain medication:  - You have been prescribed a narcotic pain medication. Take this as directed. Do not drive, operate heavy machinery, or make important decisions while taking these medications. Do not drink alcohol or take illegal drugs while on narcotic pain medication. These medications may cause constipation, itching, and nausea.  - You have been prescribed acetaminophen (Tylenol), gabapentin (Neurontin), and baclofen (Lioresal) as part of a pain control regimen. You should take these every day, as directed, to help reduce your pain after surgery.  - You may alternate acetaminophen (Tylenol) and ibuprofen (Advil or Motrin) over the counter for pain relief. Follow the instructions on the bottle. Do not exceed 4 grams (4,000 mg) of acetaminophen/Tylenol per day.    Stool softeners:  - Prescription narcotic pain medication, such as oxycodone, Percocet, or Vicodin, can cause constipation. Anaesthesia may also cause constipation. If you become constipated after surgery, you should take polyethylene glycol (Miralax) or sennokot (Senna) to help you have regular bowel movements. Follow the instructions on the bottle.  - If you are constipated after surgery, do not use Colace, as this can make constipation worse while you are taking narcotics.  - If you are constipated after surgery, you should ensure that you have adequate fiber in your diet (>25 grams per day) and drink at least 64oz of water daily. If you become constipated even after using these medications, you may take magnesium citrate. Follow the instructions on the bottle.    Blood thinners:  - You do not need a prescription blood thinner after surgery.    Home medications:  - You should resume your home medications.         Restrictions:    Activity Restrictions:  - Do not exercise, No exercise, no strenuous activity, no yoga. Do not lift any objects heavier than 5 lbs.  - No housework, yardwork, dishwashing, lawn mowing, shovelling, sweeping, Swiffering, cleaning the counters, gardening, dog-walking, swimming, exercising of any kind, yoga, Pilates.  - Walking is encouraged. Do not power-walk, but you may go for gentle walks.    What foods can I eat?:  Normal: You may resume eating normal foods. You do not have any food restrictions.    Showering and Bathing:  - You may shower after surgery. When you shower, allow warm water and soap to run over the surgical site. Dab the area dry, but do not scrub the area. Keep the surgical site clean and dry at all times otherwise.  - Absolutely no submerging under water. No baths, no hot tubs, no swimming, no pools, no lakes, no rivers, no oceans, etc. Doing so will significantly increase your risk of infection.        Wounds and Dressings:    Wounds and Dressings:  - You have a wet-to-dry dressing in place. You should change this dressing two times per day.   - You have absorbant dressings called ABD pads. You should change these daily, as well as when they become soiled.  - Keep your dressing(s) clean  and dry. If you see some light bloody wound seepage through the bandage, do not worry as this is normal. Replace your dressings if needed.  - Continue dressing the site(s) of surgery until your follow up appointment.        When should I call my surgeon?:    When should I call?:  In case of emergency:  - Please call 911 or go to the nearest emergency room.    Please call if you develop any of the following symptoms:  - Fever, i.e. temperature greater than 101.3??F (or 38.5??C) for adults, or 100.4??F (or 38.0??C) for children  - Persistent nausea or vomiting that does not go away  - Severe pain that cannot be controlled with prescription and over-the-counter pain medication  - Rapidly increasing swelling of at the site of surgery  - Redness, tenderness, foul odor, or thick green or yellow discharge from a surgical site  - Large amounts of blood coming from your incisions or surgical sites  - Any other concerning symptom    Who should I call?:  In case of emergency, call 911 or go to the nearest emergency room.    For Monday through Friday, 8AM to 5PM:  - Dr. Dorothy Spark nurses are Elisabeth Pigeon, RN; and Sarita Bottom, RN. Joni Reining can be reached at [phone], or via e-mail at [email or web address] Gean Maidens can be reached at [phone], or via e-mail at [email or web address]  - If you are unable to reach the above nurse, please call the adult Plastic Surgery clinic at [phone], or the pediatric Plastic Surgery clinic at [phone].    For Monday through Friday, after 5PM; and for weekends, 24/7:  - If you have a scheduling question, please call Monday through Friday during business hours.  - If you have an urgent medical issue related to your surgery, please call the [facility] operator at [phone], and ask them to page the Plastic Surgery resident on call. The operator will contact the Plastic Surgery resident, and you should receive a call back within 1-2 hours.    Plastic Surgery Clinics:  [facility] Plastic Surgery Clinic, [facility] - [phone]  [facility]hetic, Laser, [facility] Center - [phone]  [facility] Pediatric Plastic Surgery Clinic, [facility] - [phone]  [facility] Children's Specialty Services at [place] - [phone]  [facility] - [phone]         Follow-Up Appointments:  Your follow-up appointment has already been scheduled. Please see your discharge paperwork for the time and date. To change your follow-up appointment time, please call the Plastic Surgery clinic at [phone].        Future Appointments:  Appointments which have been scheduled for you    Sep 27, 2019 11:40 AM  (Arrive by 11:10 AM)  RETURN  GENERAL with Arsenio Katz, MD  [facility] PLASTIC AND HAND SURGERY [place] [facility] REGION) [address]  [address]  [place] [address]  [phone]      Oct 04, 2019  1:10 PM  (Arrive by 12:40 PM)  POST OP with Arsenio Katz, MD  [facility] PLASTIC AND HAND SURGERY [place] [facility] REGION) [address]  [address]  [place] [address]  [phone]      Oct 11, 2019  3:10 PM  (Arrive by 2:40 PM)  POST OP with Arsenio Katz, MD  [facility] PLASTIC AND HAND SURGERY  ([place]) [address]  [address] Floor  [place] [address]  [phone]

## 2019-09-21 MED ORDER — CYCLOBENZAPRINE 10 MG TABLET
ORAL_TABLET | Freq: Three times a day (TID) | ORAL | 0 refills | 14 days | Status: CP
Start: 2019-09-21 — End: 2019-10-05

## 2019-09-21 MED ORDER — OXYCODONE 10 MG TABLET
ORAL_TABLET | 0 refills | 0 days | Status: CP
Start: 2019-09-21 — End: ?

## 2019-09-21 MED ORDER — PREGABALIN 50 MG CAPSULE
ORAL_CAPSULE | Freq: Three times a day (TID) | ORAL | 2 refills | 30 days | Status: CP
Start: 2019-09-21 — End: 2020-09-20

## 2019-09-21 NOTE — Unmapped (Signed)
1 weeks post op - called to report 10/10 pain from gluteal excision of HS site that sent her to the ER at [facility] last night.  She reports the ER team said the wound looked clean so sent her home after dilaudid injection.      She is currently taking 20mg  Oxycodone every 8 hours.  She has not been able to fill the gabapentin rx due to insurance issues.  She is also taking scheduled tylenol an motrin.      Discussed trial of lyrica if this may avoid insurance issues.  Refilled oxy 10mg  to be used every 3-4 hours. Added flexeril TID to her regimen.      She is to return to clinic this Thursday if symptoms do not improve.       Patient expressed understanding     Rica Koyanagi MD  PGY -5

## 2019-09-27 ENCOUNTER — Ambulatory Visit: Admit: 2019-09-27 | Discharge: 2019-09-28 | Payer: MEDICAID

## 2019-09-27 MED ORDER — GABAPENTIN 300 MG CAPSULE
ORAL_CAPSULE | Freq: Three times a day (TID) | ORAL | 0 refills | 14.00000 days | Status: CP
Start: 2019-09-27 — End: 2019-09-27

## 2019-09-27 MED ORDER — OXYCODONE 10 MG TABLET: 10 mg | tablet | 0 refills | 5 days | Status: AC

## 2019-09-27 MED ORDER — OXYCODONE 10 MG TABLET: 10 mg | tablet | Freq: Four times a day (QID) | 0 refills | 7 days | Status: AC

## 2019-09-27 MED ORDER — OXYCODONE 10 MG TABLET
ORAL_TABLET | ORAL | 0 refills | 5.00000 days | Status: CP | PRN
Start: 2019-09-27 — End: 2019-09-27

## 2019-09-27 MED ORDER — GABAPENTIN 300 MG CAPSULE: 900 mg | capsule | Freq: Three times a day (TID) | 0 refills | 14 days | Status: AC

## 2019-09-27 NOTE — Unmapped (Signed)
Wet to dry drsg to left buttocks. Covered with abd pad secured with tape. Pt tolerated well. Return to clinic in 1 week for f/u.

## 2019-09-27 NOTE — Unmapped (Signed)
PLASTIC SURGERY RETURN PATIENT NOTE    CC: Post op follow up     HPI:  Elizabeth Browning is a 54 y.o. female with past medical history of HS to bilateral buttocks, hurley Stage 3 s/p excision of right buttock HS on 03/09/19 that healed well who is presents today for follow up after left buttock excision on 09/13/2019. Since surgery, the patient called the clinic with repots of intense pain and presented to [facility] ED where she received a dilaudid injection and was then discharged. She was prescribed additional oxycodone and flexeril by Dr. Irena Reichmann. Since this time, she reports continued pain of her right buttock and has been struggling to sit down. She has been compliant with her wet to dry dressing changes.     She denies fevers, chills, or change in drainage from wound      Past Medical History:  Past Medical History:   Diagnosis Date   ??? Anesthesia to pain     difficulty relaxing and letting anesthesia work- anxiety noted   ??? Anxiety    ??? Arthritis    ??? Asthma    ??? Cancer (CMS-HCC)     cervical   ??? Chronic bronchitis (CMS-HCC)    ??? Chronic hepatitis C without hepatic coma (CMS-HCC)     cleared as of 10/25/16 per patient from [facility]   ??? Colon polyp    ??? Difficult intravenous access     requesting site to be numbed   ??? DOE (dyspnea on exertion)    ??? Dysthymia (RAF-HCC)    ??? Fatigue    ??? Foot pain    ??? GERD (gastroesophageal reflux disease) (RAF-HCC)    ??? Hepatitis C    ??? Loosening of knee joint prosthesis (CMS-HCC)     left   ??? Migraine    ??? MRSA (methicillin resistant staph aureus) culture positive     no + culture found- pt denies-current culture negative   ??? Neuropathic pain    ??? URI (upper respiratory infection)    ??? Vitamin D deficiency        Past Surgical History:  Past Surgical History:   Procedure Laterality Date   ??? BUNIONECTOMY Bilateral    ??? CARPOMETACARPEL SUSPENSION PLASTY Bilateral     tendon repair   ??? HAND SURGERY Bilateral     trigger finger   ??? HYSTERECTOMY      partial   ??? JOINT REPLACEMENT Left     knee   ??? KNEE SURGERY Left     revision 8 months later post TKR   ??? MOUTH SURGERY      all teeth removed   ??? PR EXC SWEAT GLAND LESN PERINEAL,SIMPL Right 03/09/2019    Procedure: R21 - EXCISION SKIN/SUBCUTANEOUS TISSUE FOR HIDRADENITIS, PERIANAL, PERINEAL, UMBILICAL; SIMPLE/INTERMEDIATE REPR;  Surgeon: Arsenio Katz, MD;  Location: MAIN OR [facility];  Service: Plastics   ??? PR EXC SWEAT GLAND LESN PERINEAL,SIMPL Left 09/14/2019    Procedure: R24, EXCISION SKIN/SUBCUTANEOUS TISSUE FOR HIDRADENITIS, PERIANAL, PERINEAL, UMBILICAL; SIMPLE/INTERMEDIATE REPR;  Surgeon: Arsenio Katz, MD;  Location: MAIN OR [facility];  Service: Plastics   ??? PR INCIS/DRAIN THIGH/KNEE ABSCESS,DEEP Left 11/11/2016    Procedure: I&D DEEP ABSCESS INFEC BURSA/HEMATOMA THIGH/KNEE;  Surgeon: Lowell Bouton, MD;  Location: OR [facility];  Service: Orthopedics   ??? PR REVISE KNEE JOINT REPLACE,ALL PARTS Left 11/04/2016    Procedure: REVISION LEFT TOTAL KNEE REPLACEMENT, FEMORAL AND TIBIAL COMPARTMENT;  Surgeon: Lowell Bouton, MD;  Location: OR  [facility];  Service: Orthopedics   ??? TOE AMPUTATION Right     pinky    ??? TONSSILLECTOMY     ??? TUBAL LIGATION     ??? UMBILICAL HERNIA REPAIR      age 54       Medications:  Current Outpatient Medications   Medication Sig Dispense Refill   ??? acetaminophen (TYLENOL) 500 MG tablet Take 2 tablets (1,000 mg total) by mouth every eight (8) hours as needed for pain (mild to moderate) for up to 14 days. 84 tablet 0   ??? ADALIMUMAB PEN CITRATE FREE 40 MG/0.4 ML Inject the contents of 2 pens (80mg ) under the skin every 7 days as directed by prescriber 8 each 11   ??? ADVAIR DISKUS 250-50 mcg/dose diskus INHALE 1 PUFF BY MOUTH TWICE DAILY 12 HOURS APART     ??? albuterol HFA 90 mcg/actuation inhaler Inhale 2 puffs every four (4) hours as needed.     ??? cyclobenzaprine (FLEXERIL) 10 MG tablet Take 1 tablet (10 mg total) by mouth Three (3) times a day for 14 days. 42 tablet 0   ??? DEXILANT 60 mg capsule TK 1 C PO QAM FOR HEARTBURN AND ABDOMINAL PAIN     ??? empty container Misc use as directed 1 each 2   ??? gabapentin (NEURONTIN) 300 MG capsule Take 2 capsules (600 mg total) by mouth Three (3) times a day for 14 days. 84 capsule 0   ??? ibuprofen (MOTRIN) 800 MG tablet Take 800 mg by mouth every six (6) hours as needed.     ??? ipratropium-albuteroL (DUO-NEB) 0.5-2.5 mg/3 mL nebulizer USE 1 VIAL VIA NEBULIZER EVERY 4 TO 6 HOURS AS NEEDED FOR COUGH OR WHEEZING     ??? oxyCODONE (ROXICODONE) 10 mg immediate release tablet 1 tab every 3-4 hours 25 tablet 0   ??? polyethylene glycol (MIRALAX) 17 gram packet Dissolve 1 packet (17 g) in 4-8 ounces of liquid and drink by mouth daily as needed (Take while using narcotic pain medication) for up to 14 days. 14 packet 0   ??? pregabalin (LYRICA) 50 MG capsule Take 1 capsule (50 mg total) by mouth Three (3) times a day. 90 capsule 2   ??? traZODone (DESYREL) 50 MG tablet Take 50-100 mg by mouth nightly.       No current facility-administered medications for this visit.        Allergies:  Allergies   Allergen Reactions   ??? Buprenorphine Hcl Hives     Unknown-pt denies   ??? Codeine Rash, Hives and Itching   ??? Adhesive Rash   ??? Iodinated Contrast Media Rash   ??? Latex Rash   ??? Morphine Itching   ??? Opioids - Morphine Analogues Itching     Unknown-pt denies         Family History:   Family History   Problem Relation Age of Onset   ??? Hypertension Mother    ??? Diabetes Mother    ??? Hypertension Father    ??? Cancer Father         Leukemia   ??? Cancer Sister         breast    The patient reports no family history for bleeding disorders or anesthetic problems.    Social History:  Social History     Socioeconomic History   ??? Marital status: Single     Spouse name: Not on file   ??? Number of children: Not on file   ??? Years of  education: Not on file   ??? Highest education level: Not on file   Occupational History   ??? Not on file   Social Needs   ??? Financial resource strain: Not on file   ??? Food insecurity     Worry: Sometimes true     Inability: Sometimes true   ??? Transportation needs     Medical: Yes     Non-medical: Yes   Tobacco Use   ??? Smoking status: Former Smoker     Packs/day: 0.50     Years: 30.00     Pack years: 15.00     Types: Cigarettes   ??? Smokeless tobacco: Never Used   ??? Tobacco comment: started smoking age as a teen   Substance and Sexual Activity   ??? Alcohol use: No   ??? Drug use: No   ??? Sexual activity: Not on file   Lifestyle   ??? Physical activity     Days per week: Not on file     Minutes per session: Not on file   ??? Stress: Not on file   Relationships   ??? Social Wellsite geologist on phone: Not on file     Gets together: Not on file     Attends religious service: Not on file     Active member of club or organization: Not on file     Attends meetings of clubs or organizations: Not on file     Relationship status: Not on file   Other Topics Concern   ??? Exercise Not Asked   ??? Living Situation Not Asked   ??? Do you use sunscreen? No   ??? Tanning bed use? No   ??? Are you easily burned? No   ??? Excessive sun exposure? No   ??? Blistering sunburns? No   Social History Narrative   ??? Not on file       ROS:   Otherwise, 12 point review of systems was completed and is negative except as per HPI.      Vitals:   Vitals:    09/27/19 1116   BP: 110/73   Pulse: 97   Temp: 36.1 ??C (96.9 ??F)   SpO2: 98%     ?  Physical Exam:   Gen: AA in NAD  HEENT: NCAT, EOMI, PERRL, Non icteric sclerae, MMM  CV: RRR  RESP: quiet respirations on room air  EXT: warm and well perfused  BUTTOCKS: R buttock s/p excision well healed. L buttock s/p excision 19 x 9 cm wound with healthy granulation tissue and minimal fibrinous exudate  ?  Impression and Plan:  Quita Skye with history of HS of bilateral buttocks s/p right buttock excision now most recently s/p left buttock excision on 09/13/2019.     - Continue with wet to dry dressings for 1 more week   - Patient is to take Gabapentin 900 mg TID. Prescription ordered today  - Prescription for oxycodone 10 mg every 6 hours ordered today  - Follow up in 1 week      Return in about 1 week (around 10/04/2019).      Scribe's Attestation: Aurther Loft, MD and Lamarr Lulas, MD obtained and performed the history, physical exam and medical decision making elements that were  entered into the chart. Documentation assistance was provided by me personally, a scribe. Signed by Marga Melnick, Scribe, on September 27, 2019 at 11:19 AM.

## 2019-10-01 NOTE — Unmapped (Signed)
[facility] Refill Coordination Note    Specialty Medication(s) to be Shipped:   Inflammatory Disorders: Humira    Other medication(s) to be shipped: n/a     Elizabeth Browning, DOB: September 15, 1965  Phone: [phone] (home)       All above HIPAA information was verified with patient.     Was a Nurse, learning disability used for this call? No    Completed refill call assessment today to schedule patient's medication shipment from the [facility] [phone]).       Specialty medication(s) and dose(s) confirmed: Regimen is correct and unchanged.   Changes to medications: Elizabeth Browning reports no changes at this time.  Changes to insurance: No  Questions for the pharmacist: No    Confirmed patient received Welcome Packet with first shipment. The patient will receive a drug information handout for each medication shipped and additional FDA Medication Guides as required.       DISEASE/MEDICATION-SPECIFIC INFORMATION        For patients on injectable medications: Patient currently has 2 doses left.  Next injection is scheduled for 10/01/2019.    SPECIALTY MEDICATION ADHERENCE     Medication Adherence    Patient reported X missed doses in the last month: 0  Specialty Medication: Humira CF 40 mg/0.4 ml  Patient is on additional specialty medications: No  Any gaps in refill history greater than 2 weeks in the last 3 months: no  Demonstrates understanding of importance of adherence: yes  Informant: patient  Reliability of informant: reliable  Confirmed plan for next specialty medication refill: delivery by pharmacy  Refills needed for supportive medications: not needed                Humira CF 40 mg/0.4 ml . 14 days on hand      SHIPPING     Shipping address confirmed in Epic.     Delivery Scheduled: Yes, Expected medication delivery date: 10/07/2019.     Medication will be delivered via UPS to the prescription address in Epic WAM.    Elizabeth Browning D Kydan Shanholtzer   [facility] Specialty Technician

## 2019-10-04 ENCOUNTER — Ambulatory Visit: Admit: 2019-10-04 | Discharge: 2019-10-05 | Payer: MEDICAID

## 2019-10-04 MED ORDER — OXYCODONE 10 MG TABLET
ORAL_TABLET | Freq: Four times a day (QID) | ORAL | 0 refills | 4 days | Status: CP | PRN
Start: 2019-10-04 — End: ?

## 2019-10-06 MED FILL — HUMIRA PEN CITRATE FREE 40 MG/0.4 ML: 28 days supply | Qty: 8 | Fill #7

## 2019-10-06 MED FILL — HUMIRA PEN CITRATE FREE 40 MG/0.4 ML: 28 days supply | Qty: 8 | Fill #7 | Status: AC

## 2019-10-11 NOTE — Unmapped (Signed)
CC:     HPI:  Elizabeth Browning is a 54 y.o. female with past medical history of HS to bilateral buttocks, hurley Stage 3 s/p excision of right buttock HS on 03/09/19 that healed well who is presents today for follow up after left buttock excision on 09/13/2019. Since surgery, the patient called the clinic with repots of intense pain and presented to [facility] ED where she received a dilaudid injection and was then discharged. She was prescribed additional oxycodone and flexeril by Dr. Irena Reichmann. Since this time, she reports continued pain of her right buttock and has been struggling to sit down. She has been compliant with her wet to dry dressing changes.   ??  She denies fevers, chills, or change in drainage from wound.    ??  Past Medical History:  Past Medical History:   Diagnosis Date   ??? Anesthesia to pain     difficulty relaxing and letting anesthesia work- anxiety noted   ??? Anxiety    ??? Arthritis    ??? Asthma    ??? Cancer (CMS-HCC)     cervical   ??? Chronic bronchitis (CMS-HCC)    ??? Chronic hepatitis C without hepatic coma (CMS-HCC)     cleared as of 10/25/16 per patient from [facility]   ??? Colon polyp    ??? Difficult intravenous access     requesting site to be numbed   ??? DOE (dyspnea on exertion)    ??? Dysthymia (RAF-HCC)    ??? Fatigue    ??? Foot pain    ??? GERD (gastroesophageal reflux disease) (RAF-HCC)    ??? Hepatitis C    ??? Loosening of knee joint prosthesis (CMS-HCC)     left   ??? Migraine    ??? MRSA (methicillin resistant staph aureus) culture positive     no + culture found- pt denies-current culture negative   ??? Neuropathic pain    ??? URI (upper respiratory infection)    ??? Vitamin D deficiency        Past Surgical History:  Past Surgical History:   Procedure Laterality Date   ??? BUNIONECTOMY Bilateral    ??? CARPOMETACARPEL SUSPENSION PLASTY Bilateral     tendon repair   ??? HAND SURGERY Bilateral     trigger finger   ??? HYSTERECTOMY      partial   ??? JOINT REPLACEMENT Left     knee   ??? KNEE SURGERY Left     revision 8 months later post TKR   ??? MOUTH SURGERY      all teeth removed   ??? PR EXC SWEAT GLAND LESN PERINEAL,SIMPL Right 03/09/2019    Procedure: R21 - EXCISION SKIN/SUBCUTANEOUS TISSUE FOR HIDRADENITIS, PERIANAL, PERINEAL, UMBILICAL; SIMPLE/INTERMEDIATE REPR;  Surgeon: Arsenio Katz, MD;  Location: MAIN OR [facility];  Service: Plastics   ??? PR EXC SWEAT GLAND LESN PERINEAL,SIMPL Left 09/14/2019    Procedure: R24, EXCISION SKIN/SUBCUTANEOUS TISSUE FOR HIDRADENITIS, PERIANAL, PERINEAL, UMBILICAL; SIMPLE/INTERMEDIATE REPR;  Surgeon: Arsenio Katz, MD;  Location: MAIN OR [facility];  Service: Plastics   ??? PR INCIS/DRAIN THIGH/KNEE ABSCESS,DEEP Left 11/11/2016    Procedure: I&D DEEP ABSCESS INFEC BURSA/HEMATOMA THIGH/KNEE;  Surgeon: Lowell Bouton, MD;  Location: OR [facility];  Service: Orthopedics   ??? PR REVISE KNEE JOINT REPLACE,ALL PARTS Left 11/04/2016    Procedure: REVISION LEFT TOTAL KNEE REPLACEMENT, FEMORAL AND TIBIAL COMPARTMENT;  Surgeon: Lowell Bouton, MD;  Location: OR [facility];  Service: Orthopedics   ??? TOE AMPUTATION Right  pinky    ??? TONSSILLECTOMY     ??? TUBAL LIGATION     ??? UMBILICAL HERNIA REPAIR      age 55       Medications:  Current Outpatient Medications   Medication Sig Dispense Refill   ??? ADALIMUMAB PEN CITRATE FREE 40 MG/0.4 ML Inject the contents of 2 pens (80mg ) under the skin every 7 days as directed by prescriber 8 each 11   ??? ADVAIR DISKUS 250-50 mcg/dose diskus INHALE 1 PUFF BY MOUTH TWICE DAILY 12 HOURS APART     ??? albuterol HFA 90 mcg/actuation inhaler Inhale 2 puffs every four (4) hours as needed.     ??? DEXILANT 60 mg capsule TK 1 C PO QAM FOR HEARTBURN AND ABDOMINAL PAIN     ??? empty container Misc use as directed 1 each 2   ??? gabapentin (NEURONTIN) 300 MG capsule Take 3 capsules (900 mg total) by mouth Three (3) times a day for 14 days. 126 capsule 0   ??? ibuprofen (MOTRIN) 800 MG tablet Take 800 mg by mouth every six (6) hours as needed.     ??? ipratropium-albuteroL (DUO-NEB) 0.5-2.5 mg/3 mL nebulizer USE 1 VIAL VIA NEBULIZER EVERY 4 TO 6 HOURS AS NEEDED FOR COUGH OR WHEEZING     ??? oxyCODONE (ROXICODONE) 10 mg immediate release tablet Take 1 tablet (10 mg total) by mouth every six (6) hours as needed for pain. 14 tablet 0   ??? pregabalin (LYRICA) 50 MG capsule Take 1 capsule (50 mg total) by mouth Three (3) times a day. 90 capsule 2   ??? traZODone (DESYREL) 50 MG tablet Take 50-100 mg by mouth nightly.       No current facility-administered medications for this visit.        Allergies:  Allergies   Allergen Reactions   ??? Buprenorphine Hcl Hives     Unknown-pt denies   ??? Codeine Rash, Hives and Itching   ??? Adhesive Rash   ??? Iodinated Contrast Media Rash   ??? Latex Rash   ??? Morphine Itching   ??? Opioids - Morphine Analogues Itching     Unknown-pt denies         Family History:   Family History   Problem Relation Age of Onset   ??? Hypertension Mother    ??? Diabetes Mother    ??? Hypertension Father    ??? Cancer Father         Leukemia   ??? Cancer Sister         breast    The patient reports no family history for bleeding disorders or anesthetic problems.    Social History:  Social History     Socioeconomic History   ??? Marital status: Single     Spouse name: Not on file   ??? Number of children: Not on file   ??? Years of education: Not on file   ??? Highest education level: Not on file   Occupational History   ??? Not on file   Social Needs   ??? Financial resource strain: Not on file   ??? Food insecurity     Worry: Sometimes true     Inability: Sometimes true   ??? Transportation needs     Medical: Yes     Non-medical: Yes   Tobacco Use   ??? Smoking status: Former Smoker     Packs/day: 0.50     Years: 30.00     Pack years: 15.00  Types: Cigarettes   ??? Smokeless tobacco: Never Used   ??? Tobacco comment: started smoking age as a teen   Substance and Sexual Activity   ??? Alcohol use: No   ??? Drug use: No   ??? Sexual activity: Not on file   Lifestyle   ??? Physical activity     Days per week: Not on file     Minutes per session: Not on file   ??? Stress: Not on file   Relationships   ??? Social Wellsite geologist on phone: Not on file     Gets together: Not on file     Attends religious service: Not on file     Active member of club or organization: Not on file     Attends meetings of clubs or organizations: Not on file     Relationship status: Not on file   Other Topics Concern   ??? Exercise Not Asked   ??? Living Situation Not Asked   ??? Do you use sunscreen? No   ??? Tanning bed use? No   ??? Are you easily burned? No   ??? Excessive sun exposure? No   ??? Blistering sunburns? No   Social History Narrative   ??? Not on file       ROS:   Otherwise, 12 point review of systems was completed and is negative except as per HPI.      Vitals:   Vitals:    10/04/19 1407   BP: 119/84   Pulse: 102   Temp: 36.2 ??C (97.2 ??F)   SpO2: 99%     ?  Physical Exam:   Gen: AA in NAD  HEENT: NCAT, EOMI, PERRL, Non icteric sclerae, MMM  CV: RRR  RESP: quiet respirations on room air  EXT: warm and well perfused  BUTTOCKS: R buttock s/p excision well healed. L buttock wound with healthy granulation tissue.  NO drainage or erythema  ?  Impression and Plan:  Quita Skye with history of HS of bilateral buttocks s/p right buttock excision now most recently s/p left buttock excision on 09/13/2019.   ??-May transition to xeroform daily  -RTC 2 weeks  -One 1/2 fill on the narcotic pain medication        No follow-ups on file.

## 2019-10-25 ENCOUNTER — Ambulatory Visit: Admit: 2019-10-25 | Discharge: 2019-10-26 | Payer: MEDICAID

## 2019-10-25 DIAGNOSIS — R52 Pain, unspecified: Principal | ICD-10-CM

## 2019-10-25 DIAGNOSIS — L732 Hidradenitis suppurativa: Principal | ICD-10-CM

## 2019-10-27 NOTE — Unmapped (Signed)
CC:     HPI:  Elizabeth Browningis a 54 y.o.??female??with past medical history of HS to bilateral buttocks, hurley Stage 3 s/p excision of right buttock HS on 03/09/19 that healed well??who is presents today for follow up after left buttock excision on 09/13/2019.??She has noted some pain that is persistent since surgery.  She has been tolerating dressing changes.  No drainage.        Past Medical History:  Past Medical History:   Diagnosis Date   ??? Anesthesia to pain     difficulty relaxing and letting anesthesia work- anxiety noted   ??? Anxiety    ??? Arthritis    ??? Asthma    ??? Cancer (CMS-HCC)     cervical   ??? Chronic bronchitis (CMS-HCC)    ??? Chronic hepatitis C without hepatic coma (CMS-HCC)     cleared as of 10/25/16 per patient from [facility]   ??? Colon polyp    ??? Difficult intravenous access     requesting site to be numbed   ??? DOE (dyspnea on exertion)    ??? Dysthymia (RAF-HCC)    ??? Fatigue    ??? Foot pain    ??? GERD (gastroesophageal reflux disease) (RAF-HCC)    ??? Hepatitis C    ??? Loosening of knee joint prosthesis (CMS-HCC)     left   ??? Migraine    ??? MRSA (methicillin resistant staph aureus) culture positive     no + culture found- pt denies-current culture negative   ??? Neuropathic pain    ??? URI (upper respiratory infection)    ??? Vitamin D deficiency        Past Surgical History:  Past Surgical History:   Procedure Laterality Date   ??? BUNIONECTOMY Bilateral    ??? CARPOMETACARPEL SUSPENSION PLASTY Bilateral     tendon repair   ??? HAND SURGERY Bilateral     trigger finger   ??? HYSTERECTOMY      partial   ??? JOINT REPLACEMENT Left     knee   ??? KNEE SURGERY Left     revision 8 months later post TKR   ??? MOUTH SURGERY      all teeth removed   ??? PR EXC SWEAT GLAND LESN PERINEAL,SIMPL Right 03/09/2019    Procedure: R21 - EXCISION SKIN/SUBCUTANEOUS TISSUE FOR HIDRADENITIS, PERIANAL, PERINEAL, UMBILICAL; SIMPLE/INTERMEDIATE REPR;  Surgeon: Arsenio Katz, MD;  Location: MAIN OR [facility];  Service: Plastics   ??? PR EXC SWEAT GLAND LESN PERINEAL,SIMPL Left 09/14/2019    Procedure: R24, EXCISION SKIN/SUBCUTANEOUS TISSUE FOR HIDRADENITIS, PERIANAL, PERINEAL, UMBILICAL; SIMPLE/INTERMEDIATE REPR;  Surgeon: Arsenio Katz, MD;  Location: MAIN OR [facility];  Service: Plastics   ??? PR INCIS/DRAIN THIGH/KNEE ABSCESS,DEEP Left 11/11/2016    Procedure: I&D DEEP ABSCESS INFEC BURSA/HEMATOMA THIGH/KNEE;  Surgeon: Lowell Bouton, MD;  Location: OR [facility];  Service: Orthopedics   ??? PR REVISE KNEE JOINT REPLACE,ALL PARTS Left 11/04/2016    Procedure: REVISION LEFT TOTAL KNEE REPLACEMENT, FEMORAL AND TIBIAL COMPARTMENT;  Surgeon: Lowell Bouton, MD;  Location: OR [facility];  Service: Orthopedics   ??? TOE AMPUTATION Right     pinky    ??? TONSSILLECTOMY     ??? TUBAL LIGATION     ??? UMBILICAL HERNIA REPAIR      age 64       Medications:  Current Outpatient Medications   Medication Sig Dispense Refill   ??? ADALIMUMAB PEN CITRATE FREE 40 MG/0.4 ML Inject the contents of 2 pens (  80mg ) under the skin every 7 days as directed by prescriber 8 each 11   ??? ADVAIR DISKUS 250-50 mcg/dose diskus INHALE 1 PUFF BY MOUTH TWICE DAILY 12 HOURS APART     ??? albuterol HFA 90 mcg/actuation inhaler Inhale 2 puffs every four (4) hours as needed.     ??? DEXILANT 60 mg capsule TK 1 C PO QAM FOR HEARTBURN AND ABDOMINAL PAIN     ??? empty container Misc use as directed 1 each 2   ??? ibuprofen (MOTRIN) 800 MG tablet Take 800 mg by mouth every six (6) hours as needed.     ??? ipratropium-albuteroL (DUO-NEB) 0.5-2.5 mg/3 mL nebulizer USE 1 VIAL VIA NEBULIZER EVERY 4 TO 6 HOURS AS NEEDED FOR COUGH OR WHEEZING     ??? pregabalin (LYRICA) 50 MG capsule Take 1 capsule (50 mg total) by mouth Three (3) times a day. 90 capsule 2   ??? gabapentin (NEURONTIN) 300 MG capsule Take 3 capsules (900 mg total) by mouth Three (3) times a day for 14 days. 126 capsule 0   ??? oxyCODONE (ROXICODONE) 10 mg immediate release tablet Take 1 tablet (10 mg total) by mouth every six (6) hours as needed for pain. (Patient not taking: Reported on 10/25/2019) 14 tablet 0   ??? traZODone (DESYREL) 50 MG tablet Take 50-100 mg by mouth nightly. (Patient not taking: Reported on 10/25/2019)       No current facility-administered medications for this visit.       Allergies:  Allergies   Allergen Reactions   ??? Buprenorphine Hcl Hives     Unknown-pt denies   ??? Codeine Rash, Hives and Itching   ??? Adhesive Rash   ??? Iodinated Contrast Media Rash   ??? Latex Rash   ??? Morphine Itching   ??? Opioids - Morphine Analogues Itching     Unknown-pt denies         Family History:   Family History   Problem Relation Age of Onset   ??? Hypertension Mother    ??? Diabetes Mother    ??? Hypertension Father    ??? Cancer Father         Leukemia   ??? Cancer Sister         breast    The patient reports no family history for bleeding disorders or anesthetic problems.    Social History:  Social History     Socioeconomic History   ??? Marital status: Single     Spouse name: Not on file   ??? Number of children: Not on file   ??? Years of education: Not on file   ??? Highest education level: Not on file   Occupational History   ??? Not on file   Tobacco Use   ??? Smoking status: Former Smoker     Packs/day: 0.50     Years: 30.00     Pack years: 15.00     Types: Cigarettes   ??? Smokeless tobacco: Never Used   ??? Tobacco comment: started smoking age as a teen   Substance and Sexual Activity   ??? Alcohol use: No   ??? Drug use: No   ??? Sexual activity: Not on file   Other Topics Concern   ??? Exercise Not Asked   ??? Living Situation Not Asked   ??? Do you use sunscreen? No   ??? Tanning bed use? No   ??? Are you easily burned? No   ??? Excessive sun exposure? No   ??? Blistering  sunburns? No   Social History Narrative   ??? Not on file     Social Determinants of Health     Financial Resource Strain:    ??? Difficulty of Paying Living Expenses:    Food Insecurity: Food Insecurity Present   ??? Worried About Programme researcher, broadcasting/film/video in the Last Year: Sometimes true   ??? Ran Out of Food in the Last Year: Sometimes true Transportation Needs: Unmet Transportation Needs   ??? Lack of Transportation (Medical): Yes   ??? Lack of Transportation (Non-Medical): Yes   Physical Activity:    ??? Days of Exercise per Week:    ??? Minutes of Exercise per Session:    Stress:    ??? Feeling of Stress :    Social Connections:    ??? Frequency of Communication with Friends and Family:    ??? Frequency of Social Gatherings with Friends and Family:    ??? Attends Religious Services:    ??? Database administrator or Organizations:    ??? Attends Engineer, structural:    ??? Marital Status:        ROS:   Otherwise, 12 point review of systems was completed and is negative except as per HPI.      Vitals:   Vitals:    10/25/19 1049   BP: 113/77   Pulse: 102   Temp: 36.1 ??C (97 ??F)   SpO2: 96%     ?  Physical Exam:   Gen: AA in NAD  HEENT: NCAT, EOMI, PERRL, Non icteric sclerae, MMM  CV: RRR  RESP: quiet respirations on room air  EXT: warm and well perfused  BUTTOCKS: R buttock s/p excision well healed. L buttock wound with healthy granulation tissue.  No drainage or erythema.  Additional 1cm of healthy neoepithelialization at the periphery  ?  Impression and Plan:  Elizabeth Browningwith history of HS of bilateral buttocks s/p right buttock excision now most recently s/p left buttock excision on 09/13/2019.   -xeroform daily  -RTC 2 weeks  -pain consult         No follow-ups on file.

## 2019-10-28 NOTE — Unmapped (Signed)
[facility] Pharm[facility] Refill Coordination Note    Specialty Medication(s) to be Shipped:   Inflammatory Disorders: Humira    Other medication(s) to be shipped: n/a     Quita Skye, DOB: 1965/10/10  Phone: [phone] (home)       All above HIPAA information was verified with patient.     Was a Nurse, learning disability used for this call? No    Completed refill call assessment today to schedule patient's medication shipment from the [facility] [phone]).       Specialty medication(s) and dose(s) confirmed: Regimen is correct and unchanged.   Changes to medications: Alinda reports no changes at this time.  Changes to insurance: No  Questions for the pharmacist: No    Confirmed patient received Welcome Packet with first shipment. The patient will receive a drug information handout for each medication shipped and additional FDA Medication Guides as required.       DISEASE/MEDICATION-SPECIFIC INFORMATION        For patients on injectable medications: Patient currently has 1 doses left.  Next injection is scheduled for 10/29/2019.    SPECIALTY MEDICATION ADHERENCE     Medication Adherence    Patient reported X missed doses in the last month: 0  Specialty Medication: Humira CF 40 mg/0.4 ml   Patient is on additional specialty medications: No  Any gaps in refill history greater than 2 weeks in the last 3 months: no  Demonstrates understanding of importance of adherence: yes  Informant: patient  Reliability of informant: reliable  Confirmed plan for next specialty medication refill: delivery by pharmacy  Refills needed for supportive medications: not needed                Humira CF 40 mg/0.4 ml . 7 days on hand      SHIPPING     Shipping address confirmed in Epic.     Delivery Scheduled: Yes, Expected medication delivery date: 11/03/2019.     Medication will be delivered via UPS to the prescription address in Epic WAM.    Ladean Steinmeyer D Aury Scollard   [facility] Specialty Technician

## 2019-11-02 MED FILL — HUMIRA PEN CITRATE FREE 40 MG/0.4 ML: 28 days supply | Qty: 8 | Fill #8 | Status: AC

## 2019-11-02 MED FILL — HUMIRA PEN CITRATE FREE 40 MG/0.4 ML: 28 days supply | Qty: 8 | Fill #8

## 2019-11-08 ENCOUNTER — Ambulatory Visit: Admit: 2019-11-08 | Discharge: 2019-11-09 | Payer: MEDICAID

## 2019-11-08 DIAGNOSIS — L732 Hidradenitis suppurativa: Principal | ICD-10-CM

## 2019-11-08 NOTE — Unmapped (Signed)
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    She notes no pain at this time.  She feels it was present for 3-4 weeks post op, but has resolved now.  She states that she has some concerns that she has been on humira and still gets boils.        Past Medical History:  Past Medical History:   Diagnosis Date   ??? Anesthesia to pain     difficulty relaxing and letting anesthesia work- anxiety noted   ??? Anxiety    ??? Arthritis    ??? Asthma    ??? Cancer (CMS-HCC)     cervical   ??? Chronic bronchitis (CMS-HCC)    ??? Chronic hepatitis C without hepatic coma (CMS-HCC)     cleared as of 10/25/16 per patient from [facility]   ??? Colon polyp    ??? Difficult intravenous access     requesting site to be numbed   ??? DOE (dyspnea on exertion)    ??? Dysthymia (RAF-HCC)    ??? Fatigue    ??? Foot pain    ??? GERD (gastroesophageal reflux disease) (RAF-HCC)    ??? Hepatitis C    ??? Loosening of knee joint prosthesis (CMS-HCC)     left   ??? Migraine    ??? MRSA (methicillin resistant staph aureus) culture positive     no + culture found- pt denies-current culture negative   ??? Neuropathic pain    ??? URI (upper respiratory infection)    ??? Vitamin D deficiency        Past Surgical History:  Past Surgical History:   Procedure Laterality Date   ??? BUNIONECTOMY Bilateral    ??? CARPOMETACARPEL SUSPENSION PLASTY Bilateral     tendon repair   ??? HAND SURGERY Bilateral     trigger finger   ??? HYSTERECTOMY      partial   ??? JOINT REPLACEMENT Left     knee   ??? KNEE SURGERY Left     revision 8 months later post TKR   ??? MOUTH SURGERY      all teeth removed   ??? PR EXC SWEAT GLAND LESN PERINEAL,SIMPL Right 03/09/2019    Procedure: R21 - EXCISION SKIN/SUBCUTANEOUS TISSUE FOR HIDRADENITIS, PERIANAL, PERINEAL, UMBILICAL; SIMPLE/INTERMEDIATE REPR;  Surgeon: Arsenio Katz, MD;  Location: MAIN OR [facility];  Service: Plastics   ??? PR EXC SWEAT GLAND LESN PERINEAL,SIMPL Left 09/14/2019    Procedure: R24, EXCISION SKIN/SUBCUTANEOUS TISSUE FOR HIDRADENITIS, PERIANAL, PERINEAL, UMBILICAL; SIMPLE/INTERMEDIATE REPR;  Surgeon: Arsenio Katz, MD;  Location: MAIN OR [facility];  Service: Plastics   ??? PR INCIS/DRAIN THIGH/KNEE ABSCESS,DEEP Left 11/11/2016    Procedure: I&D DEEP ABSCESS INFEC BURSA/HEMATOMA THIGH/KNEE;  Surgeon: Lowell Bouton, MD;  Location: OR [facility];  Service: Orthopedics   ??? PR REVISE KNEE JOINT REPLACE,ALL PARTS Left 11/04/2016    Procedure: REVISION LEFT TOTAL KNEE REPLACEMENT, FEMORAL AND TIBIAL COMPARTMENT;  Surgeon: Lowell Bouton, MD;  Location: OR [facility];  Service: Orthopedics   ??? TOE AMPUTATION Right     pinky    ??? TONSSILLECTOMY     ??? TUBAL LIGATION     ??? UMBILICAL HERNIA REPAIR      age 53       Medications:  Current  Outpatient Medications   Medication Sig Dispense Refill   ??? ADALIMUMAB PEN CITRATE FREE 40 MG/0.4 ML Inject the contents of 2 pens (80mg ) under the skin every 7 days as directed by prescriber 8 each 11   ??? ADVAIR DISKUS 250-50 mcg/dose diskus INHALE 1 PUFF BY MOUTH TWICE DAILY 12 HOURS APART     ??? albuterol HFA 90 mcg/actuation inhaler Inhale 2 puffs every four (4) hours as needed.     ??? DEXILANT 60 mg capsule TK 1 C PO QAM FOR HEARTBURN AND ABDOMINAL PAIN     ??? empty container Misc use as directed 1 each 2   ??? ipratropium-albuteroL (DUO-NEB) 0.5-2.5 mg/3 mL nebulizer USE 1 VIAL VIA NEBULIZER EVERY 4 TO 6 HOURS AS NEEDED FOR COUGH OR WHEEZING     ??? pregabalin (LYRICA) 50 MG capsule Take 1 capsule (50 mg total) by mouth Three (3) times a day. 90 capsule 2   ??? gabapentin (NEURONTIN) 300 MG capsule Take 3 capsules (900 mg total) by mouth Three (3) times a day for 14 days. 126 capsule 0   ??? ibuprofen (MOTRIN) 800 MG tablet Take 800 mg by mouth every six (6) hours as needed. (Patient not taking: Reported on 11/08/2019)     ??? oxyCODONE (ROXICODONE) 10 mg immediate release tablet Take 1 tablet (10 mg total) by mouth every six (6) hours as needed for pain. (Patient not taking: Reported on 10/25/2019) 14 tablet 0   ??? traZODone (DESYREL) 50 MG tablet Take 50-100 mg by mouth nightly. (Patient not taking: Reported on 10/25/2019)       No current facility-administered medications for this visit.       Allergies:  Allergies   Allergen Reactions   ??? Buprenorphine Hcl Hives     Unknown-pt denies   ??? Codeine Rash, Hives and Itching   ??? Adhesive Rash   ??? Iodinated Contrast Media Rash   ??? Latex Rash   ??? Morphine Itching   ??? Opioids - Morphine Analogues Itching     Unknown-pt denies         Family History:   Family History   Problem Relation Age of Onset   ??? Hypertension Mother    ??? Diabetes Mother    ??? Hypertension Father    ??? Cancer Father         Leukemia   ??? Cancer Sister         breast    The patient reports no family history for bleeding disorders or anesthetic problems.    Social History:  Social History     Socioeconomic History   ??? Marital status: Single     Spouse name: Not on file   ??? Number of children: Not on file   ??? Years of education: Not on file   ??? Highest education level: Not on file   Occupational History   ??? Not on file   Tobacco Use   ??? Smoking status: Former Smoker     Packs/day: 0.50     Years: 30.00     Pack years: 15.00     Types: Cigarettes   ??? Smokeless tobacco: Never Used   ??? Tobacco comment: started smoking age as a teen   Substance and Sexual Activity   ??? Alcohol use: No   ??? Drug use: No   ??? Sexual activity: Not on file   Other Topics Concern   ??? Exercise Not Asked   ??? Living Situation Not Asked   ??? Do  you use sunscreen? No   ??? Tanning bed use? No   ??? Are you easily burned? No   ??? Excessive sun exposure? No   ??? Blistering sunburns? No   Social History Narrative   ??? Not on file     Social Determinants of Health     Financial Resource Strain:    ??? Difficulty of Paying Living Expenses:    Food Insecurity: Food Insecurity Present   ??? Worried About Programme researcher, broadcasting/film/video in the Last Year: Sometimes true   ??? Ran Out of Food in the Last Year: Sometimes true   Transportation Needs: Unmet Transportation Needs   ??? Lack of Transportation (Medical): Yes   ??? Lack of Transportation (Non-Medical): Yes   Physical Activity:    ??? Days of Exercise per Week:    ??? Minutes of Exercise per Session:    Stress:    ??? Feeling of Stress :    Social Connections:    ??? Frequency of Communication with Friends and Family:    ??? Frequency of Social Gatherings with Friends and Family:    ??? Attends Religious Services:    ??? Database administrator or Organizations:    ??? Attends Engineer, structural:    ??? Marital Status:        ROS:   Otherwise, 12 point review of systems was completed and is negative except as per HPI.      Vitals:   Vitals:    11/08/19 0808   BP: 120/74   Pulse: 77   Resp: 18   Temp: 35.9 ??C (96.7 ??F)   SpO2: 98%     ?  Physical Exam:   Gen: AA in NAD  HEENT: NCAT, EOMI, PERRL, Non icteric sclerae, MMM  CV: RRR  RESP: quiet respirations on room air  EXT: warm and well perfused  BUTTOCKS: R buttock s/p excision well healed. L buttock wound with healthy granulation tissue. ??No drainage or erythema.  Additional 1cm of healthy neoepithelialization at the periphery    ?  Impression and Plan:  Elizabeth Browning??with history of HS of bilateral buttocks s/p right buttock excision now most recently s/p left buttock excision on 09/13/2019.   -xeroform daily  -RTC 4 weeks          No follow-ups on file.

## 2019-11-25 NOTE — Unmapped (Signed)
[facility] Pharm[facility] Refill Coordination Note    Specialty Medication(s) to be Shipped:   Inflammatory Disorders: Humira    Other medication(s) to be shipped: n/a     Elizabeth Browning, DOB: 19-Jun-1966  Phone: [phone] (home)       All above HIPAA information was verified with patient.     Was a Nurse, learning disability used for this call? No    Completed refill call assessment today to schedule patient's medication shipment from the [facility] [phone]).       Specialty medication(s) and dose(s) confirmed: Regimen is correct and unchanged.   Changes to medications: Elizabeth Browning reports no changes at this time.  Changes to insurance: No  Questions for the pharmacist: No    Confirmed patient received Welcome Packet with first shipment. The patient will receive a drug information handout for each medication shipped and additional FDA Medication Guides as required.       DISEASE/MEDICATION-SPECIFIC INFORMATION        For patients on injectable medications: Patient currently has 1 doses left.  Next injection is scheduled for 11/26/19.    SPECIALTY MEDICATION ADHERENCE     Medication Adherence    Patient reported X missed doses in the last month: 0  Specialty Medication: Humira CF 40 mg/0.4 ml   Patient is on additional specialty medications: No  Informant: patient                      SHIPPING     Shipping address confirmed in Epic.     Delivery Scheduled: Yes, Expected medication delivery date: 11/30/19.     Medication will be delivered via UPS to the prescription address in Epic WAM.    Jasper Loser   [facility] Specialty Technician

## 2019-11-29 MED FILL — HUMIRA PEN CITRATE FREE 40 MG/0.4 ML: 28 days supply | Qty: 8 | Fill #9

## 2019-11-29 MED FILL — HUMIRA PEN CITRATE FREE 40 MG/0.4 ML: 28 days supply | Qty: 8 | Fill #9 | Status: AC

## 2019-12-04 MED ORDER — ATORVASTATIN 40 MG TABLET
Freq: Every day | ORAL | 0 days
Start: 2019-12-04 — End: ?

## 2019-12-06 ENCOUNTER — Ambulatory Visit: Admit: 2019-12-06 | Discharge: 2019-12-07 | Payer: MEDICAID

## 2019-12-06 NOTE — Unmapped (Signed)
Xeroform covered with abd pad secured with tape to buttocks wound. Tolerated well.

## 2019-12-08 NOTE — Unmapped (Signed)
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  ??  She notes no pain at this time.  She is healing well and notes that she is interested in discussing other areas for excision, starting with her breasts.  These are large in volume, the sweating from skin contact makes the flares worse.  She utilized meticulous hygiene, but despite this the areas still drain.  She is interested in eradicating the disease, so has no goals for breast size, only whatever is necessary to decrease the burden of disease.      Past Medical History:  Past Medical History:   Diagnosis Date   ??? Anesthesia to pain     difficulty relaxing and letting anesthesia work- anxiety noted   ??? Anxiety    ??? Arthritis    ??? Asthma    ??? Cancer (CMS-HCC)     cervical   ??? Chronic bronchitis (CMS-HCC)    ??? Chronic hepatitis C without hepatic coma (CMS-HCC)     cleared as of 10/25/16 per patient from [facility]   ??? Colon polyp    ??? Difficult intravenous access     requesting site to be numbed   ??? DOE (dyspnea on exertion)    ??? Dysthymia (RAF-HCC)    ??? Fatigue    ??? Foot pain    ??? GERD (gastroesophageal reflux disease) (RAF-HCC)    ??? Hepatitis C    ??? Loosening of knee joint prosthesis (CMS-HCC)     left   ??? Migraine    ??? MRSA (methicillin resistant staph aureus) culture positive     no + culture found- pt denies-current culture negative   ??? Neuropathic pain    ??? URI (upper respiratory infection)    ??? Vitamin D deficiency        Past Surgical History:  Past Surgical History:   Procedure Laterality Date   ??? BUNIONECTOMY Bilateral    ??? CARPOMETACARPEL SUSPENSION PLASTY Bilateral     tendon repair   ??? HAND SURGERY Bilateral     trigger finger   ??? HYSTERECTOMY      partial   ??? JOINT REPLACEMENT Left     knee   ??? KNEE SURGERY Left     revision 8 months later post TKR   ??? MOUTH SURGERY      all teeth removed   ??? PR EXC SWEAT GLAND LESN PERINEAL,SIMPL Right 03/09/2019    Procedure: R21 - EXCISION SKIN/SUBCUTANEOUS TISSUE FOR HIDRADENITIS, PERIANAL, PERINEAL, UMBILICAL; SIMPLE/INTERMEDIATE REPR;  Surgeon: Arsenio Katz, MD;  Location: MAIN OR [facility];  Service: Plastics   ??? PR EXC SWEAT GLAND LESN PERINEAL,SIMPL Left 09/14/2019    Procedure: R24, EXCISION SKIN/SUBCUTANEOUS TISSUE FOR HIDRADENITIS, PERIANAL, PERINEAL, UMBILICAL; SIMPLE/INTERMEDIATE REPR;  Surgeon: Arsenio Katz, MD;  Location: MAIN OR [facility];  Service: Plastics   ??? PR INCIS/DRAIN THIGH/KNEE ABSCESS,DEEP Left 11/11/2016    Procedure: I&D DEEP ABSCESS INFEC BURSA/HEMATOMA THIGH/KNEE;  Surgeon: Lowell Bouton, MD;  Location: OR [facility];  Service: Orthopedics   ??? PR REVISE KNEE JOINT REPLACE,ALL PARTS Left 11/04/2016    Procedure: REVISION LEFT TOTAL KNEE REPLACEMENT, FEMORAL AND TIBIAL COMPARTMENT;  Surgeon: Lowell Bouton, MD;  Location: OR [facility];  Service: Orthopedics   ??? TOE AMPUTATION Right  pinky    ??? TONSSILLECTOMY     ??? TUBAL LIGATION     ??? UMBILICAL HERNIA REPAIR      age 51       Medications:  Current Outpatient Medications   Medication Sig Dispense Refill   ??? ADALIMUMAB PEN CITRATE FREE 40 MG/0.4 ML Inject the contents of 2 pens (80mg ) under the skin every 7 days as directed by prescriber 8 each 11   ??? ADVAIR DISKUS 250-50 mcg/dose diskus INHALE 1 PUFF BY MOUTH TWICE DAILY 12 HOURS APART     ??? albuterol HFA 90 mcg/actuation inhaler Inhale 2 puffs every four (4) hours as needed.     ??? DEXILANT 60 mg capsule TK 1 C PO QAM FOR HEARTBURN AND ABDOMINAL PAIN     ??? empty container Misc use as directed 1 each 2   ??? ipratropium-albuteroL (DUO-NEB) 0.5-2.5 mg/3 mL nebulizer USE 1 VIAL VIA NEBULIZER EVERY 4 TO 6 HOURS AS NEEDED FOR COUGH OR WHEEZING     ??? pregabalin (LYRICA) 50 MG capsule Take 1 capsule (50 mg total) by mouth Three (3) times a day. 90 capsule 2   ??? gabapentin (NEURONTIN) 300 MG capsule Take 3 capsules (900 mg total) by mouth Three (3) times a day for 14 days. 126 capsule 0   ??? ibuprofen (MOTRIN) 800 MG tablet Take 800 mg by mouth every six (6) hours as needed. (Patient not taking: Reported on 11/08/2019)     ??? oxyCODONE (ROXICODONE) 10 mg immediate release tablet Take 1 tablet (10 mg total) by mouth every six (6) hours as needed for pain. (Patient not taking: Reported on 10/25/2019) 14 tablet 0   ??? traZODone (DESYREL) 50 MG tablet Take 50-100 mg by mouth nightly. (Patient not taking: Reported on 10/25/2019)       No current facility-administered medications for this visit.       Allergies:  Allergies   Allergen Reactions   ??? Buprenorphine Hcl Hives     Unknown-pt denies   ??? Codeine Rash, Hives and Itching   ??? Adhesive Rash   ??? Iodinated Contrast Media Rash   ??? Latex Rash   ??? Morphine Itching   ??? Opioids - Morphine Analogues Itching     Unknown-pt denies         Family History:   Family History   Problem Relation Age of Onset   ??? Hypertension Mother    ??? Diabetes Mother    ??? Hypertension Father    ??? Cancer Father         Leukemia   ??? Cancer Sister         breast    The patient reports no family history for bleeding disorders or anesthetic problems.    Social History:  Social History     Socioeconomic History   ??? Marital status: Single     Spouse name: Not on file   ??? Number of children: Not on file   ??? Years of education: Not on file   ??? Highest education level: Not on file   Occupational History   ??? Not on file   Tobacco Use   ??? Smoking status: Former Smoker     Packs/day: 0.50     Years: 30.00     Pack years: 15.00     Types: Cigarettes   ??? Smokeless tobacco: Never Used   ??? Tobacco comment: started smoking age as a teen   Substance and Sexual Activity   ??? Alcohol use:  No   ??? Drug use: No   ??? Sexual activity: Not on file   Other Topics Concern   ??? Exercise Not Asked   ??? Living Situation Not Asked   ??? Do you use sunscreen? No   ??? Tanning bed use? No   ??? Are you easily burned? No   ??? Excessive sun exposure? No   ??? Blistering sunburns? No   Social History Narrative   ??? Not on file     Social Determinants of Health     Financial Resource Strain:    ??? Difficulty of Paying Living Expenses:    Food Insecurity: Food Insecurity Present   ??? Worried About Programme researcher, broadcasting/film/video in the Last Year: Sometimes true   ??? Ran Out of Food in the Last Year: Sometimes true   Transportation Needs: Unmet Transportation Needs   ??? Lack of Transportation (Medical): Yes   ??? Lack of Transportation (Non-Medical): Yes   Physical Activity:    ??? Days of Exercise per Week:    ??? Minutes of Exercise per Session:    Stress:    ??? Feeling of Stress :    Social Connections:    ??? Frequency of Communication with Friends and Family:    ??? Frequency of Social Gatherings with Friends and Family:    ??? Attends Religious Services:    ??? Database administrator or Organizations:    ??? Attends Engineer, structural:    ??? Marital Status:        ROS:   Otherwise, 12 point review of systems was completed and is negative except as per HPI.      Vitals:   Vitals:    12/06/19 1036   BP: 123/76   Pulse: 65   Temp: 35.9 ??C (96.7 ??F)   SpO2: 99%     ?  Physical Exam:   Gen: AA in NAD  HEENT: NCAT, EOMI, PERRL, Non icteric sclerae, MMM  CV: RRR  RESP: quiet respirations on room air  EXT: warm and well perfused  BUTTOCKS: R buttock s/p excision well healed. L buttock wound with healthy granulation tissue. ??No??drainage or erythema. ??About 6 mm of new skin growth required for full closure  BREAST: bilateral pendulous breasts, with evidence of HS present up to above the NAC.  No present drainage with pressure  ?  Impression and Plan:  Jalesa Thien is here today to discuss possible BRM for decrease in disease burden of HS.  -Would recommend SM pedicle  -Discussed this may leave some of the higher lesions behind.    -Will submit to insurance        No follow-ups on file.'

## 2019-12-21 DIAGNOSIS — L732 Hidradenitis suppurativa: Principal | ICD-10-CM

## 2019-12-22 MED ORDER — EMPTY CONTAINER
2 refills | 0 days
Start: 2019-12-22 — End: ?

## 2019-12-22 NOTE — Unmapped (Signed)
[facility] Specialty Pharmacy Refill Coordination Note    Specialty Medication(s) to be Shipped:   Inflammatory Disorders: Humira    Other medication(s) to be shipped: sharps container     Elizabeth Browning, DOB: 07/14/1966  Phone: [phone] (home)       All above HIPAA information was verified with patient.     Was a Nurse, learning disability used for this call? No    Completed refill call assessment today to schedule patient's medication shipment from the [facility] [phone]).       Specialty medication(s) and dose(s) confirmed: Regimen is correct and unchanged.   Changes to medications: Taquana reports no changes at this time.  Changes to insurance: No  Questions for the pharmacist: No    Confirmed patient received Welcome Packet with first shipment. The patient will receive a drug information handout for each medication shipped and additional FDA Medication Guides as required.       DISEASE/MEDICATION-SPECIFIC INFORMATION        For patients on injectable medications: Patient currently has patient thinks 2 but should be 1 doses left.  Next injection is scheduled for 12/24/2019.    SPECIALTY MEDICATION ADHERENCE     Medication Adherence    Patient reported X missed doses in the last month: 0  Specialty Medication: Humira CF 40 mg/0.4 ml  Patient is on additional specialty medications: No  Any gaps in refill history greater than 2 weeks in the last 3 months: no  Demonstrates understanding of importance of adherence: yes  Informant: patient  Reliability of informant: reliable  Confirmed plan for next specialty medication refill: delivery by pharmacy  Refills needed for supportive medications: not needed                      SHIPPING     Shipping address confirmed in Epic.     Delivery Scheduled: Yes, Expected medication delivery date: 12/29/2019.     Medication will be delivered via UPS to the prescription address in Epic WAM.    Takeria Marquina D Kohner Orlick   [facility] Specialty Technician

## 2019-12-28 MED FILL — HUMIRA PEN CITRATE FREE 40 MG/0.4 ML: 28 days supply | Qty: 8 | Fill #10

## 2019-12-28 MED FILL — EMPTY CONTAINER: 60 days supply | Qty: 1 | Fill #0

## 2019-12-28 MED FILL — EMPTY CONTAINER: 60 days supply | Qty: 1 | Fill #0 | Status: AC

## 2019-12-28 MED FILL — HUMIRA PEN CITRATE FREE 40 MG/0.4 ML: 28 days supply | Qty: 8 | Fill #10 | Status: AC

## 2020-01-20 NOTE — Unmapped (Signed)
[facility] Specialty Pharmacy Refill Coordination Note    Specialty Medication(s) to be Shipped:   Inflammatory Disorders: Humira    Other medication(s) to be shipped:      Elizabeth Browning, DOB: Nov 01, 1965  Phone: [phone] (home)       All above HIPAA information was verified with patient.     Was a Nurse, learning disability used for this call? No    Completed refill call assessment today to schedule patient's medication shipment from the [facility] [phone]).       Specialty medication(s) and dose(s) confirmed: Regimen is correct and unchanged.   Changes to medications: Daniell reports no changes at this time.  Changes to insurance: No  Questions for the pharmacist: No    Confirmed patient received Welcome Packet with first shipment. The patient will receive a drug information handout for each medication shipped and additional FDA Medication Guides as required.       DISEASE/MEDICATION-SPECIFIC INFORMATION        For patients on injectable medications: Patient currently has 2 doses left.  Next injection is scheduled for 01/21/2020.    SPECIALTY MEDICATION ADHERENCE     Medication Adherence    Patient reported X missed doses in the last month: 0  Specialty Medication: Humira CF 40 mg/0.4 ml  Patient is on additional specialty medications: No  Any gaps in refill history greater than 2 weeks in the last 3 months: no  Demonstrates understanding of importance of adherence: yes  Informant: patient  Reliability of informant: reliable  Confirmed plan for next specialty medication refill: delivery by pharmacy  Refills needed for supportive medications: not needed                      SHIPPING     Shipping address confirmed in Epic.     Delivery Scheduled: Yes, Expected medication delivery date: 01/27/2020.     Medication will be delivered via UPS to the prescription address in Epic WAM.    Jermine Bibbee D Evelean Bigler   [facility] Shared [facility] Specialty Technician

## 2020-01-26 MED FILL — HUMIRA PEN CITRATE FREE 40 MG/0.4 ML: 28 days supply | Qty: 8 | Fill #11 | Status: AC

## 2020-01-26 MED FILL — HUMIRA PEN CITRATE FREE 40 MG/0.4 ML: 28 days supply | Qty: 8 | Fill #11

## 2020-02-16 DIAGNOSIS — L732 Hidradenitis suppurativa: Principal | ICD-10-CM

## 2020-02-16 NOTE — Unmapped (Signed)
[facility] Specialty Pharmacy Clinical Assessment & Refill Coordination Note    Elizabeth Browning, DOB: [birth year]  Phone: [phone] (home)     All above HIPAA information was verified with patient.     Was a Nurse, learning disability used for this call? No    Specialty Medication(s):   Inflammatory Disorders: Humira     Current Outpatient Medications   Medication Sig Dispense Refill   ??? ADALIMUMAB PEN CITRATE FREE 40 MG/0.4 ML Inject the contents of 2 pens (80mg ) under the skin every 7 days as directed by prescriber 8 each 11   ??? ADVAIR DISKUS 250-50 mcg/dose diskus INHALE 1 PUFF BY MOUTH TWICE DAILY 12 HOURS APART     ??? albuterol HFA 90 mcg/actuation inhaler Inhale 2 puffs every four (4) hours as needed.     ??? DEXILANT 60 mg capsule TK 1 C PO QAM FOR HEARTBURN AND ABDOMINAL PAIN     ??? empty container Misc use as directed 1 each 2   ??? empty container Misc Use as directed 1 each 2   ??? gabapentin (NEURONTIN) 300 MG capsule Take 3 capsules (900 mg total) by mouth Three (3) times a day for 14 days. 126 capsule 0   ??? ibuprofen (MOTRIN) 800 MG tablet Take 800 mg by mouth every six (6) hours as needed. (Patient not taking: Reported on 11/08/2019)     ??? ipratropium-albuteroL (DUO-NEB) 0.5-2.5 mg/3 mL nebulizer USE 1 VIAL VIA NEBULIZER EVERY 4 TO 6 HOURS AS NEEDED FOR COUGH OR WHEEZING     ??? oxyCODONE (ROXICODONE) 10 mg immediate release tablet Take 1 tablet (10 mg total) by mouth every six (6) hours as needed for pain. (Patient not taking: Reported on 10/25/2019) 14 tablet 0   ??? pregabalin (LYRICA) 50 MG capsule Take 1 capsule (50 mg total) by mouth Three (3) times a day. 90 capsule 2   ??? traZODone (DESYREL) 50 MG tablet Take 50-100 mg by mouth nightly. (Patient not taking: Reported on 10/25/2019)       No current facility-administered medications for this visit.        Changes to medications: Taleigha reports no changes at this time.    Allergies   Allergen Reactions   ??? Buprenorphine Hcl Hives     Unknown-pt denies   ??? Codeine Rash, Hives and Itching   ??? Adhesive Rash   ??? Iodinated Contrast Media Rash   ??? Latex Rash   ??? Morphine Itching   ??? Opioids - Morphine Analogues Itching     Unknown-pt denies         Changes to allergies: No    SPECIALTY MEDICATION ADHERENCE     Humira 40/0.4 mg/ml: 16 days of medicine on hand       Medication Adherence    Patient reported X missed doses in the last month: 0  Specialty Medication: Humira 40 mg/0.4 ml  Patient is on additional specialty medications: No  Informant: patient  Confirmed plan for next specialty medication refill: delivery by pharmacy  Refills needed for supportive medications: yes, ordered or provider notified          Specialty medication(s) dose(s) confirmed: Regimen is correct and unchanged.     Are there any concerns with adherence? No    Adherence counseling provided? Not needed    CLINICAL MANAGEMENT AND INTERVENTION      Clinical Benefit Assessment:    Do you feel the medicine is effective or helping your condition? Yes    Clinical  Benefit counseling provided? Not needed    Adverse Effects Assessment:    Are you experiencing any side effects? No    Are you experiencing difficulty administering your medicine? No    Quality of Life Assessment:    How many days over the past month did your HS  keep you from your normal activities? For example, brushing your teeth or getting up in the morning. 0    Have you discussed this with your provider? Not needed    Therapy Appropriateness:    Is therapy appropriate? Yes, therapy is appropriate and should be continued    DISEASE/MEDICATION-SPECIFIC INFORMATION      For patients on injectable medications: Patient currently has 2 doses left.  Next injection is scheduled for 02/18/20.    PATIENT SPECIFIC NEEDS     - Does the patient have any physical, cognitive, or cultural barriers? No    - Is the patient high risk? No    - Does the patient require a Care Management Plan? No     - Does the patient require physician intervention or other additional services (i.e. nutrition, smoking cessation, social work)? No      SHIPPING     Specialty Medication(s) to be Shipped:   Inflammatory Disorders: Humira    Other medication(s) to be shipped: No additional medications requested for fill at this time     Changes to insurance: No    Delivery Scheduled: Yes, Expected medication delivery date: 02/23/20.  However, Rx request for refills was sent to the provider as there are none remaining.     Medication will be delivered via UPS to the confirmed prescription address in [facility].    The patient will receive a drug information handout for each medication shipped and additional FDA Medication Guides as required.  Verified that patient has previously received a Conservation officer, historic buildings.    All of the patient's questions and concerns have been addressed.    Tyrina Hines Vangie Bicker   [facility] Shared [facility] Specialty Pharmacist

## 2020-02-16 NOTE — Unmapped (Signed)
Rx request Humira  Last OV: 02/01/19  Rx pending for approval

## 2020-02-17 MED ORDER — HUMIRA PEN CITRATE FREE 40 MG/0.4 ML
SUBCUTANEOUS | 11 refills | 0.00000 days | Status: CP
Start: 2020-02-17 — End: ?
  Filled 2020-02-22: qty 8, 28d supply, fill #0

## 2020-02-22 MED FILL — HUMIRA PEN CITRATE FREE 40 MG/0.4 ML: 28 days supply | Qty: 8 | Fill #0 | Status: AC

## 2020-03-16 NOTE — Unmapped (Signed)
[facility] Specialty Pharmacy Refill Coordination Note    Specialty Medication(s) to be Shipped:   Inflammatory Disorders: Humira    Other medication(s) to be shipped: No additional medications requested for fill at this time     Kyrstyn Greear, DOB: 01-Jul-1966  Phone: [phone] (home)       All above HIPAA information was verified with patient.     Was a Nurse, learning disability used for this call? No    Completed refill call assessment today to schedule patient's medication shipment from the [facility] [phone]).  All relevant notes have been reviewed.     Specialty medication(s) and dose(s) confirmed: Regimen is correct and unchanged.   Changes to medications: Ubah reports no changes at this time.  Changes to insurance: No  New side effects reported: None reported    Confirmed patient received Welcome Packet with first shipment. The patient will receive a drug information handout for each medication shipped and additional FDA Medication Guides as required.       DISEASE/MEDICATION-SPECIFIC INFORMATION        For patients on injectable medications: Patient currently has 2 doses left.  Next injection is scheduled for 03/17/20.    SPECIALTY MEDICATION ADHERENCE     Medication Adherence    Specialty Medication: Humira 40 mg/0.4 ml  Informant: patient  Confirmed plan for next specialty medication refill: delivery by pharmacy  Refills needed for supportive medications: not needed              Were doses missed due to medication being on hold? No    Humira 40/0.4 mg/ml: 15 days of medicine on hand       REFERRAL TO PHARMACIST     Referral to the pharmacist: Not needed      [facility]     Shipping address confirmed in Epic.     Delivery Scheduled: Yes, Expected medication delivery date: 03/28/20.     Medication will be delivered via UPS to the prescription address in Epic WAM.    Helia Haese Vangie Bicker   [facility] Specialty Pharmacist

## 2020-03-27 DIAGNOSIS — L732 Hidradenitis suppurativa: Principal | ICD-10-CM

## 2020-03-27 MED FILL — HUMIRA PEN CITRATE FREE 40 MG/0.4 ML: 28 days supply | Qty: 8 | Fill #1 | Status: AC

## 2020-03-27 MED FILL — HUMIRA PEN CITRATE FREE 40 MG/0.4 ML: SUBCUTANEOUS | 28 days supply | Qty: 8 | Fill #1

## 2020-04-17 NOTE — Unmapped (Signed)
-----   Message from Maggie Font, [place] sent at 04/14/2020  3:20 PM EDT -----  Regarding: REESCHEDUILE  PT NEEDS TO RESCHEDULE DUE TO TRANSPORTATION

## 2020-04-19 NOTE — Unmapped (Signed)
[facility] Specialty Pharmacy Refill Coordination Note    Specialty Medication(s) to be Shipped:   Inflammatory Disorders: Humira    Other medication(s) to be shipped: No additional medications requested for fill at this time     Letecia Arps, DOB: December 25, 1965  Phone: [phone] (home)       All above HIPAA information was verified with patient.     Was a Nurse, learning disability used for this call? No    Completed refill call assessment today to schedule patient's medication shipment from the [facility] [phone]).       Specialty medication(s) and dose(s) confirmed: Regimen is correct and unchanged.   Changes to medications: Katrenia reports no changes at this time.  Changes to insurance: No  Questions for the pharmacist: No    Confirmed patient received Welcome Packet with first shipment. The patient will receive a drug information handout for each medication shipped and additional FDA Medication Guides as required.       DISEASE/MEDICATION-SPECIFIC INFORMATION        For patients on injectable medications: Patient currently has 4 doses left.  Next injection is scheduled for 10/08,10/15.    SPECIALTY MEDICATION ADHERENCE     Medication Adherence    Patient reported X missed doses in the last month: 0  Specialty Medication: humira 40mg /0.34ml  Patient is on additional specialty medications: No  Informant: patient  Reliability of informant: reliable  Patient is at risk for Non-Adherence: No                humira 40/0.4 mg/ml: 14 days of medicine on hand         SHIPPING     Shipping address confirmed in Epic.     Delivery Scheduled: Yes, Expected medication delivery date: 10/14.     Medication will be delivered via UPS to the prescription address in Epic WAM.    [name]   [facility] Specialty Technician

## 2020-04-26 MED FILL — HUMIRA PEN CITRATE FREE 40 MG/0.4 ML: 28 days supply | Qty: 8 | Fill #2 | Status: AC

## 2020-04-26 MED FILL — HUMIRA PEN CITRATE FREE 40 MG/0.4 ML: SUBCUTANEOUS | 28 days supply | Qty: 8 | Fill #2

## 2020-05-25 NOTE — Unmapped (Signed)
[facility] Specialty Pharmacy Refill Coordination Note    Specialty Medication(s) to be Shipped:   Inflammatory Disorders: Humira    Other medication(s) to be shipped: No additional medications requested for fill at this time     Elizabeth Browning, DOB: 09/21/1965  Phone: [phone] (home)       All above HIPAA information was verified with patient.     Was a Nurse, learning disability used for this call? No    Completed refill call assessment today to schedule patient's medication shipment from the [facility] [phone]).       Specialty medication(s) and dose(s) confirmed: Regimen is correct and unchanged.   Changes to medications: Quianna reports no changes at this time.  Changes to insurance: No  Questions for the pharmacist: No    Confirmed patient received Welcome Packet with first shipment. The patient will receive a drug information handout for each medication shipped and additional FDA Medication Guides as required.       DISEASE/MEDICATION-SPECIFIC INFORMATION        For patients on injectable medications: Patient currently has 4 doses left.  Next injection is scheduled for 05/26/2020.    SPECIALTY MEDICATION ADHERENCE     Medication Adherence    Patient reported X missed doses in the last month: 0  Specialty Medication: Humira CF 40 mg/0.4 ml   Patient is on additional specialty medications: No  Any gaps in refill history greater than 2 weeks in the last 3 months: no  Demonstrates understanding of importance of adherence: yes  Informant: patient  Reliability of informant: reliable  Confirmed plan for next specialty medication refill: delivery by pharmacy  Refills needed for supportive medications: not needed                humira 40/0.4 mg/ml: 14 days of medicine on hand         SHIPPING     Shipping address confirmed in Epic.     Delivery Scheduled: Yes, Expected medication delivery date: 05/31/2020.     Medication will be delivered via UPS to the prescription address in Epic WAM.    Elizabeth Browning   [facility] Shared [facility] Specialty Technician

## 2020-05-30 MED FILL — HUMIRA PEN CITRATE FREE 40 MG/0.4 ML: 28 days supply | Qty: 8 | Fill #3 | Status: AC

## 2020-05-30 MED FILL — HUMIRA PEN CITRATE FREE 40 MG/0.4 ML: SUBCUTANEOUS | 28 days supply | Qty: 8 | Fill #3

## 2020-06-21 NOTE — Unmapped (Signed)
[facility] Refill Coordination Note    Specialty Medication(s) to be Shipped:   Inflammatory Disorders: Humira    Other medication(s) to be shipped: No additional medications requested for fill at this time     Elizabeth Browning, DOB: 1965-08-07  Phone: [phone] (home)       All above HIPAA information was verified with patient.     Was a Nurse, learning disability used for this call? No    Completed refill call assessment today to schedule patient's medication shipment from the [facility] [phone]).       Specialty medication(s) and dose(s) confirmed: Regimen is correct and unchanged.   Changes to medications: [name] reports no changes at this time.  Changes to insurance: No  Questions for the pharmacist: No    Confirmed patient received Welcome Packet with first shipment. The patient will receive a drug information handout for each medication shipped and additional FDA Medication Guides as required.       DISEASE/MEDICATION-SPECIFIC INFORMATION        For patients on injectable medications: Patient currently has 1 doses left.  Next injection is scheduled for 12/10.    SPECIALTY MEDICATION ADHERENCE     Medication Adherence    Patient reported X missed doses in the last month: 0  Specialty Medication: humira 40mg /0.36ml  Patient is on additional specialty medications: No  Patient is on more than two specialty medications: No  Any gaps in refill history greater than 2 weeks in the last 3 months: no  Demonstrates understanding of importance of adherence: yes  Informant: patient  Reliability of informant: reliable  Provider-estimated medication adherence level: good  Patient is at risk for Non-Adherence: No                humira 40/0.4 mg/ml: 7 days of medicine on hand         SHIPPING     Shipping address confirmed in Epic.     Delivery Scheduled: Yes, Expected medication delivery date: 12/10.     Medication will be delivered via UPS to the prescription address in Epic WAM.    [name]   [facility] Specialty Technician

## 2020-06-22 MED FILL — HUMIRA PEN CITRATE FREE 40 MG/0.4 ML: 28 days supply | Qty: 8 | Fill #4 | Status: AC

## 2020-06-22 MED FILL — HUMIRA PEN CITRATE FREE 40 MG/0.4 ML: SUBCUTANEOUS | 28 days supply | Qty: 8 | Fill #4

## 2020-07-17 NOTE — Unmapped (Signed)
Shannette reports things are stable with her Humira. She has one current HS area, but overall feels that treatment has been very helpful. No issues identified.     [facility] Clinical Assessment & Refill Coordination Note    Aubery Date, DOB: August 15, 1965  Phone: [phone] (home)     All above HIPAA information was verified with patient.     Was a Nurse, learning disability used for this call? No    Specialty Medication(s):   Inflammatory Disorders: Humira     Current Outpatient Medications   Medication Sig Dispense Refill   ??? ADVAIR DISKUS 250-50 mcg/dose diskus INHALE 1 PUFF BY MOUTH TWICE DAILY 12 HOURS APART     ??? albuterol HFA 90 mcg/actuation inhaler Inhale 2 puffs every four (4) hours as needed.     ??? DEXILANT 60 mg capsule TK 1 C PO QAM FOR HEARTBURN AND ABDOMINAL PAIN     ??? empty container Misc use as directed 1 each 2   ??? empty container Misc Use as directed 1 each 2   ??? gabapentin (NEURONTIN) 300 MG capsule Take 3 capsules (900 mg total) by mouth Three (3) times a day for 14 days. 126 capsule 0   ??? HUMIRA PEN CITRATE FREE 40 MG/0.4 ML Inject the contents of 2 pens (80mg ) under the skin every 7 days as directed by prescriber 8 each 11   ??? ibuprofen (MOTRIN) 800 MG tablet Take 800 mg by mouth every six (6) hours as needed. (Patient not taking: Reported on 11/08/2019)     ??? ipratropium-albuteroL (DUO-NEB) 0.5-2.5 mg/3 mL nebulizer USE 1 VIAL VIA NEBULIZER EVERY 4 TO 6 HOURS AS NEEDED FOR COUGH OR WHEEZING     ??? oxyCODONE (ROXICODONE) 10 mg immediate release tablet Take 1 tablet (10 mg total) by mouth every six (6) hours as needed for pain. (Patient not taking: Reported on 10/25/2019) 14 tablet 0   ??? pregabalin (LYRICA) 50 MG capsule Take 1 capsule (50 mg total) by mouth Three (3) times a day. 90 capsule 2   ??? traZODone (DESYREL) 50 MG tablet Take 50-100 mg by mouth nightly. (Patient not taking: Reported on 10/25/2019)       No current facility-administered medications for this visit.        Changes to medications: Valeri reports no changes at this time.    Allergies   Allergen Reactions   ??? Buprenorphine Hcl Hives     Unknown-pt denies   ??? Codeine Rash, Hives and Itching   ??? Adhesive Rash   ??? Iodinated Contrast Media Rash   ??? Latex Rash   ??? Morphine Itching   ??? Opioids - Morphine Analogues Itching     Unknown-pt denies         Changes to allergies: No    SPECIALTY MEDICATION ADHERENCE     Humira 40/0.4 mg/ml: 1 dose remaining  Medication Adherence    Patient reported X missed doses in the last month: 0  Specialty Medication: Humira          Specialty medication(s) dose(s) confirmed: Regimen is correct and unchanged.     Are there any concerns with adherence? No    Adherence counseling provided? Not needed    CLINICAL MANAGEMENT AND INTERVENTION      Clinical Benefit Assessment:    Do you feel the medicine is effective or helping your condition? Yes    Clinical Benefit counseling provided? Not needed    Adverse Effects Assessment:  Are you experiencing any side effects? No    Are you experiencing difficulty administering your medicine? No    Quality of Life Assessment:    How many days over the past month did your HS  keep you from your normal activities? For example, brushing your teeth or getting up in the morning. 0    Have you discussed this with your provider? Not needed    Therapy Appropriateness:    Is therapy appropriate? Yes, therapy is appropriate and should be continued    DISEASE/MEDICATION-SPECIFIC INFORMATION      For patients on injectable medications: Patient currently has 1 doses left.  Next injection is scheduled for 07/21/20.    PATIENT SPECIFIC NEEDS     - Does the patient have any physical, cognitive, or cultural barriers? No    - Is the patient high risk? No    - Does the patient require a Care Management Plan? No     - Does the patient require physician intervention or other additional services (i.e. nutrition, smoking cessation, social work)? No      SHIPPING Specialty Medication(s) to be Shipped:   Inflammatory Disorders: Humira    Other medication(s) to be shipped: No additional medications requested for fill at this time     Changes to insurance: No    Delivery Scheduled: Yes, Expected medication delivery date: Tues, Jan 11.     Medication will be delivered via UPS to the confirmed prescription address in [facility].    The patient will receive a drug information handout for each medication shipped and additional FDA Medication Guides as required.  Verified that patient has previously received a Conservation officer, historic buildings.    All of the patient's questions and concerns have been addressed.    Lanney Gins   [facility] Specialty Pharmacist

## 2020-07-24 MED FILL — HUMIRA PEN CITRATE FREE 40 MG/0.4 ML: SUBCUTANEOUS | 28 days supply | Qty: 8 | Fill #5

## 2020-08-14 NOTE — Unmapped (Signed)
[facility] Refill Coordination Note    Specialty Medication(s) to be Shipped:   Inflammatory Disorders: Humira    Other medication(s) to be shipped: No additional medications requested for fill at this time     Elizabeth Browning, DOB: [birth year]  Phone: [phone] (home)       All above HIPAA information was verified with patient.     Was a Nurse, learning disability used for this call? No    Completed refill call assessment today to schedule patient's medication shipment from the [facility] [phone]).       Specialty medication(s) and dose(s) confirmed: Regimen is correct and unchanged.   Changes to medications: Elizabeth Browning reports no changes at this time.  Changes to insurance: No  Questions for the pharmacist: No    Confirmed patient received Welcome Packet with first shipment. The patient will receive a drug information handout for each medication shipped and additional FDA Medication Guides as required.       DISEASE/MEDICATION-SPECIFIC INFORMATION        For patients on injectable medications: Patient currently has 1 doses left.  Next injection is scheduled for 08/18/2020.    SPECIALTY MEDICATION ADHERENCE     Medication Adherence    Patient reported X missed doses in the last month: 0  Specialty Medication: Humira CF 40 mg/0.4 ml   Patient is on additional specialty medications: No  Any gaps in refill history greater than 2 weeks in the last 3 months: no  Demonstrates understanding of importance of adherence: yes  Informant: patient  Reliability of informant: reliable  Confirmed plan for next specialty medication refill: delivery by pharmacy  Refills needed for supportive medications: not needed                humira 40/0.4 mg/ml: 7 days of medicine on hand         SHIPPING     Shipping address confirmed in Epic.     Delivery Scheduled: Yes, Expected medication delivery date: 08/17/2020.     Medication will be delivered via UPS to the prescription address in Epic WAM.    Elizabeth Browning D Blaklee Shores   [facility] Specialty Technician

## 2020-08-16 MED FILL — HUMIRA PEN CITRATE FREE 40 MG/0.4 ML: SUBCUTANEOUS | 28 days supply | Qty: 8 | Fill #6

## 2020-09-07 NOTE — Unmapped (Signed)
[facility] Refill Coordination Note    Specialty Medication(s) to be Shipped:   Inflammatory Disorders: Humira    Other medication(s) to be shipped: No additional medications requested for fill at this time     Elizabeth Browning, DOB: April 18, 1966  Phone: [phone] (home)       All above HIPAA information was verified with patient.     Was a Nurse, learning disability used for this call? No    Completed refill call assessment today to schedule patient's medication shipment from the [facility] [phone]).       Specialty medication(s) and dose(s) confirmed: Regimen is correct and unchanged.   Changes to medications: Telsa reports no changes at this time.  Changes to insurance: No  Questions for the pharmacist: No    Confirmed patient received Welcome Packet with first shipment. The patient will receive a drug information handout for each medication shipped and additional FDA Medication Guides as required.       DISEASE/MEDICATION-SPECIFIC INFORMATION        For patients on injectable medications: Patient currently has 2 doses left.  Next injection is scheduled for 09/08/2020.    SPECIALTY MEDICATION ADHERENCE     Medication Adherence    Patient reported X missed doses in the last month: 0  Specialty Medication: Humira CF 40 mg/0.4 ml  Patient is on additional specialty medications: No  Any gaps in refill history greater than 2 weeks in the last 3 months: no  Demonstrates understanding of importance of adherence: yes  Informant: patient  Reliability of informant: reliable  Confirmed plan for next specialty medication refill: delivery by pharmacy  Refills needed for supportive medications: not needed                        SHIPPING     Shipping address confirmed in Epic.     Delivery Scheduled: Yes, Expected medication delivery date: 09/12/2020.     Medication will be delivered via UPS to the prescription address in Epic WAM.    Promise Weldin D Brinda Focht   [facility] Specialty Technician

## 2020-09-11 MED FILL — HUMIRA PEN CITRATE FREE 40 MG/0.4 ML: SUBCUTANEOUS | 28 days supply | Qty: 8 | Fill #7

## 2020-10-01 MED ORDER — PANTOPRAZOLE 20 MG TABLET,DELAYED RELEASE
Freq: Every day | ORAL | 0 days
Start: 2020-10-01 — End: ?

## 2020-10-02 NOTE — Unmapped (Signed)
[facility] Refill Coordination Note    Specialty Medication(s) to be Shipped:   Inflammatory Disorders: Humira    Other medication(s) to be shipped: No additional medications requested for fill at this time     Elizabeth Browning, DOB: 06-Sep-1965  Phone: [phone] (home)       All above HIPAA information was verified with patient.     Was a Nurse, learning disability used for this call? No    Completed refill call assessment today to schedule patient's medication shipment from the [facility] [phone]).       Specialty medication(s) and dose(s) confirmed: Regimen is correct and unchanged.   Changes to medications: Abbie reports no changes at this time.  Changes to insurance: No  Questions for the pharmacist: No    Confirmed patient received Welcome Packet with first shipment. The patient will receive a drug information handout for each medication shipped and additional FDA Medication Guides as required.       DISEASE/MEDICATION-SPECIFIC INFORMATION        For patients on injectable medications: Patient currently has 2 doses left.  Next injection is scheduled for 10/06/2020.    SPECIALTY MEDICATION ADHERENCE     Medication Adherence    Patient reported X missed doses in the last month: 0  Specialty Medication: Humira CF 40 mg/0.4 ml  Patient is on additional specialty medications: No  Any gaps in refill history greater than 2 weeks in the last 3 months: no  Demonstrates understanding of importance of adherence: yes  Informant: patient  Reliability of informant: reliable  Confirmed plan for next specialty medication refill: delivery by pharmacy  Refills needed for supportive medications: not needed                        SHIPPING     Shipping address confirmed in Epic.     Delivery Scheduled: Yes, Expected medication delivery date: 10/10/2020.     Medication will be delivered via UPS to the prescription address in Epic WAM.    Tuvia Woodrick D Emmary Culbreath   [facility] Specialty Technician

## 2020-10-09 MED FILL — HUMIRA PEN CITRATE FREE 40 MG/0.4 ML: SUBCUTANEOUS | 28 days supply | Qty: 8 | Fill #8

## 2020-10-11 ENCOUNTER — Ambulatory Visit: Admit: 2020-10-11 | Discharge: 2020-10-12 | Payer: MEDICAID | Attending: Dermatology | Primary: Dermatology

## 2020-10-11 DIAGNOSIS — L732 Hidradenitis suppurativa: Principal | ICD-10-CM

## 2020-10-11 DIAGNOSIS — Z79899 Other long term (current) drug therapy: Principal | ICD-10-CM

## 2020-10-11 MED ORDER — GOLIMUMAB 100 MG/ML SUBCUTANEOUS PEN INJECTOR
SUBCUTANEOUS | 11 refills | 0.00000 days | Status: CP
Start: 2020-10-11 — End: ?
  Filled 2020-10-19: qty 4, 42d supply, fill #0

## 2020-10-11 MED ORDER — OXYCODONE-ACETAMINOPHEN 5 MG-325 MG TABLET
ORAL_TABLET | ORAL | 0 refills | 2.00000 days | Status: CP | PRN
Start: 2020-10-11 — End: ?

## 2020-10-11 MED ORDER — SIMPONI 100 MG/ML SUBCUTANEOUS PEN INJECTOR
0 refills | 0.00000 days | Status: CP
Start: 2020-10-11 — End: 2020-10-11

## 2020-10-11 MED ORDER — CEFDINIR 300 MG CAPSULE
ORAL_CAPSULE | Freq: Two times a day (BID) | ORAL | 5 refills | 30.00000 days | Status: CP
Start: 2020-10-11 — End: ?

## 2020-10-11 NOTE — Unmapped (Signed)
Hidradenitis suppurativa  - Start golimumab (SIMPONI) 100 mg/mL PnIj; Inject 200 mg subcutaneously on week 0, week 2, then every 4 weeks  Dispense: 4 mL; Refill: 11  - Start cefdinir (OMNICEF) 300 MG capsule; Take 1 capsule (300 mg total) by mouth Two (2) times a day.  Dispense: 60 capsule; Refill: 5  - Start oxyCODONE-acetaminophen (PERCOCET) 5-325 mg per tablet; Take 1 tablet by mouth every four (4) hours as needed for pain.  Dispense: 10 tablet; Refill: 0

## 2020-10-11 NOTE — Unmapped (Signed)
Dermatology Note     Assessment and Plan:      Hidradenitis Suppurativa, severe Hurley 3--chronic, with exacerbation: Failed 80mg  weekly humira, 40mg  weekly humira  Chronic problem with exacerbation, not at goal    - this is a chronic illness that is expected to last greater than 1 year and possibly the life of the patient.   - We discussed the typical natural history, pathogenesis, treatment options, and expected course as well as the relapsing and sometimes recalcitrant nature of the disease.    - S/p WLE in B/L axillae and buttocks with graft repair by Plastic surgery, with improvement.  - Failed Humira 80 mg weekly.  - Given fear around IVs, hold Remicade for now, but may need in the future.  - Start golimumab (SIMPONI) 100 mg/mL PnIj; Inject 200 mg subcutaneously on week 0, week 2, then every 4 weeks  Dispense: 4 mL; Refill: 11  R/B/A reviewed including but not limited to off label use, risk infection.   - Continue Humira 80 mg weekly for now while awaiting Golimumab initiation.  - Start cefdinir (OMNICEF) 300 MG capsule; Take 1 capsule (300 mg total) by mouth Two (2) times a day.  Dispense: 60 capsule; Refill: 5. She reported an allergy to an unknown antibiotic. Reviewed chart and no allergy to antibiotic was found. She has previously tolerated cephalosporins without issues.  R/B/A reviewed including but not limited to GI upset.   - For acute pain, start oxyCODONE-acetaminophen (PERCOCET) 5-325 mg per tablet; Take 1 tablet by mouth every four (4) hours as needed for pain.  Dispense: 10 tablet; Refill: 0  R/B/A reviewed including but not limited to sedation.   - Avoiding Spironolactone and Finasteride given breast cancer history in family (sister).    High risk medication use (Golimumab):  - Recheck quant gold at next visit.  - She has a history of treated hepatitis C (glecaprevir/pibrentas course early 2018 with undetectable viral load since then).    ??    The patient was advised to call for an appointment should any new, changing, or symptomatic lesions develop.     This visit was billed based on medical decision making as detailed below:   Problem: 1 or more chronic illnesses with exacerbation, progression, or side effects of treatment (moderate)   Data: (Straightforward) Minimal or none   Management: Prescription drug management (moderate)       RTC: Return in about 3 months (around 01/11/2021) for HS - 30 min. or sooner as needed   _________________________________________________________________      Chief Complaint     Chief Complaint   Patient presents with   ??? Hidradenitis     flare up       HPI     Elizabeth Browning is a 55 y.o. female who presents as a returning patient (last seen 02/01/2019) to [facility] for follow up of HS.     Since last visit with Dr. Janyth Contes, patient has continued on Humira 80 mg weekly. She has had COVID infection, but no other serious hospitalizations, illnesses, or side effects from Humira. Overall things are improving, including with plastic surgery excisions of HS on the buttocks. She reports several flares involving back, posterior neck, L axilla, buttocks, and thighs. Some with active drainage. Sister with breast cancer history.    Severity today: not answered  Pain today: 10  Pain average over last month: 10  How often in pain: Continuously  Requiring pain medication? not answered   If  so, what type/frequency? n/a  Odor today: not answered  Itching today: 0  Frequency of bandage changes? twice a day  Drainage: some drainage  >25% worsening in last 3 months? YES   If no, when was it last markedly worse? not answered  How often getting new lesions? weekly  Number of inflammatory lesions monthly: >10    DLQI: 23    Any new health conditions since last visit? not answered    How helpful is the current treatment in managing the following aspects of your disease?  Pain: Not at all helpful  Decreasing length of flares: Not answered  Decreasing new lesions: Not answered  Drainage: Not answered  Decreasing frequency of flares: Not answered  Decreasing severity of flares: Not answered  Odor: Not answered      The patient denies any other new or changing lesions or areas of concern.     Pertinent Past Medical History     No history of skin cancer    Problem List        Musculoskeletal and Integument    Hidradenitis suppurativa - Primary     Added automatically from request for surgery [id number]         Relevant Medications    golimumab (SIMPONI) 100 mg/mL PnIj    cefdinir (OMNICEF) 300 MG capsule    oxyCODONE-acetaminophen (PERCOCET) 5-325 mg per tablet          Family History:   Unknown     Past Medical History, Family History, Social History, Medication List, Allergies, and Problem List were reviewed in the rooming section of Epic.     ROS: Other than symptoms mentioned in the HPI, no fevers, chills, or other skin complaints    Physical Examination     GENERAL: Well-appearing female in no acute distress, resting comfortably.  NEURO: Alert and oriented, answers questions appropriately  PSYCH: Normal mood and affect  SKIN (Focal Skin Exam): Per patient request, examination of neck, back, axillae, legs, buttocks was performed  -Well healed scars on R axilla  -1 draining sinus in L axilla, 1 draining interconnected sinus on L thigh  -Several inflamed nodules and draining sinus tracts on back  -Well healed grafts on B/L buttocks    All areas not commented on are within normal limits or unremarkable      (Approved Template 03/27/2020)

## 2020-10-12 DIAGNOSIS — L732 Hidradenitis suppurativa: Principal | ICD-10-CM

## 2020-10-13 DIAGNOSIS — L732 Hidradenitis suppurativa: Principal | ICD-10-CM

## 2020-10-13 MED ORDER — OXYCODONE-ACETAMINOPHEN 5 MG-325 MG TABLET
ORAL_TABLET | ORAL | 0 refills | 2.00000 days | PRN
Start: 2020-10-13 — End: ?

## 2020-10-16 NOTE — Unmapped (Signed)
[facility] Specialty Medication Onboarding    Specialty Medication: Simponi 100mg /ml pen (loading and maintenance)  Prior Authorization: Approved   Financial Assistance: No - copay  <$25  Final Copay/Day Supply: $3 / 42 days (load)                                            $3 / 28 days (maintenance)     Insurance Restrictions: None     Notes to Pharmacist:     The triage team has completed the benefits investigation and has determined that the patient is able to fill this medication at [facility]. Please contact the patient to complete the onboarding or follow up with the prescribing physician as needed.

## 2020-10-17 NOTE — Unmapped (Signed)
Pt called nurse triage line reporting that she developed a severe HS flare last Thursday, the day after she was seen in clinic.  She reports 2 new lesions the size of softballs located to her previous surgical site near the gluteal cleft.  Reports they are draining and painful.  Reports fever and chills with Tmax=103F yesterday.  She has been soaking in warm baths and applying heating pad to area and is using dry gauze for the drainage.  She ran out of oxycodone/apap last Saturday.  When she was taking pain med, it reduced pain from 10/10 to 5-6/10.  Pain is currently 10/10.

## 2020-10-17 NOTE — Unmapped (Signed)
After updating Drs. Regino Schultze and Elder Cyphers, called patient and left voicemail instructing her to go to the ED today for treatment for the fever and pain.  Informed that Dr. Elder Cyphers would like to follow up with her in clinic next Wednesday morning and to call back to set up that appointment.  MyChart message also sent with instructions.

## 2020-10-18 NOTE — Unmapped (Signed)
[facility]   Patient Onboarding/Medication Counseling    [facility] is a 55 y.o. female with hidradenitis who I am counseling today on initiation of therapy.  I am speaking to the patient.    Was a Nurse, learning disability used for this call? No    Verified patient's date of birth / HIPAA.    Specialty medication(s) to be sent: Inflammatory Disorders: Simponi      Non-specialty medications/supplies to be sent: na      Medications not needed at this time: na         Simponi (golimumab)    Medication & Administration     Dosage: Off-label dosing for HS: 200 mg at weeks 0, 2, then every 4 weeks thereafter      Lab tests required prior to treatment initiation:  ??? Tuberculosis: Tuberculosis screening resulted in a non-reactive Quantiferon TB Gold assay.  ??? Hepatitis B: Hepatitis B serology studies are complete and non-reactive.      Administration:     Prefilled syringe  1. Gather all supplies needed for injection on a clean, flat working surface: medication syringe(s) removed from packaging, alcohol swab, sharps container, etc.  2. Look at the medication label ??? look for correct medication, correct dose, and check the expiration date  3. Look at the medication ??? the liquid in the syringe should appear clear and colorless to slightly yellow and may contain tiny white or clear particles  4. Lay the syringe on a flat surface and allow it to warm up to room temperature for at least 30 minutes  5. Select injection site ??? you can use the front of your thigh or your belly (but not the area 2 inches around your belly button); if someone else is giving you the injection you can also use your upper arm in the skin covering your triceps muscle  6. Prepare injection site ??? wash your hands and clean the skin at the injection site with an alcohol swab and let it air dry, do not touch the injection site again before the injection  7. Pull off the needle safety cap, do not remove until immediately prior to injection  8. Pinch the skin ??? with your hand not holding the syringe pinch up a fold of skin at the injection site using your forefinger and thumb  9. Insert the needle into the fold of skin at about a 45 degree angle ??? it's best to use a quick dart-like motion; once the needle is in place you can release the your pinch and allow the skin at the injection site to relax  10. Push the plunger down slowly as far as it will go until the syringe is empty, if the plunger is not fully depressed the needle shield will not extend to cover the needle when it is removed, hold the syringe in place for a full 5 seconds  11. Check that the syringe is empty and keep pressing down on the plunger while you pull the needle out at the same angle as inserted; after the needle is removed completely from the skin, release the plunger allowing the needle shield to activate and cover the used needle  12. Dispose of the used syringe immediately in your sharps disposal container, do not attempt to recap the needle prior to disposing  13. If you see any blood at the injection site, press a cotton ball or gauze on the site and maintain pressure until the bleeding stops, do not rub the  injection site      Prefilled SmartJect?? auto-injector pen  1. Gather all supplies needed for injection on a clean, flat working surface: medication pen removed from packaging, alcohol swab, sharps container, etc.  2. Look at the medication label ??? look for correct medication, correct dose, and check the expiration date  3. Look at the medication ??? the liquid visible in the window on the side of the pen device should appear clear and colorless to slightly yellow and may contain tiny white or clear particles  4. Lay the auto-injector pen on a flat surface and allow it to warm up to room temperature for at least 30 minutes  5. Select injection site ??? you can use the front of your thigh or your belly (but not the area 2 inches around your belly button); if someone else is giving you the injection you can also use your upper arm in the skin covering your triceps muscle  6. Prepare injection site ??? wash your hands and clean the skin at the injection site with an alcohol swab and let it air dry, do not touch the injection site again before the injection  7. Twist off the safety cap to break the security seal and then pull it straight off, do not remove until immediately prior to injection and do not touch the green needle cover  8. Put the green needle cover against your skin at the injection site at a 90 degree angle, hold the pen such that you can see the clear medication window  9. Push the pen firmly against the injection side so that the green safety cover slides into the clear cover ??? take care not to touch the activation button yet  10. While maintaining downward pressure, press the raised part of the activation button to initiate the injection process; you will hear a click to indicate the injection has started  11. Continue to hold the pen firmly against your skin for about 5-15 seconds ??? the window will start to turn yellow  12. There will be a second click sound when the injection is complete, verify the yellow indicator has stopped moving in the viewing window to confirm the injection is fully complete and then pull the pen away from your skin  13. Dispose of the used auto-injector pen immediately in your sharps disposal container the needle will be covered automatically  14. If you see any blood at the injection site, press a cotton ball or gauze on the site and maintain pressure until the bleeding stops, do not rub the injection site      Adherence/Missed dose instructions:  If your injection is given more than 4 days after your scheduled injection date ??? consult your pharmacist for additional instructions on how to adjust your dosing schedule.      Goals of Therapy   Ankylosing spondylitis  ??? Relief of symptoms  ??? Maintenance of function  ??? Prevention of complications of spinal disease  ??? Minimization of extraspinal and extraarticular manifestations and comorbidities  ??? Maintenance of effective psychosocial functioning    Psoriatic arthritis  ??? Achieve remission/inactive disease or low/minimal disease activity  ??? Maintenance of function  ??? Minimization of systemic manifestations and comorbidities  ??? Maintenance of effective psychosocial functioning    Rheumatoid arthritis  ??? Achieve symptom remission  ??? Slow disease progression  ??? Protection of remaining articular structures  ??? Maintenance of function  ??? Maintenance of effective psychosocial functioning    Ulcerative colitis  ???  Achieve remission of symptoms  ??? Maintain remission of symptoms  ??? Minimize long-term systemic glucocorticoid use  ??? Prevent need for surgical procedures  ??? Maintenance of effective psychosocial functioning      Side Effects & Monitoring Parameters     ??? Injection site reaction (redness, irritation, inflammation localized to the site of administration)  ??? Skin rash    The following side effects should be reported to the provider:  ??? Signs of a hypersensitivity reaction ??? rash; hives; itching; red, swollen, blistered, or peeling skin; wheezing; tightness in the chest or throat; difficulty breathing, swallowing, or talking; swelling of the mouth, face, lips, tongue, or throat; etc.  ??? Reduced immune function ??? report signs of infection such as fever; chills; body aches; very bad sore throat; ear or sinus pain; cough; more sputum or change in color of sputum; pain with passing urine; wound that will not heal, etc.  Also at a slightly higher risk of some malignancies (mainly skin and blood cancers) due to this reduced immune function.  o In the case of signs of infection ??? the patient should hold the next dose of Simponi?? and call your primary care provider to ensure adequate medical care.  Treatment may be resumed when infection is treated and patient is asymptomatic.  ??? Signs of lupus-like reaction ??? rash on cheeks, skin photosensitivity, muscle/joint pain, swelling in extremities, etc.  ??? Signs of unexplained bruising or bleeding ??? throwing up blood or emesis that looks like coffee grounds; black, tarry, or bloody stool; etc.  ??? Changes in skin ??? a new growth or lump that forms; changes in shape, size, or color of a previous mole or marking      Contraindications, Warnings, & Precautions     ??? Have your bloodwork checked as you have been told by your prescriber  ??? Talk with your doctor if you are pregnant, planning to become pregnant, or breastfeeding  ??? Discuss the possible need for holding your dose(s) of Simponi?? when a planned procedure is scheduled with the prescriber as it may delay healing/recovery timeline       Drug/Food Interactions     ??? Medication list reviewed in Epic. The patient was instructed to inform the care team before taking any new medications or supplements. No drug interactions identified.   ??? If you have a latex allergy use caution when handling, the needle cap of the Simponi?? prefilled syringe and the safety cap for the Simponi SmartJect?? pen contains a derivative of natural rubber latex  ??? Talk with you prescriber or pharmacist before receiving any live vaccinations while taking this medication and after you stop taking it    Storage, Handling Precautions, & Disposal     ??? Store this medication in the refrigerator.  Do not freeze  ??? If needed, you may store at room temperature for up to 30 days  ??? Store in original packaging, protected from light  ??? Do not shake  ??? Dispose of used syringes/pens in a sharps disposal container                            Current Medications (including OTC/herbals), Comorbidities and Allergies     Current Outpatient Medications   Medication Sig Dispense Refill   ??? ADVAIR DISKUS 250-50 mcg/dose diskus INHALE 1 PUFF BY MOUTH TWICE DAILY 12 HOURS APART     ??? albuterol HFA 90 mcg/actuation inhaler Inhale 2 puffs every four (4)  hours as needed.     ??? cefdinir (OMNICEF) 300 MG capsule Take 1 capsule (300 mg total) by mouth Two (2) times a day. 60 capsule 5   ??? DEXILANT 60 mg capsule TK 1 C PO QAM FOR HEARTBURN AND ABDOMINAL PAIN     ??? empty container Misc use as directed 1 each 2   ??? empty container Misc Use as directed 1 each 2   ??? gabapentin (NEURONTIN) 300 MG capsule Take 3 capsules (900 mg total) by mouth Three (3) times a day for 14 days. 126 capsule 0   ??? golimumab (SIMPONI) 100 mg/mL PnIj Inject the contents of 2 pens (200mg ) under the skin on week 0 and week 2 as loading doses. 4 mL 0   ??? golimumab 100 mg/mL PnIj Inject the contents of 2 pens (200mg ) under the skin every 4 weeks as maintenance. 2 mL 11   ??? HUMIRA PEN CITRATE FREE 40 MG/0.4 ML Inject the contents of 2 pens (80mg ) under the skin every 7 days as directed by prescriber 8 each 11   ??? ibuprofen (MOTRIN) 800 MG tablet Take 800 mg by mouth every six (6) hours as needed.      ??? ipratropium-albuteroL (DUO-NEB) 0.5-2.5 mg/3 mL nebulizer USE 1 VIAL VIA NEBULIZER EVERY 4 TO 6 HOURS AS NEEDED FOR COUGH OR WHEEZING     ??? oxyCODONE (ROXICODONE) 10 mg immediate release tablet Take 1 tablet (10 mg total) by mouth every six (6) hours as needed for pain. 14 tablet 0   ??? oxyCODONE-acetaminophen (PERCOCET) 5-325 mg per tablet Take 1 tablet by mouth every four (4) hours as needed for pain. 10 tablet 0   ??? pregabalin (LYRICA) 50 MG capsule Take 1 capsule (50 mg total) by mouth Three (3) times a day. 90 capsule 2   ??? traZODone (DESYREL) 50 MG tablet Take 50-100 mg by mouth nightly.        No current facility-administered medications for this visit.       Allergies   Allergen Reactions   ??? Buprenorphine Hcl Hives     Unknown-pt denies   ??? Codeine Rash, Hives and Itching   ??? Adhesive Rash   ??? Iodinated Contrast Media Rash   ??? Latex Rash   ??? Morphine Itching   ??? Opioids - Morphine Analogues Itching     Unknown-pt denies         Patient Active Problem List   Diagnosis   ??? Migraine   ??? Vitamin D deficiency   ??? Fatigue   ??? Hepatitis C   ??? Chronic hepatitis C without hepatic coma (CMS-HCC)   ??? Neuropathic pain   ??? Foot pain   ??? Colon polyp   ??? Anxiety   ??? Asthma   ??? Chronic bronchitis (CMS-HCC)   ??? DOE (dyspnea on exertion)   ??? Loosening of knee joint prosthesis (CMS-HCC)   ??? Acute pain of left knee   ??? Drug-induced constipation   ??? Tobacco abuse   ??? Hidradenitis suppurativa   ??? Hidradenitis       Reviewed and up to date in Epic.    Appropriateness of Therapy     Acute infections noted within Epic:  No active infections  Patient reported infection: None    Is medication and dose appropriate based on diagnosis and infection status? No - evidence provided by prescriber in 3/30 note    Prescription has been clinically reviewed: Yes      Baseline Quality of Life  Assessment      How many days over the past month did your HS  keep you from your normal activities? For example, brushing your teeth or getting up in the morning. several - has had severe pain/swelling     Financial Information     Medication Assistance provided: Prior Authorization    Anticipated copay of $3 reviewed with patient. Verified delivery address.    Delivery Information     Scheduled delivery date: Friday, 4/8    Expected start date: Friday, 4/8    Medication will be delivered via UPS to the prescription address in [facility].  This shipment will not require a signature.      Explained the services we provide at [facility] and that each month we would call to set up refills.  Stressed importance of returning phone calls so that we could ensure they receive their medications in time each month.  Informed patient that we should be setting up refills 7-10 days prior to when they will run out of medication.  A pharmacist will reach out to perform a clinical assessment periodically.  Informed patient that a welcome packet, containing information about our pharmacy and other support services, a Notice of Privacy Practices, and a drug information handout will be sent. Patient verbalized understanding of the above information as well as how to contact the pharmacy at [phone] option 4 with any questions/concerns.  The pharmacy is open Monday through Friday 8:30am-4:30pm.  A pharmacist is available 24/7 via pager to answer any clinical questions they may have.    Patient Specific Needs     - Does the patient have any physical, cognitive, or cultural barriers? No    - Patient prefers to have medications discussed with  Patient     - Is the patient or caregiver able to read and understand education materials at a high school level or above? Yes    - Patient's primary language is  English     - Is the patient high risk? No    - Does the patient require a Care Management Plan? No     - Does the patient require physician intervention or other additional services (i.e. nutrition, smoking cessation, social work)? No      [facility] Desiree [facility] Specialty Pharmacist

## 2020-11-20 NOTE — Unmapped (Signed)
[facility] reports she's had no new flares since starting Simponi (she had been having a lot while on Humira a month or so ago). Her existing lesions are still draining, but less painful than prior. She has continued on her cefdinir antibiotic.     She's done well with her new injections, and denies adverse effects.      [facility] Clinical Assessment & Refill Coordination Note    [facility], DOB: 01/12/1966  Phone: [phone] (home)     All above HIPAA information was verified with patient.     Was a Nurse, learning disability used for this call? No    Specialty Medication(s):   Inflammatory Disorders: Simponi     Current Outpatient Medications   Medication Sig Dispense Refill   ??? ADVAIR DISKUS 250-50 mcg/dose diskus INHALE 1 PUFF BY MOUTH TWICE DAILY 12 HOURS APART     ??? albuterol HFA 90 mcg/actuation inhaler Inhale 2 puffs every four (4) hours as needed.     ??? cefdinir (OMNICEF) 300 MG capsule Take 1 capsule (300 mg total) by mouth Two (2) times a day. 60 capsule 5   ??? DEXILANT 60 mg capsule TK 1 C PO QAM FOR HEARTBURN AND ABDOMINAL PAIN     ??? empty container Misc use as directed 1 each 2   ??? empty container Misc Use as directed 1 each 2   ??? gabapentin (NEURONTIN) 300 MG capsule Take 3 capsules (900 mg total) by mouth Three (3) times a day for 14 days. 126 capsule 0   ??? golimumab (SIMPONI) 100 mg/mL PnIj Inject the contents of 2 pens (200mg ) under the skin on week 0 and week 2 as loading doses. 4 mL 0   ??? golimumab 100 mg/mL PnIj Inject the contents of 2 pens (200mg ) under the skin every 4 weeks as maintenance. 2 mL 11   ??? HUMIRA PEN CITRATE FREE 40 MG/0.4 ML Inject the contents of 2 pens (80mg ) under the skin every 7 days as directed by prescriber 8 each 11   ??? ibuprofen (MOTRIN) 800 MG tablet Take 800 mg by mouth every six (6) hours as needed.      ??? ipratropium-albuteroL (DUO-NEB) 0.5-2.5 mg/3 mL nebulizer USE 1 VIAL VIA NEBULIZER EVERY 4 TO 6 HOURS AS NEEDED FOR COUGH OR WHEEZING     ??? oxyCODONE (ROXICODONE) 10 mg immediate release tablet Take 1 tablet (10 mg total) by mouth every six (6) hours as needed for pain. 14 tablet 0   ??? oxyCODONE-acetaminophen (PERCOCET) 5-325 mg per tablet Take 1 tablet by mouth every four (4) hours as needed for pain. 10 tablet 0   ??? pregabalin (LYRICA) 50 MG capsule Take 1 capsule (50 mg total) by mouth Three (3) times a day. 90 capsule 2   ??? traZODone (DESYREL) 50 MG tablet Take 50-100 mg by mouth nightly.        No current facility-administered medications for this visit.        Changes to medications: Anyssa reports no changes at this time.    Allergies   Allergen Reactions   ??? Buprenorphine Hcl Hives     Unknown-pt denies   ??? Codeine Rash, Hives and Itching   ??? Adhesive Rash   ??? Iodinated Contrast Media Rash   ??? Latex Rash   ??? Morphine Itching   ??? Opioids - Morphine Analogues Itching     Unknown-pt denies         Changes to allergies: No  SPECIALTY MEDICATION ADHERENCE     Simponi - 0 left  Medication Adherence    Patient reported X missed doses in the last month: 0  Specialty Medication: Simponi  Patient is on additional specialty medications: No          Specialty medication(s) dose(s) confirmed: Regimen is correct and unchanged.     Are there any concerns with adherence? No    Adherence counseling provided? Not needed    CLINICAL MANAGEMENT AND INTERVENTION      Clinical Benefit Assessment:    Do you feel the medicine is effective or helping your condition? unclear    Clinical Benefit counseling provided? Reasonable expectations discussed: may take 8-12 weeks for full effect. i encouraged her to make a f/u appt for this timeframe    Adverse Effects Assessment:    Are you experiencing any side effects? No    Are you experiencing difficulty administering your medicine? No    Quality of Life Assessment:    How many days over the past month did your HS  keep you from your normal activities? For example, brushing your teeth or getting up in the morning. Patient declined to answer    Have you discussed this with your provider? Not needed    Acute Infection Status:    Acute infections noted within Epic:  No active infections  Patient reported infection: None    Therapy Appropriateness:    Is therapy appropriate? Yes, therapy is appropriate and should be continued    DISEASE/MEDICATION-SPECIFIC INFORMATION      For patients on injectable medications: Patient currently has 0 doses left.  Next injection is scheduled for 5/20.    PATIENT SPECIFIC NEEDS     - Does the patient have any physical, cognitive, or cultural barriers? No    - Is the patient high risk? No    - Does the patient require a Care Management Plan? No     - Does the patient require physician intervention or other additional services (i.e. nutrition, smoking cessation, social work)? No      SHIPPING     Specialty Medication(s) to be Shipped:   Inflammatory Disorders: Simponi    Other medication(s) to be shipped: No additional medications requested for fill at this time     Changes to insurance: No    Delivery Scheduled: Yes, Expected medication delivery date: Tues, 5/17.     Medication will be delivered via UPS to the confirmed prescription address in [place].    The patient will receive a drug information handout for each medication shipped and additional FDA Medication Guides as required.  Verified that patient has previously received a Conservation officer, historic buildings and a Surveyor, mining.    The patient or caregiver noted above participated in the development of this care plan and knows that they can request review of or adjustments to the care plan at any time.      All of the patient's questions and concerns have been addressed.    Lanney Gins   [facility] Specialty Pharmacist

## 2020-11-22 MED ORDER — PREMARIN 0.625 MG/GRAM VAGINAL CREAM
0.00000 days
Start: 2020-11-22 — End: ?

## 2020-11-22 MED ORDER — URO-MP 118 MG-10 MG-40.8 MG-36 MG CAPSULE
0.00000 days
Start: 2020-11-22 — End: ?

## 2020-11-27 MED FILL — SIMPONI 100 MG/ML SUBCUTANEOUS PEN INJECTOR: 28 days supply | Qty: 2 | Fill #0

## 2020-12-20 NOTE — Unmapped (Signed)
[facility] Refill Coordination Note    Specialty Medication(s) to be Shipped:   Inflammatory Disorders: Simponi    Other medication(s) to be shipped: No additional medications requested for fill at this time     [facility], DOB: 20-Apr-1966  Phone: [phone] (home)       All above HIPAA information was verified with patient.     Was a Nurse, learning disability used for this call? No    Completed refill call assessment today to schedule patient's medication shipment from the [facility] [phone]).  All relevant notes have been reviewed.     Specialty medication(s) and dose(s) confirmed: Regimen is correct and unchanged.   Changes to medications: [facility] reports no changes at this time.  Changes to insurance: No  New side effects reported not previously addressed with a pharmacist or physician: None reported  Questions for the pharmacist: No    Confirmed patient received a Conservation officer, historic buildings and a Surveyor, mining with first shipment. The patient will receive a drug information handout for each medication shipped and additional FDA Medication Guides as required.       DISEASE/MEDICATION-SPECIFIC INFORMATION        For patients on injectable medications: Patient currently has 0 doses left.  Next injection is scheduled for 12/29/2020.    SPECIALTY MEDICATION ADHERENCE     Medication Adherence    Patient reported X missed doses in the last month: 0  Specialty Medication: SIMPONI 100 mg/mL  Patient is on additional specialty medications: No  Any gaps in refill history greater than 2 weeks in the last 3 months: no  Demonstrates understanding of importance of adherence: yes  Informant: patient  Reliability of informant: reliable  Confirmed plan for next specialty medication refill: delivery by pharmacy  Refills needed for supportive medications: not needed              Were doses missed due to medication being on hold? No    Simponi 100 mg/ml: 0 days of medicine on hand REFERRAL TO PHARMACIST     Referral to the pharmacist: Not needed      [facility]     Shipping address confirmed in Epic.     Delivery Scheduled: Yes, Expected medication delivery date: 12/22/2020.     Medication will be delivered via UPS to the prescription address in Epic W[facility].    Jaidan Stachnik D Jayde Mcallister   [facility] Specialty Technician

## 2020-12-21 MED FILL — SIMPONI 100 MG/ML SUBCUTANEOUS PEN INJECTOR: 28 days supply | Qty: 2 | Fill #1

## 2020-12-25 NOTE — Unmapped (Signed)
Dr Regino Schultze, This patient is in a lot of pain.    LOV: 10/11/2020     Your doctor wants [place] to collect the following information to help determine the next step:     What can we help you with?     HS flair     Where is the lesion, area located?   Several lesions of left thigh.   How long has it been there?   5 days ago.  Is it worsening or improving?  Cant walk or sit!   Are you doing anything for this?  Soaking in hot water and a heating pad.    Is it painful, itching, tender, and draining?   Very painful, not draining.   Any other symptoms (fevers, chills)?   Fever of 103 on Saturday, chills none since.    Have you had anything like this before?   A couple of months ago.    Do you have a history of skin cancer?   No     Are you on meds or have an illness that make you more susceptible to infection?   No   Anything else that we should know?   1.  She had gone to her local ED,but left due to an anticipated 6 hour wait to e treated.  2.  Can not get in earlier then her current appointment on 01/10/2021 due to transportation restrictions-the company requires a week's notice to schedule transportation.     I advised patient seek medical help through a local urgent care if she gets a fever again.I also told her that I would pass all this information to her dermatologist today.    Thanks! [facility]

## 2020-12-25 NOTE — Unmapped (Signed)
Agree with RN recommendation for presenting to local urgent care or ED if situation worsens/fever returns. Sent MyChart message to patient to inquire if she is already on antibiotics (Cefdinir prescribed at last visit).

## 2021-01-01 ENCOUNTER — Ambulatory Visit: Admit: 2021-01-01 | Discharge: 2021-01-02 | Payer: MEDICAID

## 2021-01-01 DIAGNOSIS — L732 Hidradenitis suppurativa: Principal | ICD-10-CM

## 2021-01-01 DIAGNOSIS — F17209 Nicotine dependence, unspecified, with unspecified nicotine-induced disorders: Principal | ICD-10-CM

## 2021-01-01 NOTE — Unmapped (Signed)
Patient stated understanding to call us when she is free from smoking for more than 6 weeks so that she can take a urine nicotine test. She also understands surgery will be pushed back until a clear nicotine test is achieved and then can schedule surgery.

## 2021-01-05 NOTE — Unmapped (Signed)
I did not see this patient today; she was scheduled to see me, but instead was evaluated by our nursing team, and planned for a nicotine test as she is still actively smoking.

## 2021-01-10 ENCOUNTER — Ambulatory Visit: Admit: 2021-01-10 | Discharge: 2021-01-11 | Payer: MEDICAID | Attending: Dermatology | Primary: Dermatology

## 2021-01-10 DIAGNOSIS — L910 Hypertrophic scar: Principal | ICD-10-CM

## 2021-01-10 DIAGNOSIS — L732 Hidradenitis suppurativa: Principal | ICD-10-CM

## 2021-01-10 DIAGNOSIS — Z79899 Other long term (current) drug therapy: Principal | ICD-10-CM

## 2021-01-10 MED ORDER — OXYCODONE-ACETAMINOPHEN 10 MG-325 MG TABLET
ORAL_TABLET | ORAL | 0 refills | 2.00000 days | Status: CP | PRN
Start: 2021-01-10 — End: ?

## 2021-01-10 MED ORDER — AMOXICILLIN 875 MG-POTASSIUM CLAVULANATE 125 MG TABLET
ORAL_TABLET | Freq: Two times a day (BID) | ORAL | 0 refills | 30.00000 days | Status: CP
Start: 2021-01-10 — End: 2021-02-09

## 2021-01-10 MED ORDER — SPIRONOLACTONE 50 MG TABLET
ORAL_TABLET | 5 refills | 0.00000 days | Status: CP
Start: 2021-01-10 — End: ?

## 2021-01-10 NOTE — Unmapped (Addendum)
We appreciated the opportunity to see you in clinic today! Your resident doctor was Louanne Skye, MD and your attending doctor was Venia Carbon, MD.    Continue on Simponi every 4 weeks.     Start Augmentin twice daily. Stop cefdinir.     Spironolactone 50 m g for 1 week then 100 mg daily thereafter.     If any of your medications are too expensive, you can look for a coupon at GoodRx.com  - Enter the medication name, size, and your zip code to find coupons for local pharmacies.   - You can print a coupon and bring it to the pharmacy, or pull up the coupon on a smartphone.  - You can also call pharmacies ahead of time to ask about the cost of your medication before you pick it up.    Please also feel free to call the clinic at [phone] with any concerns. We look forward to seeing you again!

## 2021-01-10 NOTE — Unmapped (Signed)
Dermatology Note     Assessment and Plan:      Hidradenitis Suppurativa, severe Hurley 3--chronic, with exacerbation today: Failed 80mg  weekly humira, 40mg  weekly humira  Chronic problem with exacerbation, not at goal    - this is a chronic illness that is expected to last greater than 1 year and possibly the life of the patient.   - We discussed the typical natural history, pathogenesis, treatment options, and expected course as well as the relapsing and sometimes recalcitrant nature of the disease. ??  - S/p WLE in B/L axillae and buttocks with graft repair by Plastic surgery, with improvement. Seen by Dr. Lafayette Dragon in Plastics recently with plan to operate on left thigh, but delayed due to patient's smoking status.  - Failed Humira 40 and 80 mg weekly.  - She has completed 2 injections of Simponi and has begun to flare with the transition from Humira.   - Given??fear around IVs, hold??Remicade for now, but may need in the future. We also discussed Cosentyx (Pens) if she fails Simponi.   - Discussed starting spironolactone. No personal history of breast cancer. She does have history of cervical cancer.   Spironolactone: Risks of feminization of female fetus if pregnancy, dizziness, breast tenderness, increased potassium were discussed.  Discussed pregnancy category C and patient should discontinue the medication if trying to conceive or if becomes pregnant. Of note patient s/p hysterectomy, tubal ligation.  - we will stop cefdinir and switch to Augmentin.      Plan:  - Continue golimumab (SIMPONI) 100 mg/mL PnIj; Inject 200 mg subcutaneously every 4 weeks.  R/B/A reviewed including but not limited to off label use, risk infection.   - start amoxicillin-clavulanate (AUGMENTIN) 875-125 mg per tablet; Take 1 tablet by mouth Two (2) times a day.  - start oxyCODONE-acetaminophen (PERCOCET) 10-325 mg per tablet; Take 1 tablet by mouth every four (4) hours as needed for pain.  - start spironolactone (ALDACTONE) 50 MG tablet; Take 50 mg daily for 1 week, and increase to 100 mg daily.    High risk medication use (Golimumab):  - Check Quant gold today. Will have drawn at [facility]  - She has a history of treated hepatitis C (glecaprevir/pibrentas course early 2018 with undetectable viral load since then). ??    The patient was advised to call for an appointment should any new, changing, or symptomatic lesions develop.     This visit was billed based on medical decision making as detailed below:   Problem: 1 or more chronic illnesses with exacerbation, progression, or side effects of treatment (moderate)   Data: (Straightforward) Minimal or none   Management: Prescription drug management (moderate)       RTC: Return in about 3 months (around 04/12/2021) for f/u HS [place]. or sooner as needed   _________________________________________________________________      Chief Complaint     Chief Complaint   Patient presents with   ??? HS     Flare-ups on buttocks and L thigh       HPI     Elizabeth Browning is a 55 y.o. female who presents as a returning patient (last seen 10/11/2020) to [facility] for follow up of HS .     Visit, patient switched from Humira 80 mg weekly to use;Marland Kitchen  She has completed 2 injections.  She reported having a reaction after her first injection but has been well-tolerated with subsequent injections.  Unfortunately patient is currently flaring in multiple locations including buttocks,  left thigh, pannus, groin area.  She is currently on cefdinir as well, but appear to be effective.  She has significant drainage today and pain.  She follows with plastic surgery, Dr. Lafayette Dragon.  There is a plan for surgery involving the left thigh, but appears this was delayed due to patient's smoking status.    Self-reported severity (0-5): 5  VAS pain today: 10  VAS average pain for the last month: 10  Requiring pain medication? Yes.  If so, what type/frequency? continously  How often in pain?  continuously  Level of odor (0-5): 1  Level of itching (0-5): 5  Dressing changes needed for drainage:Twice a day  How much drainage: some drainage  Flare in the last month (Y/N)? Yes.  How long ago was the last flare? in last 6 months  Developing new lesions? once a week  Number of inflammatory lesions montly: 5-10  DLQI: 30  Current treatment: Anibiotics and Humira  How helpful is the current treatment in managing the following aspects of your disease?  Not at all helpful Somewhat helpful Very helpful   Pain X     Decreasing length of flares X     Decreasing new lesions X     Drainage X     Decreasing frequency of flares X     Decreasing severity of flares X     Odor          30      The patient denies any other new or changing lesions or areas of concern.     Pertinent Past Medical History     HS    Family History:   None    Past Medical History, Family History, Social History, Medication List, Allergies, and Problem List were reviewed in the rooming section of Epic.     ROS: Other than symptoms mentioned in the HPI, no fevers, chills, or other skin complaints    Physical Examination     GENERAL: Well-appearing female in no acute distress, resting comfortably.  NEURO: Alert and oriented, answers questions appropriately  PSYCH: Normal mood and affect  EYES: lids clear, conjunctiva pink, no scleral icterus or other color change  SKIN: Examination of the abdomen, back, bilateral upper extremities, buttocks, genitalia and groin was performed  - large draining sinus tract and IN located at left thigh  - Buttocks with well healed previous surgical sites  - Draining sinus at left superior buttocks  - multiple IN with drainage at the groin, pannus    All areas not commented on are within normal limits or unremarkable      (Approved Template 03/27/2020)

## 2021-01-10 NOTE — Unmapped (Signed)
See below

## 2021-01-12 NOTE — Unmapped (Signed)
[facility] Refill Coordination Note    Specialty Medication(s) to be Shipped:   Inflammatory Disorders: Simponi    Other medication(s) to be shipped: No additional medications requested for fill at this time     Elizabeth Browning, DOB: 04-Sep-1965  Phone: [phone] (home)       All above HIPAA information was verified with patient.     Was a Nurse, learning disability used for this call? No    Completed refill call assessment today to schedule patient's medication shipment from the [facility] [phone]).  All relevant notes have been reviewed.     Specialty medication(s) and dose(s) confirmed: Regimen is correct and unchanged.   Changes to medications: Elizabeth Browning reports no changes at this time.  Changes to insurance: No  New side effects reported not previously addressed with a pharmacist or physician: None reported  Questions for the pharmacist: No    Confirmed patient received a Conservation officer, historic buildings and a Surveyor, mining with first shipment. The patient will receive a drug information handout for each medication shipped and additional FDA Medication Guides as required.       DISEASE/MEDICATION-SPECIFIC INFORMATION        For patients on injectable medications: Patient currently has 0 doses left.  Next injection is scheduled for 01/18/2021.    SPECIALTY MEDICATION ADHERENCE     Medication Adherence    Patient reported X missed doses in the last month: 0  Specialty Medication: SIMPONI 100 mg/mL  Patient is on additional specialty medications: No  Any gaps in refill history greater than 2 weeks in the last 3 months: no  Demonstrates understanding of importance of adherence: yes  Informant: patient  Reliability of informant: reliable  Confirmed plan for next specialty medication refill: delivery by pharmacy  Refills needed for supportive medications: not needed              Were doses missed due to medication being on hold? No    Simponi 100 mg/ml: 0 days of medicine on hand REFERRAL TO PHARMACIST     Referral to the pharmacist: Not needed      [facility]     Shipping address confirmed in Epic.     Delivery Scheduled: Yes, Expected medication delivery date: 01/17/2021.     Medication will be delivered via UPS to the prescription address in Epic WAM.    Sreekar Broyhill D Chaney Ingram   [facility] Specialty Technician

## 2021-01-16 MED FILL — SIMPONI 100 MG/ML SUBCUTANEOUS PEN INJECTOR: 28 days supply | Qty: 2 | Fill #2

## 2021-01-17 DIAGNOSIS — R52 Pain, unspecified: Principal | ICD-10-CM

## 2021-01-17 MED ORDER — OXYCODONE-ACETAMINOPHEN 5 MG-325 MG TABLET
ORAL_TABLET | Freq: Four times a day (QID) | ORAL | 0 refills | 3.00000 days | Status: CP | PRN
Start: 2021-01-17 — End: 2021-01-20

## 2021-01-17 NOTE — Unmapped (Signed)
Hi Dr. Elder Cyphers,     Patient requesting refill for pain medication.  Patient also stated that her pharmacy sent request for pain medication on her behalf as well yesterday.     Thanks!!

## 2021-02-01 NOTE — Unmapped (Signed)
[facility] Refill Coordination Note    Specialty Medication(s) to be Shipped:   Inflammatory Disorders: Simponi    Other medication(s) to be shipped: No additional medications requested for fill at this time     Elizabeth Browning, DOB: 20-Apr-1966  Phone: [phone] (home)       All above HIPAA information was verified with patient.     Was a Nurse, learning disability used for this call? No    Completed refill call assessment today to schedule patient's medication shipment from the [facility] [phone]).  All relevant notes have been reviewed.     Specialty medication(s) and dose(s) confirmed: Regimen is correct and unchanged.   Changes to medications: Elizabeth Browning reports no changes at this time.  Changes to insurance: No  New side effects reported not previously addressed with a pharmacist or physician: None reported  Questions for the pharmacist: No    Confirmed patient received a Conservation officer, historic buildings and a Surveyor, mining with first shipment. The patient will receive a drug information handout for each medication shipped and additional FDA Medication Guides as required.       DISEASE/MEDICATION-SPECIFIC INFORMATION        For patients on injectable medications: Patient currently has 0 doses left.  Next injection is scheduled for 8/8.    SPECIALTY MEDICATION ADHERENCE     Medication Adherence    Patient reported X missed doses in the last month: 0  Specialty Medication: SIMPONI 100 mg/mL  Patient is on additional specialty medications: No  Patient is on more than two specialty medications: No  Any gaps in refill history greater than 2 weeks in the last 3 months: no  Demonstrates understanding of importance of adherence: yes  Informant: patient              Were doses missed due to medication being on hold? No    Simponi 100mg /ml: Patient has 0 days of medication on hand    REFERRAL TO PHARMACIST     Referral to the pharmacist: Not needed      [facility]     Shipping address confirmed in Epic.     Delivery Scheduled: Yes, Expected medication delivery date: 7/29.     Medication will be delivered via UPS to the prescription address in Epic WAM.    Elizabeth Browning   [facility] Specialty Technician

## 2021-02-05 ENCOUNTER — Ambulatory Visit: Admit: 2021-02-05 | Discharge: 2021-02-06 | Payer: MEDICAID

## 2021-02-05 DIAGNOSIS — L732 Hidradenitis suppurativa: Principal | ICD-10-CM

## 2021-02-05 NOTE — Unmapped (Signed)
APPOINTMENTS / QUESTIONS  For questions regarding appointments or your surgical care, Monday through Friday, 8 am to 4:30 pm    [facility]  [address],   [place] [address]  T: [phone]  F: [phone]    Ambulatory Surgery Center  1st Floor, Ambulatory Care Center  [address]  [place], [place] [address]  T: [phone]  Website:  [facility]    [facility]  [address], [place], [place] [address]  Ground Floor [facility]  T: [phone]  F: [phone]    [facility]  [address], [place], [place] [address]  T: [phone]  F: [phone]    [facility]  [address] [place], [place] [address]   1st Floor [facility]  T: [phone]  F: [phone]    [facility]  [address] [place], [place] [address]  Third Floor, [address]  T: [phone]   F: [phone]    Administrative Office  [address]  [place], [place] [address]  T:  [phone]  F:  [phone]      Physicians:  Jackquline Bosch MD:   Nurse:  Elisabeth Pigeon BSN, RN, CPSN [phone] [email or web address]  Nurse:  Mila Palmer BSN, RN, [place]  [phone] [email or web address]  Surgery Scheduler: Lysle Morales  [phone] [email or web address]     [name] MD:   Nurse:  Vista Mink BSN, RN [phone]  [email or web address]   Surgery Scheduler: Lysle Morales  [phone] [email or web address]    Tarry Kos MD:   Nurse:  Elisabeth Pigeon BSN, RN, CPSN [phone] [email or web address]  Surgery Scheduler:   Lysle Morales  [phone] [email or web address]    Adeyemi Clydie Braun MD:  Nurse: Mila Palmer BSN, RN, [facility]  [phone] [email or web address]  Surgery Scheduler: Kristine Royal [phone] [email or web address]    Darleene Cleaver MD:  Nurse: Venetia Maxon BSN, RN, CPSN  [phone]  [email or web address]  Surgery Scheduler: Kristine Royal [phone] [email or web address]    Nurse Practitioner:  Ezekiel Slocumb, DNP, NP-C:   Nurse: Britt Boozer, MSN. RN [phone]  [email or web address]  Surgery Scheduler: Kristine Royal [phone] [email or web address]    Surgery Scheduling:  You will receive a phone call from the surgery schedulers regarding date for surgery in 1-2 weeks from  your appointment.    Please call one of the above numbers if you have not received a phone call 2 weeks after your appointment with the provider.      AFTER HOURS/HOLIDAYS:  For emergencies after-hours (after 5pm, or weekends): call the [facility] operator [phone]) and ask to page the Plastic Surgery resident on call. You will be directed to a surgery resident who likely is not immediately aware of the details of your case, but can help you deal with any emergencies that cannot wait until regular business hours.  Please be aware that this person is responding to many in-hospital emergencies and patient issues and may not answer your phone call immediately, but will return your call as soon as possible.    Financial Counselor: Deloria Lair  T: [phone]  F: [phone]  E-mail:  [email or web address]     Billing Questions/Financial Navigation:  [phone]    Precare Location:  [facility] Pre-Procedure Services at [facility])   [address]  [place], [place] [address] ([place] [address] near [employer])    Blood Work Location:  [address], [place] [address].  There are no suite numbers, but it is located on the first floor there. Go to the main desk and tell them you are there for walk-in labs.  Precare:   The day prior to your scheduled surgery, Pre-Care will call you with instructions.  If you have not heard from them by 4PM, and would like to check on the status of your surgery, please call:  [facility]: [phone]  [facility]: [phone]  [facility]: [phone]  [place]:  [phone]    Weather Hotline:  [phone]    [facility] Contact Information: Please call to schedule  *Radiology (CT, X-ray, Ultrasound) [phone]  *Mammography & Breast Ultrasound [phone]  *MRI [phone]  *Interventional Radiology [phone]    [facility] forms and paperwork:  There is a $25 fee for each form that needs completed.  Please allow  a 2 week turn around for forms to be completed and faxed.

## 2021-02-06 NOTE — Unmapped (Signed)
Here for nicotine screen.  No visit performed by myself.

## 2021-02-06 NOTE — Unmapped (Signed)
Patient called checking on results of nicotine test. Patient had test done yesterday 7/25, I told her can take up to 7 days to get the results and that she would be notified through her mychart within that time frame. Patient understood.

## 2021-02-08 MED FILL — SIMPONI 100 MG/ML SUBCUTANEOUS PEN INJECTOR: 28 days supply | Qty: 2 | Fill #3

## 2021-03-02 NOTE — Unmapped (Signed)
[facility] Refill Coordination Note    Specialty Medication(s) to be Shipped:   Inflammatory Disorders: Simponi    Other medication(s) to be shipped: No additional medications requested for fill at this time     Elizabeth Browning, DOB: April 19, 1966  Phone: [phone] (home)       All above HIPAA information was verified with patient.     Was a Nurse, learning disability used for this call? No    Completed refill call assessment today to schedule patient's medication shipment from the [facility] [phone]).  All relevant notes have been reviewed.     Specialty medication(s) and dose(s) confirmed: Regimen is correct and unchanged.   Changes to medications: Elizabeth Browning reports no changes at this time.  Changes to insurance: No  New side effects reported not previously addressed with a pharmacist or physician: None reported  Questions for the pharmacist: No    Confirmed patient received a Conservation officer, historic buildings and a Surveyor, mining with first shipment. The patient will receive a drug information handout for each medication shipped and additional FDA Medication Guides as required.       DISEASE/MEDICATION-SPECIFIC INFORMATION        For patients on injectable medications: Patient currently has 0 doses left.  Next injection is scheduled for 03/19/2021.    SPECIALTY MEDICATION ADHERENCE     Medication Adherence    Patient reported X missed doses in the last month: 0  Specialty Medication: SIMPONI 100 mg/mL  Patient is on additional specialty medications: No  Any gaps in refill history greater than 2 weeks in the last 3 months: no  Demonstrates understanding of importance of adherence: yes  Informant: patient  Reliability of informant: reliable  Confirmed plan for next specialty medication refill: delivery by pharmacy  Refills needed for supportive medications: not needed              Were doses missed due to medication being on hold? No    Simponi 100mg /ml: Patient has 0 days of medication on hand    REFERRAL TO PHARMACIST     Referral to the pharmacist: Not needed      [facility]     Shipping address confirmed in Epic.     Delivery Scheduled: Yes, Expected medication delivery date: 03/07/2021.     Medication will be delivered via UPS to the prescription address in Epic WAM.    Almon Whitford D Edmond Ginsberg   [facility] Shared [facility] Specialty Technician

## 2021-03-06 MED FILL — SIMPONI 100 MG/ML SUBCUTANEOUS PEN INJECTOR: 28 days supply | Qty: 2 | Fill #4

## 2021-03-07 MED ORDER — HYDROQUINONE 4 % TOPICAL CREAM
Freq: Two times a day (BID) | TOPICAL | 3 refills | 0.00000 days | Status: CP
Start: 2021-03-07 — End: 2022-03-07

## 2021-03-07 NOTE — Unmapped (Signed)
Patient last seen 01/10/2021.  Refills pended if appropriate.

## 2021-03-07 NOTE — Unmapped (Signed)
-----   Message from Jae Dire Lino sent at 03/07/2021 12:32 PM EDT -----  Regarding: medication refill  Patient is requesting eye cream refill. She was not able to leave VM she states something is wrong with the machine keeps cutting her off. Please advise.        Thanks,  Rubin Payor

## 2021-03-20 NOTE — Unmapped (Signed)
Tried to return call to patient but no answer and no voicemail .  The Rx for dark circles , hydroquinone , was called in to pharmacy on 03/07/21, with 3 refills in case patient calls back.

## 2021-03-28 NOTE — Unmapped (Signed)
[facility] Refill Coordination Note    Specialty Medication(s) to be Shipped:   Inflammatory Disorders: Simponi    Other medication(s) to be shipped: No additional medications requested for fill at this time     Elizabeth Browning, DOB: 1965/11/02  Phone: [phone] (home)       All above HIPAA information was verified with patient.     Was a Nurse, learning disability used for this call? No    Completed refill call assessment today to schedule patient's medication shipment from the [facility] [phone]).  All relevant notes have been reviewed.     Specialty medication(s) and dose(s) confirmed: Regimen is correct and unchanged.   Changes to medications: Elizabeth Browning reports no changes at this time.  Changes to insurance: No  New side effects reported not previously addressed with a pharmacist or physician: None reported  Questions for the pharmacist: No    Confirmed patient received a Conservation officer, historic buildings and a Surveyor, mining with first shipment. The patient will receive a drug information handout for each medication shipped and additional FDA Medication Guides as required.       DISEASE/MEDICATION-SPECIFIC INFORMATION        For patients on injectable medications: Patient currently has 0 doses left.  Next injection is scheduled for 04/16/21.    SPECIALTY MEDICATION ADHERENCE     Medication Adherence    Patient reported X missed doses in the last month: 0  Specialty Medication: golimumab (SIMPONI) 100 mg/mL PnIj  Patient is on additional specialty medications: No              Were doses missed due to medication being on hold? No        REFERRAL TO PHARMACIST     Referral to the pharmacist: Not needed      [facility]     Shipping address confirmed in Epic.     Delivery Scheduled: Yes, Expected medication delivery date: 03/30/21.     Medication will be delivered via UPS to the prescription address in Epic WAM.    Elizabeth Browning   [facility] Specialty Technician

## 2021-03-29 NOTE — Unmapped (Signed)
Quita Skye 's Simponi shipment will be delayed as a result of insufficient inventory of the drug.     I have reached out to the patient  at [phone] and communicated the delay. We will reschedule the medication for the delivery date that the patient agreed upon.  We have confirmed the delivery date as 04/03/21, via ups.

## 2021-03-30 MED FILL — SIMPONI 100 MG/ML SUBCUTANEOUS PEN INJECTOR: 28 days supply | Qty: 2 | Fill #5

## 2021-04-01 NOTE — Unmapped (Signed)
Note copied from most recent clinical encounter for OR purposes. Will repeat exam in the preoperative area.     Patient has urine nicotine screening test in July that was negative.    CC:     HPI:  Elizabeth Browning??is a 55 y.o.??female??with past medical history of HS to bilateral buttocks, hurley Stage 3 s/p excision of right buttock HS on 03/09/19 that healed well??who is presents today for follow up after left buttock excision on 09/13/2019.??  ??  She notes no pain at this time.  She is healing well and notes that she is interested in discussing other areas for excision, starting with her breasts.  These are large in volume, the sweating from skin contact makes the flares worse.  She utilized meticulous hygiene, but despite this the areas still drain.  She is interested in eradicating the disease, so has no goals for breast size, only whatever is necessary to decrease the burden of disease.      Past Medical History:  Past Medical History:   Diagnosis Date   ??? Anesthesia to pain     difficulty relaxing and letting anesthesia work- anxiety noted   ??? Anxiety    ??? Arthritis    ??? Asthma    ??? Cancer (CMS-HCC)     cervical   ??? Chronic bronchitis (CMS-HCC)    ??? Chronic hepatitis C without hepatic coma (CMS-HCC)     cleared as of 10/25/16 per patient from [facility]   ??? Colon polyp    ??? Difficult intravenous access     requesting site to be numbed   ??? DOE (dyspnea on exertion)    ??? Dysthymia (RAF-HCC)    ??? Fatigue    ??? Foot pain    ??? GERD (gastroesophageal reflux disease) (RAF-HCC)    ??? Hepatitis C    ??? Loosening of knee joint prosthesis (CMS-HCC)     left   ??? Migraine    ??? MRSA (methicillin resistant staph aureus) culture positive     no + culture found- pt denies-current culture negative   ??? Neuropathic pain    ??? URI (upper respiratory infection)    ??? Vitamin D deficiency        Past Surgical History:  Past Surgical History:   Procedure Laterality Date   ??? BUNIONECTOMY Bilateral    ??? CARPOMETACARPEL SUSPENSION PLASTY Bilateral     tendon repair   ??? HAND SURGERY Bilateral     trigger finger   ??? HYSTERECTOMY      partial   ??? JOINT REPLACEMENT Left     knee   ??? KNEE SURGERY Left     revision 8 months later post TKR   ??? MOUTH SURGERY      all teeth removed   ??? PR EXC SWEAT GLAND LESN PERINEAL,SIMPL Right 03/09/2019    Procedure: R21 - EXCISION SKIN/SUBCUTANEOUS TISSUE FOR HIDRADENITIS, PERIANAL, PERINEAL, UMBILICAL; SIMPLE/INTERMEDIATE REPR;  Surgeon: Arsenio Katz, MD;  Location: MAIN OR [facility];  Service: Plastics   ??? PR EXC SWEAT GLAND LESN PERINEAL,SIMPL Left 09/14/2019    Procedure: R24, EXCISION SKIN/SUBCUTANEOUS TISSUE FOR HIDRADENITIS, PERIANAL, PERINEAL, UMBILICAL; SIMPLE/INTERMEDIATE REPR;  Surgeon: Arsenio Katz, MD;  Location: MAIN OR [facility];  Service: Plastics   ??? PR INCIS/DRAIN THIGH/KNEE ABSCESS,DEEP Left 11/11/2016    Procedure: I&D DEEP ABSCESS INFEC BURSA/HEMATOMA THIGH/KNEE;  Surgeon: Lowell Bouton, MD;  Location: OR [facility];  Service: Orthopedics   ??? PR REVISE KNEE JOINT REPLACE,ALL PARTS Left 11/04/2016  Procedure: REVISION LEFT TOTAL KNEE REPLACEMENT, FEMORAL AND TIBIAL COMPARTMENT;  Surgeon: Lowell Bouton, MD;  Location: OR [facility];  Service: Orthopedics   ??? TOE AMPUTATION Right     pinky    ??? TONSSILLECTOMY     ??? TUBAL LIGATION     ??? UMBILICAL HERNIA REPAIR      age 26       Medications:  Current Outpatient Medications   Medication Sig Dispense Refill   ??? ADVAIR DISKUS 250-50 mcg/dose diskus INHALE 1 PUFF BY MOUTH TWICE DAILY 12 HOURS APART     ??? albuterol HFA 90 mcg/actuation inhaler Inhale 2 puffs every four (4) hours as needed.     ??? atorvastatin (LIPITOR) 40 MG tablet Take 1 tablet by mouth in the morning.     ??? DEXILANT 60 mg capsule TK 1 C PO QAM FOR HEARTBURN AND ABDOMINAL PAIN     ??? empty container Misc use as directed 1 each 2   ??? empty container Misc Use as directed 1 each 2   ??? gabapentin (NEURONTIN) 300 MG capsule Take 3 capsules (900 mg total) by mouth Three (3) times a day for 14 days. 126 capsule 0   ??? golimumab (SIMPONI) 100 mg/mL PnIj Inject the contents of 2 pens (200mg ) under the skin on week 0 and week 2 as loading doses. 4 mL 0   ??? golimumab 100 mg/mL PnIj Inject the contents of 2 pens (200mg ) under the skin every 4 weeks as maintenance. 2 mL 11   ??? HUMIRA PEN CITRATE FREE 40 MG/0.4 ML Inject the contents of 2 pens (80mg ) under the skin every 7 days as directed by prescriber (Patient not taking: Reported on 01/09/2021) 8 each 11   ??? hydroquinone 4 % cream Apply topically Two (2) times a day. Use for up to 12 weeks 30 g 3   ??? ibuprofen (MOTRIN) 800 MG tablet Take 800 mg by mouth every six (6) hours as needed.      ??? ipratropium-albuteroL (DUO-NEB) 0.5-2.5 mg/3 mL nebulizer USE 1 VIAL VIA NEBULIZER EVERY 4 TO 6 HOURS AS NEEDED FOR COUGH OR WHEEZING     ??? oxyCODONE-acetaminophen (PERCOCET) 10-325 mg per tablet Take 1 tablet by mouth every four (4) hours as needed for pain. 10 tablet 0   ??? pantoprazole (PROTONIX) 20 MG tablet Take 40 mg by mouth in the morning.     ??? pregabalin (LYRICA) 50 MG capsule Take 1 capsule (50 mg total) by mouth Three (3) times a day. 90 capsule 2   ??? PREMARIN 0.625 mg/gram vaginal cream      ??? spironolactone (ALDACTONE) 50 MG tablet Take 50 mg daily for 1 week, and increase to 100 mg daily. 60 tablet 5   ??? traZODone (DESYREL) 50 MG tablet Take 50-100 mg by mouth nightly.      ??? URO-MP 118-10-40.8-36 mg cap TAKE 1 CAPSULE BY MOUTH FOUR TIMES DAILY AS NEEDED (Patient not taking: Reported on 01/09/2021)       No current facility-administered medications for this visit.       Allergies:  Allergies   Allergen Reactions   ??? Buprenorphine Hcl Hives     Unknown-pt denies   ??? Codeine Rash, Hives and Itching   ??? Adhesive Rash   ??? Iodinated Contrast Media Rash   ??? Latex Rash   ??? Morphine Itching   ??? Opioids - Morphine Analogues Itching     Unknown-pt denies  Family History:   Family History   Problem Relation Age of Onset   ??? Hypertension Mother    ??? Diabetes Mother    ??? Hypertension Father    ??? Cancer Father         Leukemia   ??? Cancer Sister         breast    The patient reports no family history for bleeding disorders or anesthetic problems.    Social History:  Social History     Socioeconomic History   ??? Marital status: Single   Tobacco Use   ??? Smoking status: Former Smoker     Packs/day: 0.50     Years: 30.00     Pack years: 15.00     Types: Cigarettes   ??? Smokeless tobacco: Never Used   ??? Tobacco comment: started smoking age as a teen   Substance and Sexual Activity   ??? Alcohol use: No   ??? Drug use: No   Other Topics Concern   ??? Do you use sunscreen? No   ??? Tanning bed use? No   ??? Are you easily burned? No   ??? Excessive sun exposure? No   ??? Blistering sunburns? No       ROS:   Otherwise, 12 point review of systems was completed and is negative except as per HPI.      Vitals:   There were no vitals filed for this visit.  ?  Physical Exam:   Gen: AA in NAD  HEENT: NCAT, EOMI, PERRL, Non icteric sclerae, MMM  CV: RRR  RESP: quiet respirations on room air  EXT: warm and well perfused  BUTTOCKS: R buttock s/p excision well healed. L buttock wound with healthy granulation tissue. ??No??drainage or erythema. ??About 6 mm of new skin growth required for full closure  BREAST: bilateral pendulous breasts, with evidence of HS present up to above the NAC.  No present drainage with pressure  ?  Impression and Plan:  Kyrin Garn is here today to discuss possible BRM for decrease in disease burden of HS.  -Would recommend SM pedicle  -Discussed this may leave some of the higher lesions behind.    -Will submit to insurance        No follow-ups on file.'

## 2021-04-06 ENCOUNTER — Encounter: Admit: 2021-04-06 | Discharge: 2021-04-07 | Payer: MEDICAID | Attending: Anesthesiology | Primary: Anesthesiology

## 2021-04-06 ENCOUNTER — Ambulatory Visit: Admit: 2021-04-06 | Discharge: 2021-04-07 | Payer: MEDICAID

## 2021-04-06 MED ORDER — CYCLOBENZAPRINE 5 MG TABLET
ORAL_TABLET | Freq: Three times a day (TID) | ORAL | 0 refills | 7.00000 days | Status: CP
Start: 2021-04-06 — End: 2021-04-13
  Filled 2021-04-07: qty 42, 7d supply, fill #0

## 2021-04-06 MED ORDER — PREGABALIN 50 MG CAPSULE
ORAL_CAPSULE | Freq: Three times a day (TID) | ORAL | 0 refills | 10.00000 days | Status: CP
Start: 2021-04-06 — End: 2021-04-16
  Filled 2021-04-07: qty 30, 10d supply, fill #0

## 2021-04-06 MED ORDER — OXYCODONE 5 MG TABLET
ORAL_TABLET | ORAL | 0 refills | 4.00000 days | Status: CP | PRN
Start: 2021-04-06 — End: 2021-04-11
  Filled 2021-04-07: qty 20, 4d supply, fill #0

## 2021-04-06 MED ADMIN — fentaNYL (PF) (SUBLIMAZE) injection 25 mcg: 25 ug | INTRAVENOUS | @ 22:00:00 | Stop: 2021-04-06

## 2021-04-06 MED ADMIN — neostigmine (BLOXIVERZ) injection: INTRAVENOUS | @ 17:00:00 | Stop: 2021-04-06

## 2021-04-06 MED ADMIN — propofoL (DIPRIVAN) injection: INTRAVENOUS | @ 16:00:00 | Stop: 2021-04-06

## 2021-04-06 MED ADMIN — midazolam (VERSED) injection: INTRAVENOUS | @ 16:00:00 | Stop: 2021-04-06

## 2021-04-06 MED ADMIN — phenylephrine 0.8 mg/10 mL (80 mcg/mL) injection: INTRAVENOUS | @ 17:00:00 | Stop: 2021-04-06

## 2021-04-06 MED ADMIN — fentaNYL (PF) (SUBLIMAZE) injection: INTRAVENOUS | @ 18:00:00 | Stop: 2021-04-06

## 2021-04-06 MED ADMIN — bupivacaine-epinephrine (PF) (MARCAINE-PF w/EPI) 0.25 %-1:200,000 injection (PF): @ 17:00:00 | Stop: 2021-04-06

## 2021-04-06 MED ADMIN — phenylephrine 0.8 mg/10 mL (80 mcg/mL) injection: INTRAVENOUS | @ 16:00:00 | Stop: 2021-04-06

## 2021-04-06 MED ADMIN — dexamethasone (DECADRON) 4 mg/mL injection: INTRAVENOUS | @ 17:00:00 | Stop: 2021-04-06

## 2021-04-06 MED ADMIN — glycopyrrolate (ROBINUL) injection: INTRAVENOUS | @ 17:00:00 | Stop: 2021-04-06

## 2021-04-06 MED ADMIN — ROCuronium (ZEMURON) injection: INTRAVENOUS | @ 16:00:00 | Stop: 2021-04-06

## 2021-04-06 MED ADMIN — succinylcholine (ANECTINE) injection: INTRAVENOUS | @ 16:00:00 | Stop: 2021-04-06

## 2021-04-06 MED ADMIN — oxyCODONE (ROXICODONE) immediate release tablet 5 mg: 5 mg | ORAL | @ 18:00:00 | Stop: 2021-04-06

## 2021-04-06 MED ADMIN — ePHEDrine (PF) 25 mg/5 mL (5 mg/mL) in 0.9% sodium chloride syringe Syrg: INTRAVENOUS | @ 16:00:00 | Stop: 2021-04-06

## 2021-04-06 MED ADMIN — fentaNYL (PF) (SUBLIMAZE) injection 25 mcg: 25 ug | INTRAVENOUS | @ 18:00:00 | Stop: 2021-04-06

## 2021-04-06 MED ADMIN — sodium chloride irrigation (NS) 0.9 % irrigation solution: @ 17:00:00 | Stop: 2021-04-06

## 2021-04-06 MED ADMIN — lactated Ringers infusion: 10 mL/h | INTRAVENOUS | @ 14:00:00

## 2021-04-06 MED ADMIN — thromb,hm-fibrin-aprotin,s-Ca 10 mL syringe: TOPICAL | @ 17:00:00 | Stop: 2021-04-06

## 2021-04-06 MED ADMIN — diphenhydrAMINE (BENADRYL) capsule/tablet 25 mg: 25 mg | ORAL | @ 19:00:00 | Stop: 2021-04-06

## 2021-04-06 MED ADMIN — ceFAZolin in 50ml iso-osmotic dextrose (ANCEF) 2 gram/50 mL IVPB: INTRAVENOUS | @ 17:00:00 | Stop: 2021-04-06

## 2021-04-06 NOTE — Unmapped (Signed)
Brief Operative Note  (CSN: [id number])      Date of Surgery: 04/06/2021    Pre-op Diagnosis: hidradenitis    Post-op Diagnosis: Hidradenitis [L73.2]  Hidradenitis suppurativa [L73.2]    Procedure(s):  EXCISION OF SKIN & SUBCUTANEOUS TISSUE FOR HIDRADENITIS, PERIANAL, PERINEAL, OR UMBILICAL; COMPLEX REPAIR: 11471 (CPT??)  Note: Revisions to procedures should be made in chart - see Procedures activity.    Performing Service: Librarian, academic) and Role:     * Arsenio Katz, MD - Primary     * Levan Hurst, MD - Resident - Assisting     * Evette Cristal, MD - Resident - Assisting    Assistant: None    Findings: successful excision of sacral hidradenitis lesions and left posterior thigh hidradenitis lesions.    Anesthesia: General    Estimated Blood Loss: 5ml    Complications: None    Specimens:   ID Type Source Tests Collected by Time Destination   1 : Left Thigh Hydradenitis Tissue Thigh, Left SURGICAL PATHOLOGY EXAM Arsenio Katz, MD 04/06/2021 1259    2 : Buttocks area Tissue Back SURGICAL PATHOLOGY EXAM Arsenio Katz, MD 04/06/2021 1303        Implants: * No implants in log *    Surgeon Notes: I was present and scrubbed for the entire procedure    Levan Hurst   Date: 04/06/2021  Time: 1:07 PM

## 2021-04-06 NOTE — Unmapped (Signed)
Discharge Summary    Admit date: 04/06/2021    Discharge date and time: 04/07/21***    Discharge to:  Home    Discharge Service: Surg Plastic [facility])    Discharge Attending Physician: Arsenio Katz, *    Discharge  Diagnoses: Hidradenitis suppurativa    Secondary Diagnosis: Active Problems:    Hidradenitis suppurativa POA: Unknown    Hidradenitis POA: Unknown  Resolved Problems:    * No resolved hospital problems. *      OR Procedures:    Bilateral - EXCISION OF SKIN & SUBCUTANEOUS TISSUE FOR HIDRADENITIS, PERIANAL, PERINEAL, OR UMBILICAL; COMPLEX REPAIR  Date  04/06/2021  -------------------     Ancillary Procedures: no procedures    Discharge Day Services: The patient was seen on the day of discharge by the Plastic Surgery team. VS and assessments were stable. All discharge instructions were reviewed and all questions were answered. Wound care was taught and reviewed by the nursing staff prior to discharge. See below for assessment specifics    Subjective   No acute events overnight. Pain Controlled. No fever or chills.    Objective   Patient Vitals for the past 8 hrs:   BP Temp Temp src Pulse SpO2 Pulse Resp SpO2 Weight   04/06/21 1430 129/65 36.5 ??C (97.7 ??F) Temporal 69 69 16 100 % --   04/06/21 1415 144/86 -- -- 75 75 13 99 % --   04/06/21 1400 131/74 36 ??C (96.8 ??F) Temporal 86 84 22 97 % --   04/06/21 1345 113/89 36 ??C (96.8 ??F) Temporal 99 99 24 100 % --   04/06/21 1330 119/100 36 ??C (96.8 ??F) Temporal 89 98 27 97 % --   04/06/21 1325 142/81 36 ??C (96.8 ??F) Temporal 82 85 16 100 % --   04/06/21 0939 126/60 36 ??C (96.8 ??F) Temporal 82 -- 20 98 % (!) 108.2 kg (238 lb 8 oz)     No intake/output data recorded.    General Appearance:   No acute distress  Lungs:                Clear to auscultation bilaterally  Heart:                           Regular rate and rhythm  Abdomen:                Soft, non-tender, non-distended  Extremities:              Warm and well perfused    Sacral wound and left posterior thigh wound are hemostatic with healthy fat in the base of the wound, SS drainage, No signs of infection.    Hospital Course:    Elizabeth Browning is a 55 y.o. female with history of HS that was admitted to the hospital on 04/06/2021 for excision of sacral and left posterior thigh hidradenitis lesions.      She did well postoperatively. She was given a regular diet post-op, which she tolerated well. The patient was able to void spontaneously.  Pain was controlled with P.O. pain medication.  The patient returned to prior activity level. She is being discharged on 04/06/21 (POD ***) to home*** in stable condition with planned outpatient follow-up.      Condition at Discharge: Improved  Discharge Medications:      Medication List      START taking these medications    ??? cyclobenzaprine 5 MG  tablet; Commonly known as: FLEXERIL; Take 2 tablets   (10 mg total) by mouth Three (3) times a day for 7 days.  ??? oxyCODONE 5 MG immediate release tablet; Commonly known as: ROXICODONE;   Take 1 tablet (5 mg total) by mouth every four (4) hours as needed for   pain for up to 5 days.     CONTINUE taking these medications    ??? ADVAIR DISKUS 250-50 mcg/dose diskus; Generic drug: fluticasone   propion-salmeteroL  ??? albuterol 90 mcg/actuation inhaler; Commonly known as: PROVENTIL   HFA;VENTOLIN HFA  ??? atorvastatin 40 MG tablet; Commonly known as: LIPITOR  ??? DEXILANT 60 mg capsule; Generic drug: dexlansoprazole  ??? * empty container Misc; use as directed  ??? * empty container Misc; Use as directed  ??? HUMIRA(CF) PEN 40 mg/0.4 mL injection; Generic drug: adalimumab; Inject   the contents of 2 pens (80mg ) under the skin every 7 days as directed by   prescriber  ??? hydroquinone 4 % cream; Apply topically Two (2) times a day. Use for up   to 12 weeks  ??? ibuprofen 800 MG tablet; Commonly known as: MOTRIN  ??? ipratropium-albuteroL 0.5-2.5 mg/3 mL nebulizer; Commonly known as:   DUO-NEB  ??? pantoprazole 20 MG tablet; Commonly known as: PROTONIX  ??? pregabalin 50 MG capsule; Commonly known as: LYRICA; Take 1 capsule (50   mg total) by mouth Three (3) times a day for 10 days.  ??? PREMARIN 0.625 mg/gram vaginal cream; Generic drug: conjugated estrogens  ??? * SIMPONI 100 mg/mL Pnij; Generic drug: golimumab; Inject the contents   of 2 pens (200mg ) under the skin on week 0 and week 2 as loading doses.  ??? * SIMPONI 100 mg/mL Pnij; Generic drug: golimumab; Inject the contents   of 2 pens (200mg ) under the skin every 4 weeks as maintenance.  ??? spironolactone 50 MG tablet; Commonly known as: ALDACTONE; Take 50 mg   daily for 1 week, and increase to 100 mg daily.  ??? traZODone 50 MG tablet; Commonly known as: DESYREL  ??? URO-MP 118-10-40.8-36 mg Cap; Generic drug:   methen-m.blue-s.phos-phsal-hyo  * This list has 4 medication(s) that are the same as other medications   prescribed for you. Read the directions carefully, and ask your doctor or   other care provider to review them with you.     STOP taking these medications    ??? gabapentin 300 MG capsule; Commonly known as: NEURONTIN  ??? oxyCODONE-acetaminophen 10-325 mg per tablet; Commonly known as:   PERCOCET       Pending Test Results:     Discharge Instructions:  Activity:     Diet:    Other Instructions:  Other Instructions     Discharge instructions      [facility]  Discharge Instructions for Elizabeth Browning, M.D.        Procedure performed: excision of hidradenitis lesions      Medications:    Antibiotics:  - You do not require antibiotics after surgery.    Pain and pain medication:  - You have been prescribed a narcotic pain medication. Please take only as directed/prescribed. Do not drive, operate heavy machinery, or make important decisions while taking these medications. Do not drink alcohol or take illegal drugs while on narcotic pain medication. These medications may cause constipation, itching, and nausea.  - You may alternate acetaminophen (Tylenol) and ibuprofen (Advil or Motrin), both available over-the-counter for pain relief. Follow the instructions on the bottle. Do  not take more than 4 grams (4,000 mg) of acetaminophen/Tylenol per day.  - You have been given a muscle relaxant called Flexeril to help with pain control. Take as directed.    Stool softeners:  - Prescription narcotic pain medication, such as oxycodone, Percocet, or Vicodin, can cause constipation. Anaesthesia may also cause constipation. If you become constipated after surgery, you should take polyethylene glycol (Miralax) or sennokot (Senna) to help you have regular bowel movements. Follow the instructions on the bottle.  - If you are constipated after surgery, you should ensure that you have adequate fiber in your diet (>25 grams per day) and drink at least 64oz of water daily. If you become constipated even after using these medications, you may take magnesium citrate. Follow the instructions on the bottle.      Home medications:  - You should restart your home medications.         Restrictions:    Activity Restrictions:  - Do not exercise, No exercise, no strenuous activity, no yoga. Do not lift any objects heavier than 5 lbs.  - No housework, yardwork, dishwashing, lawn mowing, shovelling, sweeping, Swiffering, cleaning the counters, gardening, dog-walking, swimming, exercising of any kind, yoga, Pilates.  - Walking is encouraged. Do not power-walk, but you may go for gentle walks.    What foods can I eat?:  Normal: You may resume eating normal foods. You do not have any food restrictions.    Showering and Bathing:  - You may shower 24 hours after surgery. When you shower, allow warm water and soap to run over the surgical site. Dab the area dry, but do not scrub the area. Keep the surgical site clean and dry at all times otherwise.  - Absolutely no submerging under water. No baths, no hot tubs, no swimming, no pools, no lakes, no rivers, no oceans, etc. Doing so will significantly increase your risk of infection.        Wounds and Dressings:    Wounds and Dressings:  - You have a wet-to-dry dressing in place. You should change this dressing two times per day.  - Keep your dressing(s) clean and dry. If you see some light bloody wound seepage through the bandage, do not worry as this is normal. Replace your dressings if needed.  - Continue dressing the site(s) of surgery until your follow up appointment.      When should I call my surgeon?:    Please call if you develop any of the following symptoms:  - Fever, i.e. temperature greater than 101.3??F (or 38.5??C) for adults, or 100.4??F (or 38.0??C) for children  - Persistent nausea or vomiting that does not go away  - Severe pain that cannot be controlled with prescription and over-the-counter pain medication  - Rapidly increasing swelling of at the site of surgery  - Redness, tenderness, foul odor, or thick green or yellow discharge from a surgical site  - Large amounts of blood coming from your incisions or surgical sites  - Any other concerning symptom      APPOINTMENTS / QUESTIONS  For questions regarding appointments or your surgical care, Monday through Friday, 8 am to 4:30 pm, please call our clinics    [facility]  [address],   [place] [address]  T: [phone]  F: [phone]    [facility]  [address] [place], [place] [address]   [facility]  T: [phone]  F: [phone]  [facility]  [address] [place], [place] [address]  T: [phone]  F: [phone]    [facility]  [address] [place], [place] [address]  Third Floor, [address]  T: [phone]   F: [phone]      Physicians:  Elizabeth Bosch MD:   Nurse:  Elizabeth Browning BSN, RN, CPSN [phone] [email or web address]  Nurse:  Elizabeth Browning BSN, RN, [place]  [phone] [email or web address]  Surgery Scheduler: Elizabeth Browning  [phone] [email or web address]     Elizabeth Sarin MD:   Nurse:  Elizabeth Browning BSN, RN, CPSN [phone] [email or web address]   Nurse:  Elizabeth Browning BAN, RN, CPSN  [phone]  [email or web address]    Surgery Scheduler: Elizabeth Browning  [phone] [email or web address]    Elizabeth Kos MD:   Nurse:  Elizabeth Browning BSN, RN, CPSN [phone] [email or web address]  Surgery Scheduler:   Elizabeth Browning  [phone] [email or web address]    Elizabeth Clydie Braun MD:  Nurse: Elizabeth Browning BSN, RN, [facility]  [phone] [email or web address]  Surgery Scheduler: Kristine Royal [phone] [email or web address]    Elizabeth Cleaver MD:  Nurse: Elizabeth Browning BSN, RN, CPSN  [phone]  [email or web address]  Surgery Scheduler: Kristine Royal [phone] [email or web address]    Nurse Practitioner:  Elizabeth Slocumb, DNP, NP-C:   Nurse: Elizabeth Browning BSN, RN, CPSN  [phone]  [email or web address]  Surgery Scheduler: Kristine Royal [phone] [email or web address]        FOR URGENT ISSUES AFTER HOURS/HOLIDAYS:  For urgent issues after-hours (after 5pm, or weekends): call the [facility] operator [phone]) and ask to page the Plastic Surgery resident on call. You will be directed to a surgery resident who likely is not immediately aware of the details of your case, but can help you deal with any emergencies that cannot wait until regular business hours.  Please be aware that this person is responding to many in-hospital emergencies and patient issues and may not answer your phone call immediately, but will return your call as soon as possible.    For emergencies, please call 911 or report to your nearest emergency department.             Follow-Up Appointments:  Your follow-up appointment has already been scheduled. Please see your discharge paperwork for the time and date. To change your follow-up appointment time, please call the Plastic Surgery clinic at [phone].        Labs and Other Follow-ups after Discharge:  Follow Up instructions and Outpatient Referrals     Discharge instructions          Future Appointments:  Appointments which have been scheduled for you    Apr 12, 2021 11:15 AM  (Arrive by 11:00 AM)  RETURN  GENERAL with Arsenio Katz, MD  [facility] PLASTIC AND RECONSTRUCTIVE SURGERY AT [facility]) [address]  [address]  [place] [address]  [phone]      May 03, 2021  1:30 PM  (Arrive by 1:15 PM)  RETURN  GENERAL with Arsenio Katz, MD  [facility] PLASTIC AND RECONSTRUCTIVE SURGERY AT [facility]) [address]  [address]  [place] [address]  [phone]

## 2021-04-06 NOTE — Unmapped (Signed)
Date: 04/06/2021  ??  Preoperative Diagnosis: Buttock hidradenitis; left thigh hidradenitis  Postoperative Diagnosis: Same  ??  Procedure performed: excision of buttock/sacral hidradenitis measuring 12 cm x 8 cm; left thigh hidradenitis excision measuring 15x 10 cm  ??  Staff: Lamarr Lulas, MD  Resident: Audrie Lia, MD; Evette Cristal, MD  ??  Blood Loss: 10 mL  ??  Anesthesia: General with local 0.25% marcaine with epinephrine  ??  Specimen: sacral hidradenitis, left thigh hidradenitis  ??  Indications: Ms Krempasky is a 55 y/o F with a history of hidradenitis, who presented to clinic to discuss options for surgical intervention. ??We discussed a staged approach, and we she presents today for excision of as her preference.  ??  Description of the Procedure: Ms Pugsley??was identified in the preoperative holding area, where her??history and physical exam were updated. ??She was marked,??with the areas of concern confirmed with the patient,??and??was then transported back to the operating room where??she was positioned supine??on her stretcher.????A time out was called and the appropriate patient, procedure, and laterality were confirmed. ??General endotracheal anesthesia was then induced via the anesthesia team.????Ms Hubers was then turned on to the OR bed in the prone position.????Preoperative antibiotics were assured to be administered. ??All pressure points were assured to be padded. ??The??buttock was??prepped and draped in the usual sterile fashion.  ??  The area of drainage and scars from previous drainage had been marked with the patient in preop.????The periphery of the planned incision was infiltrated with local anesthesia.????A 15 blade scalpel was used to incise the skin and the cautery was then used to complete the dissection. ??The tissues were palpated during the excision, with normal soft and healthy appearing tissue as the conclusion of the dissection. ??The tissue was measured and passed off the table as permanent specimen.????The excision??area??was then??irrigated with normal saline. ??  ??  The excision areas were assessed again, with no obvious remaining diseased tissue.????The area was large and could not be closed primarily. ??Additional local was infiltrated in to the base of the wound and the periphery.  Tisseal was sprayed at the base of the wound. ??A dressing of wet to dry kerlix, followed by??ABD pads and mesh underwear was then applied. ??  ??  She was awakened from anesthesia and transported to the PACU in stable condition. ??All instrument counts were correct at the conclusion of the case.

## 2021-04-07 MED ORDER — FLUCONAZOLE 150 MG TABLET
ORAL_TABLET | Freq: Once | ORAL | 0 refills | 1 days | Status: CP
Start: 2021-04-07 — End: 2021-04-08
  Filled 2021-04-07: qty 1, 7d supply, fill #0

## 2021-04-07 MED ORDER — DIPHENHYDRAMINE 25 MG CAPSULE
ORAL_CAPSULE | Freq: Four times a day (QID) | ORAL | 0 refills | 13 days | Status: CP | PRN
Start: 2021-04-07 — End: 2021-04-21
  Filled 2021-04-07: qty 100, 13d supply, fill #0

## 2021-04-07 MED ADMIN — ceFAZolin (ANCEF) IVPB 2 g in 50 ml dextrose (premix): 2 g | INTRAVENOUS | @ 11:00:00 | Stop: 2021-04-07

## 2021-04-07 MED ADMIN — oxyCODONE (ROXICODONE) immediate release tablet 10 mg: 10 mg | ORAL | @ 13:00:00 | Stop: 2021-04-07

## 2021-04-07 MED ADMIN — cyclobenzaprine (FLEXERIL) tablet 10 mg: 10 mg | ORAL | @ 13:00:00 | Stop: 2021-04-07

## 2021-04-07 MED ADMIN — diphenhydrAMINE (BENADRYL) capsule/tablet 25 mg: 25 mg | ORAL | @ 03:00:00

## 2021-04-07 MED ADMIN — ceFAZolin (ANCEF) IVPB 2 g in 50 ml dextrose (premix): 2 g | INTRAVENOUS | @ 02:00:00 | Stop: 2021-04-07

## 2021-04-07 MED ADMIN — oxyCODONE (ROXICODONE) immediate release tablet 10 mg: 10 mg | ORAL | @ 07:00:00 | Stop: 2021-04-07

## 2021-04-07 MED ADMIN — pregabalin (LYRICA) capsule 50 mg: 50 mg | ORAL | @ 13:00:00 | Stop: 2021-04-07

## 2021-04-07 MED ADMIN — acetaminophen (TYLENOL) tablet 1,000 mg: 1000 mg | ORAL | @ 02:00:00

## 2021-04-07 MED ADMIN — cyclobenzaprine (FLEXERIL) tablet 10 mg: 10 mg | ORAL | @ 01:00:00

## 2021-04-07 MED ADMIN — acetaminophen (TYLENOL) tablet 1,000 mg: 1000 mg | ORAL | @ 10:00:00 | Stop: 2021-04-07

## 2021-04-07 MED ADMIN — diphenhydrAMINE (BENADRYL) capsule 50 mg: 50 mg | ORAL | @ 13:00:00 | Stop: 2021-04-07

## 2021-04-07 MED ADMIN — diphenhydrAMINE (BENADRYL) capsule/tablet 25 mg: 25 mg | ORAL | @ 09:00:00 | Stop: 2021-04-07

## 2021-04-07 MED ADMIN — pregabalin (LYRICA) capsule 50 mg: 50 mg | ORAL | @ 03:00:00

## 2021-04-07 MED ADMIN — oxyCODONE (ROXICODONE) immediate release tablet 10 mg: 10 mg | ORAL | @ 01:00:00 | Stop: 2021-04-20

## 2021-04-07 NOTE — Unmapped (Signed)
Pt alert and oriented, room air, transferred from PACU, s/p incision and drainage of mid buttocks and left upper posterior thigh with wet to dry with kerlix and abd pad, able to eat her meal, given pain med and voided prior to transfer, no s/s of distress at this time.  Problem: Impaired Wound Healing  Goal: Optimal Wound Healing  Outcome: Progressing     Problem: Impaired Wound Healing  Goal: Optimal Wound Healing  Outcome: Progressing     Problem: Latex Allergy  Goal: Absence of Allergy Symptoms  Outcome: Progressing     Problem: Latex Allergy  Goal: Absence of Allergy Symptoms  Outcome: Progressing     Problem: Fall Injury Risk  Goal: Absence of Fall and Fall-Related Injury  Outcome: Progressing

## 2021-04-07 NOTE — Unmapped (Signed)
Problem: Impaired Wound Healing  Goal: Optimal Wound Healing  04/07/2021 0543 by Osie Bond, RN  Outcome: Progressing  04/07/2021 0542 by Osie Bond, RN  Outcome: Progressing     Problem: Fall Injury Risk  Goal: Absence of Fall and Fall-Related Injury  04/07/2021 0543 by Osie Bond, RN  Outcome: Progressing  04/07/2021 0542 by Osie Bond, RN  Outcome: Progressing     Problem: Self-Care Deficit  Goal: Improved Ability to Complete Activities of Daily Living  04/07/2021 0543 by Osie Bond, RN  Outcome: Progressing  04/07/2021 0542 by Osie Bond, RN  Outcome: Progressing     Problem: Pain Acute  Goal: Acceptable Pain Control and Functional Ability  04/07/2021 0543 by Osie Bond, RN  Outcome: Progressing  04/07/2021 0542 by Osie Bond, RN  Outcome: Progressing     Problem: Adult Inpatient Plan of Care  Goal: Plan of Care Review  Outcome: Progressing  Goal: Patient-Specific Goal (Individualized)  Outcome: Progressing  Goal: Absence of Hospital-Acquired Illness or Injury  Outcome: Progressing  Goal: Optimal Comfort and Wellbeing  Outcome: Progressing

## 2021-04-09 MED ORDER — OXYCODONE 5 MG TABLET
ORAL_TABLET | Freq: Four times a day (QID) | ORAL | 0 refills | 3 days | Status: CP | PRN
Start: 2021-04-09 — End: ?

## 2021-04-11 NOTE — Unmapped (Unsigned)
CC: Post op    HPI:  Elizabeth Browning is a 55 y.o. female who presents for post op check after excision of??buttock/sacral hidradenitis measuring 12 cm x 8 cm; left thigh hidradenitis excision measuring 15x 10 cm on 04/06/21.     Since surgery,       Past Medical History:  Past Medical History:   Diagnosis Date   ??? Anesthesia to pain     difficulty relaxing and letting anesthesia work- anxiety noted   ??? Anxiety    ??? Arthritis    ??? Asthma    ??? Cancer (CMS-HCC)     cervical   ??? Chronic bronchitis (CMS-HCC)    ??? Chronic hepatitis C without hepatic coma (CMS-HCC)     cleared as of 10/25/16 per patient from [facility]   ??? Colon polyp    ??? Difficult intravenous access     requesting site to be numbed   ??? DOE (dyspnea on exertion)    ??? Dysthymia (RAF-HCC)    ??? Fatigue    ??? Foot pain    ??? GERD (gastroesophageal reflux disease) (RAF-HCC)    ??? Hepatitis C    ??? Loosening of knee joint prosthesis (CMS-HCC)     left   ??? Migraine    ??? MRSA (methicillin resistant staph aureus) culture positive     no + culture found- pt denies-current culture negative   ??? Neuropathic pain    ??? URI (upper respiratory infection)    ??? Vitamin D deficiency        Past Surgical History:  Past Surgical History:   Procedure Laterality Date   ??? BUNIONECTOMY Bilateral    ??? CARPOMETACARPEL SUSPENSION PLASTY Bilateral     tendon repair   ??? HAND SURGERY Bilateral     trigger finger   ??? HYSTERECTOMY      partial   ??? JOINT REPLACEMENT Left     knee   ??? KNEE SURGERY Left     revision 8 months later post TKR   ??? MOUTH SURGERY      all teeth removed   ??? PR EXC SWEAT GLAND LESN PERINEAL,COMPLX Bilateral 04/06/2021    Procedure: EXCISION OF SKIN & SUBCUTANEOUS TISSUE FOR HIDRADENITIS, PERIANAL, PERINEAL, OR UMBILICAL; COMPLEX REPAIR;  Surgeon: Arsenio Katz, MD;  Location: MAIN OR [facility];  Service: Plastics   ??? PR EXC SWEAT GLAND LESN PERINEAL,SIMPL Right 03/09/2019    Procedure: R21 - EXCISION SKIN/SUBCUTANEOUS TISSUE FOR HIDRADENITIS, PERIANAL, PERINEAL, UMBILICAL; SIMPLE/INTERMEDIATE REPR;  Surgeon: Arsenio Katz, MD;  Location: MAIN OR [facility];  Service: Plastics   ??? PR EXC SWEAT GLAND LESN PERINEAL,SIMPL Left 09/14/2019    Procedure: R24, EXCISION SKIN/SUBCUTANEOUS TISSUE FOR HIDRADENITIS, PERIANAL, PERINEAL, UMBILICAL; SIMPLE/INTERMEDIATE REPR;  Surgeon: Arsenio Katz, MD;  Location: MAIN OR [facility];  Service: Plastics   ??? PR INCIS/DRAIN THIGH/KNEE ABSCESS,DEEP Left 11/11/2016    Procedure: I&D DEEP ABSCESS INFEC BURSA/HEMATOMA THIGH/KNEE;  Surgeon: Lowell Bouton, MD;  Location: OR [facility];  Service: Orthopedics   ??? PR REVISE KNEE JOINT REPLACE,ALL PARTS Left 11/04/2016    Procedure: REVISION LEFT TOTAL KNEE REPLACEMENT, FEMORAL AND TIBIAL COMPARTMENT;  Surgeon: Lowell Bouton, MD;  Location: OR [facility];  Service: Orthopedics   ??? TOE AMPUTATION Right     pinky    ??? TONSSILLECTOMY     ??? TUBAL LIGATION     ??? UMBILICAL HERNIA REPAIR      age 21       Medications:  Current  Outpatient Medications   Medication Sig Dispense Refill   ??? ADVAIR DISKUS 250-50 mcg/dose diskus INHALE 1 PUFF BY MOUTH TWICE DAILY 12 HOURS APART     ??? albuterol HFA 90 mcg/actuation inhaler Inhale 2 puffs every four (4) hours as needed.     ??? atorvastatin (LIPITOR) 40 MG tablet Take 1 tablet by mouth in the morning.     ??? cyclobenzaprine (FLEXERIL) 5 MG tablet Take 2 tablets (10 mg total) by mouth Three (3) times a day for 7 days. 42 tablet 0   ??? DEXILANT 60 mg capsule TK 1 C PO QAM FOR HEARTBURN AND ABDOMINAL PAIN     ??? diphenhydrAMINE (BENADRYL) 25 mg capsule Take 2 capsules (50 mg total) by mouth every six (6) hours as needed for itching for up to 14 days. 100 capsule 0   ??? empty container Misc use as directed 1 each 2   ??? empty container Misc Use as directed 1 each 2   ??? golimumab (SIMPONI) 100 mg/mL PnIj Inject the contents of 2 pens (200mg ) under the skin on week 0 and week 2 as loading doses. 4 mL 0   ??? golimumab 100 mg/mL PnIj Inject the contents of 2 pens (200mg ) under the skin every 4 weeks as maintenance. 2 mL 11   ??? HUMIRA PEN CITRATE FREE 40 MG/0.4 ML Inject the contents of 2 pens (80mg ) under the skin every 7 days as directed by prescriber (Patient not taking: Reported on 01/09/2021) 8 each 11   ??? hydroquinone 4 % cream Apply topically Two (2) times a day. Use for up to 12 weeks 30 g 3   ??? ibuprofen (MOTRIN) 800 MG tablet Take 800 mg by mouth every six (6) hours as needed.      ??? ipratropium-albuteroL (DUO-NEB) 0.5-2.5 mg/3 mL nebulizer USE 1 VIAL VIA NEBULIZER EVERY 4 TO 6 HOURS AS NEEDED FOR COUGH OR WHEEZING     ??? oxyCODONE (ROXICODONE) 5 MG immediate release tablet Take 1 tablet (5 mg total) by mouth every six (6) hours as needed for pain. This medication is to be used during dressing changes only. 10 tablet 0   ??? pantoprazole (PROTONIX) 20 MG tablet Take 40 mg by mouth in the morning.     ??? pregabalin (LYRICA) 50 MG capsule Take 1 capsule (50 mg total) by mouth Three (3) times a day for 10 days. 30 capsule 0   ??? PREMARIN 0.625 mg/gram vaginal cream      ??? spironolactone (ALDACTONE) 50 MG tablet Take 50 mg daily for 1 week, and increase to 100 mg daily. 60 tablet 5   ??? traZODone (DESYREL) 50 MG tablet Take 50-100 mg by mouth nightly.      ??? URO-MP 118-10-40.8-36 mg cap TAKE 1 CAPSULE BY MOUTH FOUR TIMES DAILY AS NEEDED (Patient not taking: Reported on 01/09/2021)       No current facility-administered medications for this visit.       Allergies:  Allergies   Allergen Reactions   ??? Buprenorphine Hcl Hives     Unknown-pt denies   ??? Codeine Rash, Hives and Itching   ??? Adhesive Rash   ??? Iodinated Contrast Media Rash   ??? Latex Rash   ??? Morphine Itching   ??? Opioids - Morphine Analogues Itching     Unknown-pt denies         Family History:   Family History   Problem Relation Age of Onset   ??? Hypertension Mother    ???  Diabetes Mother    ??? Hypertension Father    ??? Cancer Father         Leukemia   ??? Cancer Sister         breast    The patient reports no family history for bleeding disorders or anesthetic problems.    Social History:  Social History     Socioeconomic History   ??? Marital status: Single   Tobacco Use   ??? Smoking status: Former Smoker     Packs/day: 0.50     Years: 30.00     Pack years: 15.00     Types: Cigarettes   ??? Smokeless tobacco: Never Used   ??? Tobacco comment: started smoking age as a teen   Substance and Sexual Activity   ??? Alcohol use: No   ??? Drug use: No   Other Topics Concern   ??? Do you use sunscreen? No   ??? Tanning bed use? No   ??? Are you easily burned? No   ??? Excessive sun exposure? No   ??? Blistering sunburns? No       ROS:   Otherwise, 12 point review of systems was completed and is negative except as per HPI.      Vitals: There were no vitals filed for this visit.  ?  Physical Exam:   Gen: AA in NAD  HEENT: NCAT, EOMI, PERRL, Non icteric sclerae, MMM  CV: RRR  RESP: quiet respirations on room air  EXT: warm and well perfused  ***  ?  Impression and Plan:  Elizabeth Browning is here today for post op check after excision of??buttock/sacral hidradenitis measuring 12 cm x 8 cm; left thigh hidradenitis excision measuring 15x 10 cm on 04/06/21.         No follow-ups on file.

## 2021-04-11 NOTE — Unmapped (Signed)
APPOINTMENTS / QUESTIONS  For questions regarding appointments or your surgical care, Monday through Friday, 8 am to 4:30 pm    [facility]  [address],   [place] [address]  T: [phone]  F: [phone]    Ambulatory Surgery Center  1st Floor, Ambulatory Care Center  [address]  [place], [place] [address]  T: [phone]  Website:  [facility]    [facility]  [address] [place], [place] [address]  Ground Floor [facility]  T: [phone]  F: [phone]    [facility]  [address], [place], [place] [address]  T: [phone]  F: [phone]    [facility]  [address] [place], [place] [address]   1st Floor [facility]  T: [phone]  F: [phone]    [facility]  [address], [place], [place] [address]  Third Floor, [address]  T: [phone]   F: [phone]    Administrative Office  [address]  [place], [place] [address]  T:  [phone]  F:  [phone]      Physicians:  Jackquline Bosch MD:   Nurse:  Elisabeth Pigeon BSN, RN, CPSN [phone] [email or web address]  Nurse:  Mila Palmer BSN, RN, [place]  [phone] [email or web address]  Surgery Scheduler: Lysle Morales  [phone] [email or web address]     [name] MD:   Nurse:  Vista Mink BSN, RN [phone]  [email or web address]   Surgery Scheduler: Lysle Morales  [phone] [email or web address]    Tarry Kos MD:   Nurse:  Elisabeth Pigeon BSN, RN, CPSN [phone] [email or web address]  Surgery Scheduler:   Lysle Morales  [phone] [email or web address]    Adeyemi Clydie Braun MD:  Nurse: Mila Palmer BSN, RN, [facility]  [phone] [email or web address]  Surgery Scheduler: Kristine Royal [phone] [email or web address]    Darleene Cleaver MD:  Nurse: Venetia Maxon BSN, RN, CPSN  [phone]  [email or web address]  Surgery Scheduler: Kristine Royal [phone] [email or web address]    Nurse Practitioner:  Ezekiel Slocumb, DNP, NP-C:   Nurse: Britt Boozer, MSN. RN [phone]  [email or web address]  Surgery Scheduler: Kristine Royal [phone] [email or web address]    Financial Counselor: Deloria Lair  T: [phone]  F: [phone]  E-mail:  [email or web address]     Surgery Scheduling:  You will receive a phone call from the surgery schedulers regarding date for surgery in 1-2 weeks from  your appointment.    Please call one of the above numbers if you have not received a phone call 2 weeks after your appointment with the provider.      [facility] Contact Information: Please call to schedule  *Radiology (CT, X-ray, Ultrasound) [phone]  *Mammography & Breast Ultrasound [phone]  *MRI [phone]  *Interventional Radiology [phone]    AFTER HOURS/HOLIDAYS:  For emergencies after-hours (after 5pm, or weekends): call the [facility] operator [phone]) and ask to page the Plastic Surgery resident on call. You will be directed to a surgery resident who likely is not immediately aware of the details of your case, but can help you deal with any emergencies that cannot wait until regular business hours.  Please be aware that this person is responding to many in-hospital emergencies and patient issues and may not answer your phone call immediately, but will return your call as soon as possible.    Billing Questions/Financial Navigation:  [phone]    Precare Location:  [facility] Pre-Procedure Services at [facility]   [phone]  [address]  [place], [place] [address] ([place] [facility] near [employer])    Blood Work Location:  [address], [place] [address]. There are no suite numbers, but it is located on the first floor there. Go to the main desk and tell them you are there for walk-in labs.  Precare:   The day prior to your scheduled surgery, Pre-Care will call you with instructions.  If you have not heard from them by 4PM, and would like to check on the status of your surgery, please call:  [facility]: [phone]  [facility]: [phone]  [facility]: [phone]  [facility]:  [phone]    Minor Procedure Room:  Please do not wear any jewelry to your procedure.    [facility] Hotline:  [phone]    [facility] forms and paperwork:  There is a $25 fee for each form that needs completed.  Please allow  a 2 week turn around for forms to be completed and faxed.

## 2021-04-12 ENCOUNTER — Ambulatory Visit: Admit: 2021-04-12 | Discharge: 2021-04-13 | Payer: MEDICAID

## 2021-04-12 DIAGNOSIS — L732 Hidradenitis suppurativa: Principal | ICD-10-CM

## 2021-04-12 NOTE — Unmapped (Signed)
Wet to dry,Saline, Kerlix, and ABD pads applied to wound. Per Dr Lafayette Dragon.

## 2021-04-13 NOTE — Unmapped (Signed)
Patient called asking where her refill for Oxycodone was that Dr. Lafayette Dragon said she would fill on 9/29. I told her I did not see it called in and I'd have to check with Dr. Lafayette Dragon because regarding post-acute narcotic guidelines, we may not be able to refill it. I'm checking with Dr. Lafayette Dragon. Patient is very upset.

## 2021-04-16 MED ORDER — OXYCODONE 5 MG TABLET
ORAL_TABLET | Freq: Three times a day (TID) | ORAL | 0 refills | 4.00000 days | Status: CP | PRN
Start: 2021-04-16 — End: ?

## 2021-04-16 NOTE — Unmapped (Signed)
Patient called wanting a referral to [facility] in [place] for dressing changes. Her former care giver is done. I downloaded the referral form from the [facility] website, filled it out and faxed it over.

## 2021-04-18 DIAGNOSIS — L732 Hidradenitis suppurativa: Principal | ICD-10-CM

## 2021-04-18 NOTE — Unmapped (Signed)
Elizabeth Browning reports her HS flares have gotten worse, and she's not sure Simponi is helping. At last visit, she was encouraged to continue therapy for a while longer. I encouraged her to make f/u appt and provided her with office number today.    She continues on Augmentin and spironolactone as adjunctive therapy.     [facility] Clinical Assessment & Refill Coordination Note    Elizabeth Browning, DOB: 19-Sep-1965  Phone: [phone] (home)     All above HIPAA information was verified with patient.     Was a Nurse, learning disability used for this call? No    Specialty Medication(s):   Inflammatory Disorders: Simponi     Current Outpatient Medications   Medication Sig Dispense Refill   ??? ADVAIR DISKUS 250-50 mcg/dose diskus INHALE 1 PUFF BY MOUTH TWICE DAILY 12 HOURS APART     ??? albuterol HFA 90 mcg/actuation inhaler Inhale 2 puffs every four (4) hours as needed.     ??? atorvastatin (LIPITOR) 40 MG tablet Take 1 tablet by mouth in the morning.     ??? DEXILANT 60 mg capsule TK 1 C PO QAM FOR HEARTBURN AND ABDOMINAL PAIN     ??? diphenhydrAMINE (BENADRYL) 25 mg capsule Take 2 capsules (50 mg total) by mouth every six (6) hours as needed for itching for up to 14 days. 100 capsule 0   ??? empty container Misc use as directed 1 each 2   ??? empty container Misc Use as directed 1 each 2   ??? golimumab (SIMPONI) 100 mg/mL PnIj Inject the contents of 2 pens (200mg ) under the skin on week 0 and week 2 as loading doses. 4 mL 0   ??? golimumab 100 mg/mL PnIj Inject the contents of 2 pens (200mg ) under the skin every 4 weeks as maintenance. 2 mL 11   ??? HUMIRA PEN CITRATE FREE 40 MG/0.4 ML Inject the contents of 2 pens (80mg ) under the skin every 7 days as directed by prescriber (Patient not taking: Reported on 01/09/2021) 8 each 11   ??? hydroquinone 4 % cream Apply topically Two (2) times a day. Use for up to 12 weeks 30 g 3   ??? ibuprofen (MOTRIN) 800 MG tablet Take 800 mg by mouth every six (6) hours as needed.      ??? ipratropium-albuteroL (DUO-NEB) 0.5-2.5 mg/3 mL nebulizer USE 1 VIAL VIA NEBULIZER EVERY 4 TO 6 HOURS AS NEEDED FOR COUGH OR WHEEZING     ??? oxyCODONE (ROXICODONE) 5 MG immediate release tablet Take 1 tablet (5 mg total) by mouth every eight (8) hours as needed for pain for up to 10 doses. 10 tablet 0   ??? pantoprazole (PROTONIX) 20 MG tablet Take 40 mg by mouth in the morning.     ??? pregabalin (LYRICA) 50 MG capsule Take 1 capsule (50 mg total) by mouth Three (3) times a day for 10 days. 30 capsule 0   ??? PREMARIN 0.625 mg/gram vaginal cream      ??? spironolactone (ALDACTONE) 50 MG tablet Take 50 mg daily for 1 week, and increase to 100 mg daily. 60 tablet 5   ??? traZODone (DESYREL) 50 MG tablet Take 50-100 mg by mouth nightly.      ??? URO-MP 118-10-40.8-36 mg cap TAKE 1 CAPSULE BY MOUTH FOUR TIMES DAILY AS NEEDED (Patient not taking: Reported on 01/09/2021)       No current facility-administered medications for this visit.        Changes to  medications: Keena reports no changes at this time.    Allergies   Allergen Reactions   ??? Buprenorphine Hcl Hives     Unknown-pt denies   ??? Codeine Rash, Hives and Itching   ??? Adhesive Rash   ??? Iodinated Contrast Media Rash   ??? Latex Rash   ??? Morphine Itching   ??? Opioids - Morphine Analogues Itching     Unknown-pt denies         Changes to allergies: No    SPECIALTY MEDICATION ADHERENCE     Simponi - 0 left     Specialty medication(s) dose(s) confirmed: Regimen is correct and unchanged.     Are there any concerns with adherence? No    Adherence counseling provided? Not needed    CLINICAL MANAGEMENT AND INTERVENTION      Clinical Benefit Assessment:    Do you feel the medicine is effective or helping your condition? No    Clinical Benefit counseling provided? Provider aware, patient will follow up with clinic    Adverse Effects Assessment:    Are you experiencing any side effects? No    Are you experiencing difficulty administering your medicine? No    Quality of Life Assessment:    Quality of Life    Rheumatology  Oncology  Dermatology  Cystic Fibrosis          Have you discussed this with your provider? Yes    Acute Infection Status:    Acute infections noted within Epic:  No active infections  Patient reported infection: None    Therapy Appropriateness:    Is therapy appropriate and patient progressing towards therapeutic goals? Yes, therapy is appropriate and should be continued    DISEASE/MEDICATION-SPECIFIC INFORMATION      For patients on injectable medications: Patient currently has 0 doses left.  Next injection is scheduled for 10/18 (patient gave on 9/20 when it arrived - has been taking earlier than expected the past few months).Marland Kitchen    PATIENT SPECIFIC NEEDS     - Does the patient have any physical, cognitive, or cultural barriers? No    - Is the patient high risk? No    - Does the patient require a Care Management Plan? No     - Does the patient require physician intervention or other additional services (i.e. nutrition, smoking cessation, social work)? No      SHIPPING     Specialty Medication(s) to be Shipped:   Inflammatory Disorders: Simponi    Other medication(s) to be shipped: No additional medications requested for fill at this time     Changes to insurance: No    Delivery Scheduled: Yes, Expected medication delivery date: Thurs, 10/13.     Medication will be delivered via UPS to the confirmed prescription address in [place].    The patient will receive a drug information handout for each medication shipped and additional FDA Medication Guides as required.  Verified that patient has previously received a Conservation officer, historic buildings and a Surveyor, mining.    The patient or caregiver noted above participated in the development of this care plan and knows that they can request review of or adjustments to the care plan at any time.      All of the patient's questions and concerns have been addressed.    Lanney Gins   [facility] Specialty Pharmacist

## 2021-04-24 NOTE — Unmapped (Unsigned)
Dermatology Note     Assessment and Plan:      Benign Lesions/ Findings:   {derm skin check benign A&P:75424}  - Reassurance provided regarding the benign appearance of lesions noted on exam today; no treatment is indicated in the absence of symptoms/changes.  - Reinforced importance of photoprotective strategies including liberal and frequent sunscreen use of a broad-spectrum SPF 30 or greater, use of protective clothing, and sun avoidance for prevention of cutaneous malignancy and photoaging.  Counseled patient on the importance of regular self-skin monitoring as well as routine clinical skin examinations as scheduled.     Personal history of {derm skin check cancer list:75440} ***  - No evidence of recurrence at this time.  - Discussed maintaining vigilance and counseled on sun protection as above.    There are no diagnoses linked to this encounter.    The patient was advised to call for an appointment should any new, changing, or symptomatic lesions develop.     RTC: No follow-ups on file. or sooner as needed   _________________________________________________________________      Chief Complaint     Chief Complaint   Patient presents with   ??? Hidradenitis Suppurativa     PT here for follow up HS, having flare, using Simponi       HPI     Elizabeth Browning is a 55 y.o. female who presents as a returning patient (last seen 01/10/2021) to [facility] for follow up of HS.     ??  Hidradenitis Suppurativa,??severe Hurley 3--chronic, with exacerbation today:??Failed 80mg  weekly humira, 40mg  weekly humira  Chronic problem with exacerbation, not at goal??  ??- this is a chronic illness that is expected to last greater than 1 year and possibly the life of the patient.   -??We discussed the typical natural history, pathogenesis, treatment options, and expected course as well as the relapsing and sometimes recalcitrant nature of the disease.????  - S/p WLE in B/L axillae and buttocks with graft repair by Plastic surgery, with improvement. Seen by Dr. Lafayette Dragon in Plastics recently with plan to operate on left thigh, but delayed due to patient's smoking status.  - Failed Humira 40 and 80 mg weekly.  - She has completed 2 injections of Simponi and has begun to flare with the transition from Humira.   -??Given??fear around IVs, hold??Remicade for now, but may??need??in the future. We also discussed Cosentyx (Pens) if she fails Simponi.   - Discussed starting spironolactone. No personal history of breast cancer. She does have history of cervical cancer.   Spironolactone: Risks of feminization of female fetus if pregnancy, dizziness, breast tenderness, increased potassium were discussed.  Discussed pregnancy category C and patient should discontinue the medication if trying to conceive or if becomes pregnant. Of note patient s/p hysterectomy, tubal ligation.  - we will stop cefdinir and switch to Augmentin.      Plan:  - Continue??golimumab (SIMPONI) 100 mg/mL PnIj; Inject 200 mg subcutaneously every 4 weeks. ??R/B/A reviewed including but not limited to off label use, risk infection.??  - start amoxicillin-clavulanate (AUGMENTIN) 875-125 mg per tablet; Take 1 tablet by mouth Two (2) times a day.  - start oxyCODONE-acetaminophen (PERCOCET) 10-325 mg per tablet; Take 1 tablet by mouth every four (4) hours as needed for pain.  - start spironolactone (ALDACTONE) 50 MG tablet; Take 50 mg daily for 1 week, and increase to 100 mg daily.  ??  High risk medication use??(Golimumab):  - Check Quant gold today. Will have drawn  at [employer] [employer]  -??She has a history of treated hepatitis C (glecaprevir/pibrentas course early 2018 with undetectable viral load since then). ??    ==================================================================================================================================================  Today patient notes:       Did not get quant gold drawn at [facility]   Discharge summary 04/07/21 reviewed - was admitted 9/23-9/24 for Bilateral - EXCISION OF SKIN & SUBCUTANEOUS TISSUE FOR HIDRADENITIS, PERIANAL, PERINEAL, OR UMBILICAL; COMPLEX REPAIR    04/12/21 Plastic Surgery outpatient follow up note reviewed - wounds healing well       PDMP reviewed       The patient denies any other new or changing lesions or areas of concern.     Pertinent Past Medical History     {derm PMH :75456}    Problem List    None       ***    Family History:   {derm skin check melanoma history:75452}    Past Medical History, Family History, Social History, Medication List, Allergies, and Problem List were reviewed in the rooming section of Epic.     ROS: Other than symptoms mentioned in the HPI, {derm skin check ros:75408::no fevers, chills, or other skin complaints}    Physical Examination     GENERAL: Well-appearing female in no acute distress, resting comfortably.  {PE systems:75418::NEURO: Alert and oriented, answers questions appropriately}  {PE extent:75514}  {PE list:75421}  ***    {PE limitations:75419::All areas not commented on are within normal limits or unremarkable}      (Approved Template 03/27/2020)

## 2021-04-25 ENCOUNTER — Ambulatory Visit: Admit: 2021-04-25 | Discharge: 2021-04-26 | Payer: MEDICAID | Attending: Dermatology | Primary: Dermatology

## 2021-04-25 DIAGNOSIS — L732 Hidradenitis suppurativa: Principal | ICD-10-CM

## 2021-04-25 DIAGNOSIS — Z79899 Other long term (current) drug therapy: Principal | ICD-10-CM

## 2021-04-25 LAB — CBC W/ AUTO DIFF
BASOPHILS ABSOLUTE COUNT: 0 10*9/L (ref 0.0–0.1)
BASOPHILS RELATIVE PERCENT: 0.5 %
EOSINOPHILS ABSOLUTE COUNT: 0.2 10*9/L (ref 0.0–0.5)
EOSINOPHILS RELATIVE PERCENT: 3.1 %
HEMATOCRIT: 40.4 % (ref 34.0–44.0)
HEMOGLOBIN: 13.1 g/dL (ref 11.3–14.9)
LYMPHOCYTES ABSOLUTE COUNT: 4.1 10*9/L — ABNORMAL HIGH (ref 1.1–3.6)
LYMPHOCYTES RELATIVE PERCENT: 50.3 %
MEAN CORPUSCULAR HEMOGLOBIN CONC: 32.5 g/dL (ref 32.0–36.0)
MEAN CORPUSCULAR HEMOGLOBIN: 27.7 pg (ref 25.9–32.4)
MEAN CORPUSCULAR VOLUME: 85.4 fL (ref 77.6–95.7)
MEAN PLATELET VOLUME: 8 fL (ref 6.8–10.7)
MONOCYTES ABSOLUTE COUNT: 0.6 10*9/L (ref 0.3–0.8)
MONOCYTES RELATIVE PERCENT: 7.6 %
NEUTROPHILS ABSOLUTE COUNT: 3.1 10*9/L (ref 1.8–7.8)
NEUTROPHILS RELATIVE PERCENT: 38.5 %
PLATELET COUNT: 553 10*9/L — ABNORMAL HIGH (ref 150–450)
RED BLOOD CELL COUNT: 4.73 10*12/L (ref 3.95–5.13)
RED CELL DISTRIBUTION WIDTH: 13.3 % (ref 12.2–15.2)
WBC ADJUSTED: 8.1 10*9/L (ref 3.6–11.2)

## 2021-04-25 LAB — CREATININE
CREATININE: 0.83 mg/dL — ABNORMAL HIGH
EGFR CKD-EPI (2021) FEMALE: 83 mL/min/{1.73_m2} (ref >=60)

## 2021-04-25 LAB — AST: AST (SGOT): 32 U/L (ref <=34)

## 2021-04-25 LAB — C-REACTIVE PROTEIN: C-REACTIVE PROTEIN: 14 mg/L — ABNORMAL HIGH (ref <=10.0)

## 2021-04-25 LAB — ALT: ALT (SGPT): 19 U/L (ref 10–49)

## 2021-04-25 LAB — BUN: BLOOD UREA NITROGEN: 10 mg/dL (ref 9–23)

## 2021-04-25 LAB — SEDIMENTATION RATE: ERYTHROCYTE SEDIMENTATION RATE: 96 mm/h — ABNORMAL HIGH (ref 0–30)

## 2021-04-25 MED ORDER — CEFDINIR 300 MG CAPSULE
ORAL_CAPSULE | Freq: Two times a day (BID) | ORAL | 0 refills | 30.00000 days | Status: CP
Start: 2021-04-25 — End: ?

## 2021-04-25 MED ORDER — SPIRONOLACTONE 100 MG TABLET
ORAL_TABLET | Freq: Every day | ORAL | 3 refills | 90.00000 days | Status: CP
Start: 2021-04-25 — End: 2021-04-25

## 2021-04-25 MED FILL — SIMPONI 100 MG/ML SUBCUTANEOUS PEN INJECTOR: 28 days supply | Qty: 2 | Fill #6

## 2021-04-25 NOTE — Unmapped (Signed)
Left patient a VM in response to her call to the front desk about WTD dressings and medications. I told her to still do the WTD BID until she is seen by Dr. Lafayette Dragon on 10/20. Couldn't answer the medication question because I didn't know the details.

## 2021-04-25 NOTE — Unmapped (Signed)
Dermatology Note     Assessment and Plan:      Hidradenitis Suppurativa, severe Hurley 3 - chronic, s/p L thigh surgery with plastic surgery, with exacerbation today  Chronic problem with exacerbation, not at goal    - This is a chronic illness that is expected to last greater than 1 year and possibly the life of the patient.   - We discussed the typical natural history, pathogenesis, treatment options, and expected course as well as the relapsing and sometimes recalcitrant nature of the disease. ??  - Failed Humira 40 and 80 mg weekly  - S/p WLE in B/L axillae and buttocks with graft repair by Plastic surgery, with improvement.  - S/p WLE in posterior L thigh and buttock with repair by plastic surgery with current flare.  - She has completed 5 injections of Simponi and has continued to flare  - Patient open to starting infliximab infusions  - Patient is not currently using spironolactone at this time, will hold due to recent lightheadedness and falls     Plan:  - Order placed to start infliximab infusions at 5mg /kg infusions every 8 weeks. R/B/A received including but not limited to risk of infection, infusion site reactions.   - Ordered Quant gold, Hepatitis C RNA PCR (given hx), BUN, creatinine, AST, ALT, C-reactive protein, Sedimentation rate  - Continue golimumab (SIMPONI) 100 mg/mL PnIj; Inject 200 mg subcutaneously every 4 weeks. R/B/A reviewed including but not limited to off label use, risk of infection. Stop once remicade is started.   - Stop amoxicillin-clavulanate (AUGMENTIN) 875-125 mg per tablet  - Start cefdinir (OMNICEF) 300mg  capsule by mouth 2 times a day  - Hold spironolactone due to lightheadedness and falls    High risk medication use (Golimumab):  - Quant gold ordered today  - She has a history of treated hepatitis C (glecaprevir/pibrentas course early 2018 with undetectable viral load since then). ??  - CBC w diff, Cr, BUN, AST, ALT, ESR, CRP with each remicade infusion at week 0,2,6 then every 8 weeks     The patient was advised to call for an appointment should any new, changing, or symptomatic lesions develop.     This visit was billed based on medical decision making as detailed below:   Problem: 1 or more chronic illnesses with exacerbation, progression, or side effects of treatment (moderate)   Data: (Moderate - any combination of 3 from the following) Review of prior external note(s) from each unique source,Review of the result(s) of each unique test, Ordering of each unique test, Assessment requiring an independent historian(s)   Management: Drug therapy requiring intensive monitoring for toxicity (high)       RTC: Return in about 3 months (around 07/26/2021) or sooner as needed   _________________________________________________________________      Chief Complaint     Chief Complaint   Patient presents with   ??? Hidradenitis Suppurativa     PT here for follow up HS, having flare, using Simponi       HPI     Elizabeth Browning is a 55 y.o. female who presents as a returning patient (last seen 01/10/2021) to [facility] for follow up of HS .     At the last visit, the patient had 2 injections of Simponi q4w with flaring after stopping Humira. She has continued to receive injections monthly, which have been well-tolerated with subsequent injections. She also had surgery with Plastic Surgery (Dr. Lafayette Dragon) on 04/06/2021 for excision of buttock/sacral hidradenitis  measuring 15cm x 10cm.  Unfortunately patient is currently flaring in multiple locations including buttocks, left thigh, pannus, groin area. She has significant drainage today and pain.   She has been taking Augmentin BID as recommended but did not realize she had spironolactone refills so has stopped taking that.      She also notes that since her surgery, she has had some lightheadedness and falls. She will follow up with her PCP.    Self-reported severity (0-5): 5  VAS pain today: 10  VAS average pain for the last month: 10  Requiring pain medication? Yes.  If so, what type/frequency? Continously  How often in pain?  Continuously  Level of odor (0-5): 1  Level of itching (0-5): 5  Dressing changes needed for drainage:Twice a day  How much drainage: Some drainage  Flare in the last month (Y/N)? Yes  How long ago was the last flare? In last 6 months  Developing new lesions? Once a week  Number of inflammatory lesions montly: 5-10  DLQI: 30  Current treatment: Golimumab, Augmentin, s/p surgery 9/23 (not currently taking spironolactone)    How helpful is the current treatment in managing the following aspects of your disease?  Not at all helpful Somewhat helpful Very helpful   Pain X     Decreasing length of flares X     Decreasing new lesions X     Drainage X     Decreasing frequency of flares X     Decreasing severity of flares X     Odor          The patient denies any other new or changing lesions or areas of concern.     Pertinent Past Medical History   HS    Family History:   None    Past Medical History, Family History, Social History, Medication List, Allergies, and Problem List were reviewed in the rooming section of Epic.     ROS: Other than symptoms mentioned in the HPI, no fevers, chills, or other skin complaints    Physical Examination     GENERAL: Well-appearing female in no acute distress, resting comfortably.  NEURO: Alert and oriented, answers questions appropriately  PSYCH: Normal mood and affect  EYES: lids clear, conjunctiva pink, no scleral icterus or other color change  SKIN: Examination of the abdomen, back, bilateral upper extremities, buttocks, genitalia and groin was performed  - Large non-draining inflamed nodule at the L anterior thigh  - Midline superior gluteal clef and left posterior thigh and buttocks bandaged from prior surgery, areas with healthy granulation tissue, no odor  - Buttocks with well healed previous surgical sites  - Multiple indurated nodules without drainage at the groin, pannus    All areas not commented on are within normal limits or unremarkable    Elizabeth Browning Flash, MS4    (Approved Template 03/27/2020)

## 2021-04-26 LAB — HEPATITIS C RNA, QUANTITATIVE, PCR: HCV RNA: NOT DETECTED

## 2021-04-27 LAB — QUANTIFERON TB GOLD PLUS
QUANTIFERON ANTIGEN 1 MINUS NIL: 0 [IU]/mL
QUANTIFERON ANTIGEN 2 MINUS NIL: 0 [IU]/mL
QUANTIFERON MITOGEN: 9.97 [IU]/mL
QUANTIFERON TB GOLD PLUS: NEGATIVE
QUANTIFERON TB NIL VALUE: 0.03 [IU]/mL

## 2021-04-27 LAB — TB MITOGEN: TB MITOGEN VALUE: 10

## 2021-04-27 LAB — TB AG2: TB AG2 VALUE: 0.03

## 2021-04-27 LAB — TB AG1: TB AG1 VALUE: 0.03

## 2021-04-27 LAB — TB NIL: TB NIL VALUE: 0.03

## 2021-04-30 NOTE — Unmapped (Signed)
Returned patients call regarding the pain clinic referral. Told her that they have the referral and she should be getting a call soon to schedule. Patient understood.

## 2021-05-01 NOTE — Unmapped (Signed)
CC: Post op    HPI:  Elizabeth Browning is a 55 y.o. female who presents for post op check after post op check after excision of??buttock/sacral hidradenitis measuring 12 cm x 8 cm; left thigh hidradenitis excision measuring 15x 10 cm on 04/06/21.     Since last being seen, she reports that the wounds have closed enough that she was no long able to pack with wet to dry and switched to vaseline. She is hoping to return back to [facility] who manage her depression and that she needs a note for this.     Past Medical History:  Past Medical History:   Diagnosis Date   ??? Anesthesia to pain     difficulty relaxing and letting anesthesia work- anxiety noted   ??? Anxiety    ??? Arthritis    ??? Asthma    ??? Cancer (CMS-HCC)     cervical   ??? Chronic bronchitis (CMS-HCC)    ??? Chronic hepatitis C without hepatic coma (CMS-HCC)     cleared as of 10/25/16 per patient from [facility]   ??? Colon polyp    ??? Difficult intravenous access     requesting site to be numbed   ??? DOE (dyspnea on exertion)    ??? Dysthymia (RAF-HCC)    ??? Fatigue    ??? Foot pain    ??? GERD (gastroesophageal reflux disease) (RAF-HCC)    ??? Hepatitis C    ??? Loosening of knee joint prosthesis (CMS-HCC)     left   ??? Migraine    ??? MRSA (methicillin resistant staph aureus) culture positive     no + culture found- pt denies-current culture negative   ??? Neuropathic pain    ??? URI (upper respiratory infection)    ??? Vitamin D deficiency        Past Surgical History:  Past Surgical History:   Procedure Laterality Date   ??? BUNIONECTOMY Bilateral    ??? CARPOMETACARPEL SUSPENSION PLASTY Bilateral     tendon repair   ??? HAND SURGERY Bilateral     trigger finger   ??? HYSTERECTOMY      partial   ??? JOINT REPLACEMENT Left     knee   ??? KNEE SURGERY Left     revision 8 months later post TKR   ??? MOUTH SURGERY      all teeth removed   ??? PR EXC SWEAT GLAND LESN PERINEAL,COMPLX Bilateral 04/06/2021    Procedure: EXCISION OF SKIN & SUBCUTANEOUS TISSUE FOR HIDRADENITIS, PERIANAL, PERINEAL, OR UMBILICAL; COMPLEX REPAIR;  Surgeon: Arsenio Katz, MD;  Location: MAIN [facility];  Service: Plastics   ??? PR EXC SWEAT GLAND LESN PERINEAL,SIMPL Right 03/09/2019    Procedure: R21 - EXCISION SKIN/SUBCUTANEOUS TISSUE FOR HIDRADENITIS, PERIANAL, PERINEAL, UMBILICAL; SIMPLE/INTERMEDIATE REPR;  Surgeon: Arsenio Katz, MD;  Location: MAIN OR [facility];  Service: Plastics   ??? PR EXC SWEAT GLAND LESN PERINEAL,SIMPL Left 09/14/2019    Procedure: R24, EXCISION SKIN/SUBCUTANEOUS TISSUE FOR HIDRADENITIS, PERIANAL, PERINEAL, UMBILICAL; SIMPLE/INTERMEDIATE REPR;  Surgeon: Arsenio Katz, MD;  Location: MAIN OR [facility];  Service: Plastics   ??? PR INCIS/DRAIN THIGH/KNEE ABSCESS,DEEP Left 11/11/2016    Procedure: I&D DEEP ABSCESS INFEC BURSA/HEMATOMA THIGH/KNEE;  Surgeon: Lowell Bouton, MD;  Location: OR [facility];  Service: Orthopedics   ??? PR REVISE KNEE JOINT REPLACE,ALL PARTS Left 11/04/2016    Procedure: REVISION LEFT TOTAL KNEE REPLACEMENT, FEMORAL AND TIBIAL COMPARTMENT;  Surgeon: Lowell Bouton, MD;  Location: OR [facility];  Service: Orthopedics   ??? TOE AMPUTATION Right     pinky    ??? TONSSILLECTOMY     ??? TUBAL LIGATION     ??? UMBILICAL HERNIA REPAIR      age 10       Medications:  Current Outpatient Medications   Medication Sig Dispense Refill   ??? ADVAIR DISKUS 250-50 mcg/dose diskus INHALE 1 PUFF BY MOUTH TWICE DAILY 12 HOURS APART     ??? albuterol HFA 90 mcg/actuation inhaler Inhale 2 puffs every four (4) hours as needed.     ??? atorvastatin (LIPITOR) 40 MG tablet Take 1 tablet by mouth in the morning.     ??? cefdinir (OMNICEF) 300 MG capsule Take 1 capsule (300 mg total) by mouth Two (2) times a day. 60 capsule 0   ??? DEXILANT 60 mg capsule TK 1 C PO QAM FOR HEARTBURN AND ABDOMINAL PAIN (Patient not taking: Reported on 04/24/2021)     ??? empty container Misc use as directed 1 each 2   ??? empty container Misc Use as directed 1 each 2   ??? golimumab (SIMPONI) 100 mg/mL PnIj Inject the contents of 2 pens (200mg ) under the skin on week 0 and week 2 as loading doses. 4 mL 0   ??? golimumab 100 mg/mL PnIj Inject the contents of 2 pens (200mg ) under the skin every 4 weeks as maintenance. 2 mL 11   ??? HUMIRA PEN CITRATE FREE 40 MG/0.4 ML Inject the contents of 2 pens (80mg ) under the skin every 7 days as directed by prescriber 8 each 11   ??? hydroquinone 4 % cream Apply topically Two (2) times a day. Use for up to 12 weeks (Patient not taking: Reported on 04/24/2021) 30 g 3   ??? ibuprofen (MOTRIN) 800 MG tablet Take 800 mg by mouth every six (6) hours as needed.      ??? ipratropium-albuteroL (DUO-NEB) 0.5-2.5 mg/3 mL nebulizer USE 1 VIAL VIA NEBULIZER EVERY 4 TO 6 HOURS AS NEEDED FOR COUGH OR WHEEZING     ??? oxyCODONE (ROXICODONE) 5 MG immediate release tablet Take 1 tablet (5 mg total) by mouth every eight (8) hours as needed for pain for up to 10 doses. 10 tablet 0   ??? pantoprazole (PROTONIX) 20 MG tablet Take 40 mg by mouth in the morning.     ??? pregabalin (LYRICA) 50 MG capsule Take 1 capsule (50 mg total) by mouth Three (3) times a day for 10 days. 30 capsule 0   ??? PREMARIN 0.625 mg/gram vaginal cream  (Patient not taking: Reported on 04/24/2021)     ??? traZODone (DESYREL) 50 MG tablet Take 50-100 mg by mouth nightly.  (Patient not taking: Reported on 04/24/2021)     ??? URO-MP 118-10-40.8-36 mg cap TAKE 1 CAPSULE BY MOUTH FOUR TIMES DAILY AS NEEDED (Patient not taking: No sig reported)       No current facility-administered medications for this visit.       Allergies:  Allergies   Allergen Reactions   ??? Buprenorphine Hcl Hives     Unknown-pt denies   ??? Codeine Rash, Hives and Itching   ??? Adhesive Rash   ??? Iodinated Contrast Media Rash   ??? Latex Rash   ??? Morphine Itching   ??? Opioids - Morphine Analogues Itching     Unknown-pt denies         Family History:   Family History   Problem Relation Age of Onset   ??? Hypertension Mother    ???  Diabetes Mother    ??? Hypertension Father    ??? Cancer Father         Leukemia   ??? Cancer Sister breast    The patient reports no family history for bleeding disorders or anesthetic problems.    Social History:  Social History     Socioeconomic History   ??? Marital status: Single   Tobacco Use   ??? Smoking status: Former Smoker     Packs/day: 0.50     Years: 30.00     Pack years: 15.00     Types: Cigarettes   ??? Smokeless tobacco: Never Used   ??? Tobacco comment: started smoking age as a teen   Substance and Sexual Activity   ??? Alcohol use: No   ??? Drug use: No   Other Topics Concern   ??? Do you use sunscreen? No   ??? Tanning bed use? No   ??? Are you easily burned? No   ??? Excessive sun exposure? No   ??? Blistering sunburns? No       ROS:   Otherwise, 12 point review of systems was completed and is negative except as per HPI.      Vitals:   Vitals:    05/03/21 1317   BP: 121/73   Pulse: 94   Resp: 16   Temp: 36.2 ??C (97.2 ??F)   SpO2: 100%     ?  Physical Exam:   Gen: AA in NAD  HEENT: NCAT, EOMI, PERRL, Non icteric sclerae, MMM  CV: RRR  RESP: quiet respirations on room air  EXT: warm and well perfused  BUTTOCKS: Midline wound just superior to the gluteal cleft and left thigh wound now almost flush with the surrounding skin with healthy granulation tissue that is reepithelializing from the periphery. No drainage or malodor     Impression and Plan:  Elizabeth Browning is here today for post op check after excision of??buttock/sacral hidradenitis measuring 12 cm x 8 cm; left thigh hidradenitis excision measuring 15x 10 cm on 04/06/21. Doing well.     - Daily xeroform dressing changes   - Follow up in 4 weeks  - [facility]      Return in about 4 weeks (around 05/31/2021).    ______________________________________________________________________    Documentation assistance was provided by Marga Melnick, Scribe, on May 03, 2021 at 1:50 PM for Loop C. Lafayette Dragon, MD    The documentation recorded by the scribe accurately reflects the service I personally performed and the decisions made by me Janet Berlin.

## 2021-05-03 ENCOUNTER — Ambulatory Visit: Admit: 2021-05-03 | Discharge: 2021-05-04 | Payer: MEDICAID

## 2021-05-03 DIAGNOSIS — L732 Hidradenitis suppurativa: Principal | ICD-10-CM

## 2021-05-03 NOTE — Unmapped (Signed)
APPOINTMENTS / QUESTIONS  For questions regarding appointments or your surgical care, Monday through Friday, 8 am to 4:30 pm    [facility]  [address],   [place] [address]  T: [phone]  F: [phone]    Ambulatory Surgery Center  1st Floor, Ambulatory Care Center  [address]  [place], [place] [address]  T: [phone]  Website:  [facility]    [facility]  [address] [place], [place] [address]  Ground Floor [facility]  T: [phone]  F: [phone]    [facility]  [address], [place], [place] [address]  T: [phone]  F: [phone]    [facility]  [address], [place], [place] [address]   1st Floor [facility]  T: [phone]  F: [phone]    [facility]  [address] [place], [place] [address]  Third Floor, [address]  T: [phone]   F: [phone]    Administrative Office  [address]  [place], [place] [address]  T:  [phone]  F:  [phone]      Physicians:  [name] MD:   Nurse:  Vista Mink BSN, RN [phone]  [email or web address]   Surgery Scheduler: Lysle Morales  [phone] [email or web address]    Tarry Kos MD:   Nurse:  Elisabeth Pigeon BSN, RN, CPSN [phone] [email or web address]  Surgery Scheduler:   Lysle Morales  [phone] [email or web address]    Adeyemi Clydie Braun MD:  Nurse: Mila Palmer BSN, RN, [facility]  [phone] [email or web address]  Surgery Scheduler: Kristine Royal [phone] [email or web address]    Darleene Cleaver MD:  Nurse: Venetia Maxon BSN, RN, CPSN  [phone]  [email or web address]  Surgery Scheduler: Kristine Royal [phone] [email or web address]    Advance Practice Providers:  Ezekiel Slocumb, DNP, NP-C:   Nurse: Britt Boozer, MSN. RN [phone]  [email or web address]  Surgery Scheduler: Kristine Royal [phone] [email or web address]    Rush Landmark, PA-C:   Nurse: Venetia Maxon BSN, RN, CPSN  [phone] [email or web address]  Surgery Scheduler: Kristine Royal [phone] [email or web address]    Financial Counselor:   Deloria Lair  T: [phone]  F: [phone]  E-mail:  [email or web address]     Surgery Scheduling:  You will receive a phone call from the surgery schedulers regarding date for surgery in 1-2 weeks from  your appointment.    Please call one of the above numbers if you have not received a phone call 2 weeks after your appointment with the provider.      [facility] Contact Information: Please call to schedule  *Radiology (CT, X-ray, Ultrasound) [phone]  *Mammography & Breast Ultrasound [phone]  *MRI [phone]  *Interventional Radiology [phone]    AFTER HOURS/HOLIDAYS:  For emergencies after-hours (after 5pm, or weekends): call the [facility] operator [phone]) and ask to page the Plastic Surgery resident on call. You will be directed to a surgery resident who likely is not immediately aware of the details of your case, but can help you deal with any emergencies that cannot wait until regular business hours.  Please be aware that this person is responding to many in-hospital emergencies and patient issues and may not answer your phone call immediately, but will return your call as soon as possible.    Billing Questions/Financial Navigation:  [phone]    Precare Location:  [facility] Pre-Procedure Services at [facility])   [address]  [place], [place] [address] [facility] -  old [address]ide [address] near [employer])    Blood Work Location:  [address], [place] [address]. There are no suite numbers, but it is located on the first floor there. Go to the main desk and tell them you are there for walk-in labs.  Precare:   The day prior to your scheduled surgery, Pre-Care will call you with instructions.  If you have not heard from them by 4PM, and would like to check on the status of your surgery, please call:  [facility]: [phone]  [facility]: [phone]  [facility]: [phone]  [place]:  [phone]    Minor Procedure Room:  Please do not wear any jewelry to your procedure.    Weather Hotline:  [phone]    FLMA forms and paperwork:  Please allow  a 2 week turn around for forms to be completed and faxed.

## 2021-05-10 NOTE — Unmapped (Signed)
Called [facility] to find out where the Xeroform is and [place] medicaid does not cover it but [facility] says it's only $1.00. I let patient know she can call them and order it from them. Patient agreed. I provided [facility] phone #.

## 2021-05-10 NOTE — Unmapped (Signed)
Patient left a voicemail stating she did not get the vaseline strips from [facility]. I rechecked the order placed and left voicemail with patient letting them know I ordered Xeroform but will follow up to make sure. Also let patient know to reach out if she still doesn't receive the product by tomorrow 10/28.

## 2021-05-16 NOTE — Unmapped (Signed)
[facility] Refill Coordination Note    Specialty Medication(s) to be Shipped:   Inflammatory Disorders: Simponi    Other medication(s) to be shipped: No additional medications requested for fill at this time     Elizabeth Browning, DOB: 13-Mar-1966  Phone: [phone] (home)       All above HIPAA information was verified with patient.     Was a Nurse, learning disability used for this call? No    Completed refill call assessment today to schedule patient's medication shipment from the [facility] [phone]).  All relevant notes have been reviewed.     Specialty medication(s) and dose(s) confirmed: Regimen is correct and unchanged.   Changes to medications: Elizabeth Browning reports no changes at this time.  Changes to insurance: No  New side effects reported not previously addressed with a pharmacist or physician: None reported  Questions for the pharmacist: No    Confirmed patient received a Conservation officer, historic buildings and a Surveyor, mining with first shipment. The patient will receive a drug information handout for each medication shipped and additional FDA Medication Guides as required.       DISEASE/MEDICATION-SPECIFIC INFORMATION        For patients on injectable medications: Patient currently has 0 doses left.  Next injection is scheduled for 05/22/21.    SPECIALTY MEDICATION ADHERENCE     Medication Adherence    Patient reported X missed doses in the last month: 0  Specialty Medication: SIMPONI  Patient is on additional specialty medications: No              Were doses missed due to medication being on hold? No    Simponi 100 mg/ml: 0 days of medicine on hand        REFERRAL TO PHARMACIST     Referral to the pharmacist: Not needed      [facility]     Shipping address confirmed in Epic.     Delivery Scheduled: Yes, Expected medication delivery date: 05/22/21.     Medication will be delivered via UPS to the prescription address in Epic WAM.    Unk Lightning   [facility] Specialty Technician

## 2021-05-21 MED FILL — SIMPONI 100 MG/ML SUBCUTANEOUS PEN INJECTOR: 28 days supply | Qty: 2 | Fill #7

## 2021-05-27 DIAGNOSIS — L732 Hidradenitis suppurativa: Principal | ICD-10-CM

## 2021-06-01 NOTE — Unmapped (Signed)
CC: Post op    HPI:  Elizabeth Browning is a 55 y.o. female who presents for post op check after post op check after excision of??buttock/sacral hidradenitis measuring 12 cm x 8 cm; left thigh hidradenitis excision measuring 15x 10 cm on 04/06/21.     Since last being seen, she has healed in most of the wounds.  She feels like the gluteal crease incision is itchy sometimes.  No drainage, no fevers or purulence.  She would like a referral to orthopedic surgery as she is concerned that some of the pain she is having is specific to her joint (as she has already had a L TKA).      Past Medical History:  Past Medical History:   Diagnosis Date   ??? Anesthesia to pain     difficulty relaxing and letting anesthesia work- anxiety noted   ??? Anxiety    ??? Arthritis    ??? Asthma    ??? Cancer (CMS-HCC)     cervical   ??? Chronic bronchitis (CMS-HCC)    ??? Chronic hepatitis C without hepatic coma (CMS-HCC)     cleared as of 10/25/16 per patient from [facility]   ??? Colon polyp    ??? Difficult intravenous access     requesting site to be numbed   ??? DOE (dyspnea on exertion)    ??? Dysthymia (RAF-HCC)    ??? Fatigue    ??? Foot pain    ??? GERD (gastroesophageal reflux disease) (RAF-HCC)    ??? Hepatitis C    ??? Loosening of knee joint prosthesis (CMS-HCC)     left   ??? Migraine    ??? MRSA (methicillin resistant staph aureus) culture positive     no + culture found- pt denies-current culture negative   ??? Neuropathic pain    ??? URI (upper respiratory infection)    ??? Vitamin D deficiency        Past Surgical History:  Past Surgical History:   Procedure Laterality Date   ??? BUNIONECTOMY Bilateral    ??? CARPOMETACARPEL SUSPENSION PLASTY Bilateral     tendon repair   ??? HAND SURGERY Bilateral     trigger finger   ??? HYSTERECTOMY      partial   ??? JOINT REPLACEMENT Left     knee   ??? KNEE SURGERY Left     revision 8 months later post TKR   ??? MOUTH SURGERY      all teeth removed   ??? PR EXC SWEAT GLAND LESN PERINEAL,COMPLX Bilateral 04/06/2021    Procedure: EXCISION OF SKIN & SUBCUTANEOUS TISSUE FOR HIDRADENITIS, PERIANAL, PERINEAL, OR UMBILICAL; COMPLEX REPAIR;  Surgeon: Arsenio Katz, MD;  Location: MAIN OR [facility];  Service: Plastics   ??? PR EXC SWEAT GLAND LESN PERINEAL,SIMPL Right 03/09/2019    Procedure: R21 - EXCISION SKIN/SUBCUTANEOUS TISSUE FOR HIDRADENITIS, PERIANAL, PERINEAL, UMBILICAL; SIMPLE/INTERMEDIATE REPR;  Surgeon: Arsenio Katz, MD;  Location: MAIN OR [facility];  Service: Plastics   ??? PR EXC SWEAT GLAND LESN PERINEAL,SIMPL Left 09/14/2019    Procedure: R24, EXCISION SKIN/SUBCUTANEOUS TISSUE FOR HIDRADENITIS, PERIANAL, PERINEAL, UMBILICAL; SIMPLE/INTERMEDIATE REPR;  Surgeon: Arsenio Katz, MD;  Location: MAIN OR [facility];  Service: Plastics   ??? PR INCIS/DRAIN THIGH/KNEE ABSCESS,DEEP Left 11/11/2016    Procedure: I&D DEEP ABSCESS INFEC BURSA/HEMATOMA THIGH/KNEE;  Surgeon: Lowell Bouton, MD;  Location: OR [facility];  Service: Orthopedics   ??? PR REVISE KNEE JOINT REPLACE,ALL PARTS Left 11/04/2016    Procedure: REVISION LEFT TOTAL KNEE  REPLACEMENT, FEMORAL AND TIBIAL COMPARTMENT;  Surgeon: Lowell Bouton, MD;  Location: OR [facility];  Service: Orthopedics   ??? TOE AMPUTATION Right     pinky    ??? TONSSILLECTOMY     ??? TUBAL LIGATION     ??? UMBILICAL HERNIA REPAIR      age 58       Medications:  Current Outpatient Medications   Medication Sig Dispense Refill   ??? ADVAIR DISKUS 250-50 mcg/dose diskus INHALE 1 PUFF BY MOUTH TWICE DAILY 12 HOURS APART     ??? albuterol HFA 90 mcg/actuation inhaler Inhale 2 puffs every four (4) hours as needed.     ??? atorvastatin (LIPITOR) 40 MG tablet Take 1 tablet by mouth in the morning.     ??? cefdinir (OMNICEF) 300 MG capsule Take 1 capsule (300 mg total) by mouth Two (2) times a day. 60 capsule 0   ??? DEXILANT 60 mg capsule TK 1 C PO QAM FOR HEARTBURN AND ABDOMINAL PAIN (Patient not taking: Reported on 04/24/2021)     ??? empty container Misc use as directed 1 each 2   ??? empty container Misc Use as directed 1 each 2   ??? golimumab (SIMPONI) 100 mg/mL PnIj Inject the contents of 2 pens (200mg ) under the skin on week 0 and week 2 as loading doses. 4 mL 0   ??? golimumab 100 mg/mL PnIj Inject the contents of 2 pens (200mg ) under the skin every 4 weeks as maintenance. 2 mL 11   ??? HUMIRA PEN CITRATE FREE 40 MG/0.4 ML Inject the contents of 2 pens (80mg ) under the skin every 7 days as directed by prescriber 8 each 11   ??? hydroquinone 4 % cream Apply topically Two (2) times a day. Use for up to 12 weeks (Patient not taking: Reported on 04/24/2021) 30 g 3   ??? ibuprofen (MOTRIN) 800 MG tablet Take 800 mg by mouth every six (6) hours as needed.      ??? ipratropium-albuteroL (DUO-NEB) 0.5-2.5 mg/3 mL nebulizer USE 1 VIAL VIA NEBULIZER EVERY 4 TO 6 HOURS AS NEEDED FOR COUGH OR WHEEZING     ??? oxyCODONE (ROXICODONE) 5 MG immediate release tablet Take 1 tablet (5 mg total) by mouth every eight (8) hours as needed for pain for up to 10 doses. 10 tablet 0   ??? pantoprazole (PROTONIX) 20 MG tablet Take 40 mg by mouth in the morning.     ??? pregabalin (LYRICA) 50 MG capsule Take 1 capsule (50 mg total) by mouth Three (3) times a day for 10 days. 30 capsule 0   ??? PREMARIN 0.625 mg/gram vaginal cream  (Patient not taking: Reported on 04/24/2021)     ??? traZODone (DESYREL) 50 MG tablet Take 50-100 mg by mouth nightly.  (Patient not taking: Reported on 04/24/2021)     ??? URO-MP 118-10-40.8-36 mg cap TAKE 1 CAPSULE BY MOUTH FOUR TIMES DAILY AS NEEDED (Patient not taking: No sig reported)       No current facility-administered medications for this visit.       Allergies:  Allergies   Allergen Reactions   ??? Buprenorphine Hcl Hives     Unknown-pt denies   ??? Codeine Rash, Hives and Itching   ??? Adhesive Rash   ??? Iodinated Contrast Media Rash   ??? Latex Rash   ??? Morphine Itching   ??? Opioids - Morphine Analogues Itching     Unknown-pt denies         Family History:  Family History   Problem Relation Age of Onset   ??? Hypertension Mother    ??? Diabetes Mother    ??? Hypertension Father    ??? Cancer Father         Leukemia   ??? Cancer Sister         breast    The patient reports no family history for bleeding disorders or anesthetic problems.    Social History:  Social History     Socioeconomic History   ??? Marital status: Single   Tobacco Use   ??? Smoking status: Former     Packs/day: 0.50     Years: 30.00     Pack years: 15.00     Types: Cigarettes   ??? Smokeless tobacco: Never   ??? Tobacco comments:     started smoking age as a teen   Substance and Sexual Activity   ??? Alcohol use: No   ??? Drug use: No   Other Topics Concern   ??? Do you use sunscreen? No   ??? Tanning bed use? No   ??? Are you easily burned? No   ??? Excessive sun exposure? No   ??? Blistering sunburns? No       ROS:   Otherwise, 12 point review of systems was completed and is negative except as per HPI.      Vitals:   There were no vitals filed for this visit.  ?  Physical Exam:   Gen: AA in NAD  HEENT: NCAT, EOMI, PERRL, Non icteric sclerae, MMM  CV: RRR  RESP: quiet respirations on room air  EXT: warm and well perfused  BUTTOCKS: Midline wound just superior to the gluteal cleft and left thigh wound now almost flush with the surrounding skin with healthy granulation tissue that is reepithelializing from the periphery. No drainage or malodor     Impression and Plan:  Elizabeth Browning is here today for post op check after excision of??buttock/sacral hidradenitis measuring 12 cm x 8 cm; left thigh hidradenitis excision measuring 15x 10 cm on 04/06/21. Doing well.     - Daily xeroform dressing changes   - Follow up in 4 weeks  - Referral to ortho      No follow-ups on file.

## 2021-06-01 NOTE — Unmapped (Signed)
APPOINTMENTS / QUESTIONS  For questions regarding appointments or your surgical care, Monday through Friday, 8 am to 4:30 pm    [facility]  [address],   [place] [address]  T: [phone]  F: [phone]    Ambulatory Surgery Center  1st Floor, Ambulatory Care Center  [address]  [place], [place] [address]  T: [phone]  Website:  [facility]    [facility]  [address] [place], [place] [address]  Ground Floor [facility]  T: [phone]  F: [phone]    [facility]  [address], [place], [place] [address]  T: [phone]  F: [phone]    [facility]  [address], [place], [place] [address]   1st Floor [facility]  T: [phone]  F: [phone]    [facility]  [address] [place], [place] [address]  Third Floor, [address]  T: [phone]   F: [phone]    Administrative Office  [address]  [place], [place] [address]  T:  [phone]  F:  [phone]      Physicians:  [name] MD:   Nurse:  Vista Mink BSN, RN [phone]  [email or web address]   Surgery Scheduler: Lysle Morales  [phone] [email or web address]    Tarry Kos MD:   Nurse:  Elisabeth Pigeon BSN, RN, CPSN [phone] [email or web address]  Surgery Scheduler:   Lysle Morales  [phone] [email or web address]    Adeyemi Clydie Braun MD:  Nurse: Mila Palmer BSN, RN, [facility]  [phone] [email or web address]  Surgery Scheduler: Kristine Royal [phone] [email or web address]    Darleene Cleaver MD:  Nurse: Venetia Maxon BSN, RN, CPSN  [phone]  [email or web address]  Surgery Scheduler: Kristine Royal [phone] [email or web address]    Advance Practice Providers:  Ezekiel Slocumb, DNP, NP-C:   Nurse: Britt Boozer, MSN. RN [phone]  [email or web address]  Surgery Scheduler: Kristine Royal [phone] [email or web address]    Rush Landmark, PA-C:   Nurse: Venetia Maxon BSN, RN, CPSN  [phone] [email or web address]  Surgery Scheduler: Kristine Royal [phone] [email or web address]    Financial Counselor:   Deloria Lair  T: [phone]  F: [phone]  E-mail:  [email or web address]     Surgery Scheduling:  You will receive a phone call from the surgery schedulers regarding date for surgery in 1-2 weeks from  your appointment.    Please call one of the above numbers if you have not received a phone call 2 weeks after your appointment with the provider.      [facility] Contact Information: Please call to schedule  *Radiology (CT, X-ray, Ultrasound) [phone]  *Mammography & Breast Ultrasound [phone]  *MRI [phone]  *Interventional Radiology [phone]    AFTER HOURS/HOLIDAYS:  For emergencies after-hours (after 5pm, or weekends): call the [facility] operator [phone]) and ask to page the Plastic Surgery resident on call. You will be directed to a surgery resident who likely is not immediately aware of the details of your case, but can help you deal with any emergencies that cannot wait until regular business hours.  Please be aware that this person is responding to many in-hospital emergencies and patient issues and may not answer your phone call immediately, but will return your call as soon as possible.    Billing Questions/Financial Navigation:  [phone]    Precare Location:  [facility] Pre-Procedure Services at [facility])   [address]  [place], [place] [address] [facility] -  old [address]ide [address] near [employer])    Blood Work Location:  [address], [place] [address]. There are no suite numbers, but it is located on the first floor there. Go to the main desk and tell them you are there for walk-in labs.  Precare:   The day prior to your scheduled surgery, Pre-Care will call you with instructions.  If you have not heard from them by 4PM, and would like to check on the status of your surgery, please call:  [facility]: [phone]  [facility]: [phone]  [facility]: [phone]  [place]:  [phone]    Minor Procedure Room:  Please do not wear any jewelry to your procedure.    Weather Hotline:  [phone]    FLMA forms and paperwork:  Please allow  a 2 week turn around for forms to be completed and faxed.

## 2021-06-04 ENCOUNTER — Ambulatory Visit: Admit: 2021-06-04 | Discharge: 2021-06-05 | Payer: MEDICAID

## 2021-06-04 DIAGNOSIS — M25559 Pain in unspecified hip: Principal | ICD-10-CM

## 2021-06-04 DIAGNOSIS — L732 Hidradenitis suppurativa: Principal | ICD-10-CM

## 2021-06-14 NOTE — Unmapped (Signed)
[facility] Refill Coordination Note    Specialty Medication(s) to be Shipped:   Inflammatory Disorders: Simponi    Other medication(s) to be shipped: No additional medications requested for fill at this time     Elizabeth Browning, DOB: [birth year]  Phone: [phone] (home)       All above HIPAA information was verified with patient.     Was a Nurse, learning disability used for this call? No    Completed refill call assessment today to schedule patient's medication shipment from the [facility] [phone]).  All relevant notes have been reviewed.     Specialty medication(s) and dose(s) confirmed: Regimen is correct and unchanged.   Changes to medications: Joclynn reports no changes at this time.  Changes to insurance: No  New side effects reported not previously addressed with a pharmacist or physician: None reported  Questions for the pharmacist: No    Confirmed patient received a Conservation officer, historic buildings and a Surveyor, mining with first shipment. The patient will receive a drug information handout for each medication shipped and additional FDA Medication Guides as required.       DISEASE/MEDICATION-SPECIFIC INFORMATION        For patients on injectable medications: Patient currently has 0 doses left.  Next injection is scheduled for 06/19/2021.    SPECIALTY MEDICATION ADHERENCE     Medication Adherence    Patient reported X missed doses in the last month: 0  Specialty Medication: SIMPONI 100 mg/mL  Patient is on additional specialty medications: No  Any gaps in refill history greater than 2 weeks in the last 3 months: no  Demonstrates understanding of importance of adherence: yes  Informant: patient  Reliability of informant: reliable  Confirmed plan for next specialty medication refill: delivery by pharmacy  Refills needed for supportive medications: not needed              Were doses missed due to medication being on hold? No    Simponi 100 mg/ml: 0 days of medicine on hand REFERRAL TO PHARMACIST     Referral to the pharmacist: Not needed      [facility]     Shipping address confirmed in Epic.     Delivery Scheduled: Yes, Expected medication delivery date: 06/19/2021.     Medication will be delivered via UPS to the prescription address in Epic WAM.    Kylar Speelman D Emalie Mcwethy   [facility] Specialty Technician

## 2021-06-15 ENCOUNTER — Ambulatory Visit: Admit: 2021-06-15 | Discharge: 2021-06-16 | Payer: MEDICAID

## 2021-06-15 DIAGNOSIS — L732 Hidradenitis suppurativa: Principal | ICD-10-CM

## 2021-06-15 LAB — CBC W/ AUTO DIFF
BASOPHILS ABSOLUTE COUNT: 0.1 10*9/L (ref 0.0–0.1)
BASOPHILS RELATIVE PERCENT: 1 %
EOSINOPHILS ABSOLUTE COUNT: 0.2 10*9/L (ref 0.0–0.5)
EOSINOPHILS RELATIVE PERCENT: 1.5 %
HEMATOCRIT: 42.4 % (ref 34.0–44.0)
HEMOGLOBIN: 13.7 g/dL (ref 11.3–14.9)
LYMPHOCYTES ABSOLUTE COUNT: 4.4 10*9/L — ABNORMAL HIGH (ref 1.1–3.6)
LYMPHOCYTES RELATIVE PERCENT: 41 %
MEAN CORPUSCULAR HEMOGLOBIN CONC: 32.4 g/dL (ref 32.0–36.0)
MEAN CORPUSCULAR HEMOGLOBIN: 27.7 pg (ref 25.9–32.4)
MEAN CORPUSCULAR VOLUME: 85.3 fL (ref 77.6–95.7)
MEAN PLATELET VOLUME: 7.9 fL (ref 6.8–10.7)
MONOCYTES ABSOLUTE COUNT: 0.7 10*9/L (ref 0.3–0.8)
MONOCYTES RELATIVE PERCENT: 6.5 %
NEUTROPHILS ABSOLUTE COUNT: 5.4 10*9/L (ref 1.8–7.8)
NEUTROPHILS RELATIVE PERCENT: 50 %
NUCLEATED RED BLOOD CELLS: 0 /100{WBCs} (ref <=4)
PLATELET COUNT: 505 10*9/L — ABNORMAL HIGH (ref 150–450)
RED BLOOD CELL COUNT: 4.96 10*12/L (ref 3.95–5.13)
RED CELL DISTRIBUTION WIDTH: 12.7 % (ref 12.2–15.2)
WBC ADJUSTED: 10.8 10*9/L (ref 3.6–11.2)

## 2021-06-15 LAB — ALT: ALT (SGPT): 8 U/L — ABNORMAL LOW (ref 10–49)

## 2021-06-15 LAB — SEDIMENTATION RATE: ERYTHROCYTE SEDIMENTATION RATE: 72 mm/h — ABNORMAL HIGH (ref 0–30)

## 2021-06-15 LAB — AST: AST (SGOT): 17 U/L (ref <=34)

## 2021-06-15 LAB — C-REACTIVE PROTEIN: C-REACTIVE PROTEIN: 8 mg/L (ref <=10.0)

## 2021-06-15 LAB — CREATININE
CREATININE: 0.89 mg/dL — ABNORMAL HIGH
EGFR CKD-EPI (2021) FEMALE: 76 mL/min/{1.73_m2} (ref >=60)

## 2021-06-15 LAB — BUN: BLOOD UREA NITROGEN: 7 mg/dL — ABNORMAL LOW (ref 9–23)

## 2021-06-15 MED ADMIN — inFLIXimab-axxq (AVSOLA) 5 mg/kg = 500 mg in sodium chloride (NS) 250 mL IVPB: 5 mg/kg | INTRAVENOUS | @ 16:00:00 | Stop: 2021-06-15

## 2021-06-15 MED ADMIN — cetirizine (ZyrTEC) tablet 10 mg: 10 mg | ORAL | @ 15:00:00 | Stop: 2021-06-15

## 2021-06-15 MED ADMIN — methylPREDNISolone sodium succinate (PF) (Solu-MEDROL) injection 20 mg: 20 mg | INTRAVENOUS | @ 15:00:00 | Stop: 2021-06-15

## 2021-06-15 MED ADMIN — acetaminophen (TYLENOL) tablet 650 mg: 650 mg | ORAL | @ 15:00:00 | Stop: 2021-06-15

## 2021-06-15 NOTE — Unmapped (Signed)
Patient presents for first loading dose Avsola (infliximab-axxq) infusion.  In no acute distress. Vitals stable.  Reports no new medical issues or S/S of infection. Pt extremely nervous about IV stick.  IV started easily in LAC. See MAR for premeds.    1033 Avsola (infliximab-axxq) 500 mg started to infuse as follows:     28ml/hr x 15 min  38ml/hr x 15 min  65ml/hr x 15 min  80ml/hr x 15 min  114ml/hr x 30 min  244ml/hr for the remainder of the infusion.    1241 Avsola (infliximab-axxq) infusion complete.  PIV flushed with NS.  Vitals stable.  Patient without any s/s of adverse reaction.    1300 IV d/c'd.  Patient discharged from Infusion Center.

## 2021-06-18 MED FILL — SIMPONI 100 MG/ML SUBCUTANEOUS PEN INJECTOR: 28 days supply | Qty: 2 | Fill #8

## 2021-06-22 NOTE — Unmapped (Addendum)
Department of Anesthesiology  [facility]  [address], [address]  [place], [place] [address]  [phone]    Date: June 25, 2021  Patient Name: Elizabeth Browning  MRN: [id number]  PCP: Lilian Kapur  Referring Provider: Arsenio Katz*     Assessment:   Attending: Hedwig Morton Lilymae Swiech is a 55 y.o. female with a PMHx of asthma, GERD, migraine, obesity, hep C, hx of cervical ca. She is being seen at the [facility] Management Center as referral for chronic buttock and sacral pain secondary to hidradenitis suppurativa s/p multiple surgeries last on 04/06/21 . She also expresses concerns regarding left hip pain.    1. Hidradenitis suppurativa    2. Left hip pain      Hidradenitis suppurativa  Chronic, not at goal. Patient has a history of hidradenitis suppurativa s/p multiple excisions which she associates with her sacral pain. She describes the pain as tight and sore around her excision site. Regarding medication management, patient notes percocet and morphine have worked well in the past. After further probing of all multimodal trials, she reports she has been on Lyrica in the past with benefit and no adverse side effects and is interested in restarting this medication. She has not tried topicals in the past which can be considered in the future once skin is healed.   - Start pregabalin (LYRICA) 50 MG capsule; Take 1 capsule (50 mg total) by mouth Three (3) times a day.  Dispense: 90 capsule; Refill: 2    Left hip pain  Patient presents with pain localized to her left hip without radiation into the groin. Her pain began 6 months ago without any inciting events and injuries and has progressively worsened. Her pain is aching and dull, which worsens with ambulation and certain movements. She has relied on OTC NSAIDs for pain relief. She is following with Orthopedics on 07/18/21 for her left hip pain and does not have any prior imaging. We will order a left hip XR.   - Counseled on safe NSAID and tylenol usage for which the patient expressed understanding  - Lyrica as above  - Order left hip XR  - Follow with Orthopedics on 07/18/21  - Follow in 6-8 weeks  - XR Hip 2 Views Left; Future    General Recommendations: The pain condition that the patient suffers from is best treated with a multidisciplinary approach that involves an increase in physical activity to prevent de-conditioning and worsening of the pain cycle, as well as psychological counseling (formal and/or informal) to address the co-morbid psychological affects of pain.  Treatment will often involve judicious use of pain medications and interventional procedures to decrease the pain, allowing the patient to participate in the physical activity that will ultimately produce long-lasting pain reductions.  The goal of the multidisciplinary approach is to return the patient to a higher level of overall function and to restore their ability to perform activities of daily living.    Future Considerations:  - lidocaine gel over healed excision   - Titration of Lyrica  - Topamax    -Return in about 8 weeks (around 08/20/2021).    Orders Placed This Encounter   Procedures   ??? XR Hip 2 Views Left     Standing Status:   Future     Standing Expiration Date:   06/25/2022     Order Specific Question:   Is the patient pregnant?     Answer:   Unknown  Order Specific Question:   Performed at     Answer:   [facility]     Order Specific Question:   Reason for Exam:     Answer:   left hip pain     Requested Prescriptions     Signed Prescriptions Disp Refills   ??? pregabalin (LYRICA) 50 MG capsule 90 capsule 2     Sig: Take 1 capsule (50 mg total) by mouth Three (3) times a day.     Patient does  appear to be utilizing pain medications appropriately and does  report that the medications do improve patient's quality of life and functionality level.  At today's visit, the patient reports fair analgesia from their current medication regimen without significant adverse effects.    Current Pain Provider: no  Urine toxicology: None  Urine toxicology screen N/A appropriate.   Last Opioid Change: N/A  Last EKG: N/A  Previous Compliance Issues: None  Naloxone ordered: N/A  Total morphine equivalents: 0  Benzodiazepine: No.  Pain Psychology: N/A  [place] DOC: None  [place] database was reviewed 06/25/21.      Subjective:   HPI:  Elizabeth Browning is seen in consultation at the request of Arsenio Katz*  For evaluation and recommendations regarding Her chronic pain.     Today, the patient reports pain localized to her left buttock, left hip, and left knee. The patient has a history of hidradenitis which causes sacral pain. She has had multiple extractions in the past and her pain at the site of extraction has been constant. She describes the pain as aching, throbbing and tight around the site of the area. Ambulation and certain movements worsen her pain, sitting still and limiting movement relieves the pain but does not resolve the pain. She has been on percocet, morphine, Lyrica, and muscle relaxants in the past. She states that she noticed benefit with Lyrica but had to discontinue because she ran out of her script and was unable to get it refilled. She cannot recall her last dose of Lyrica but denies any adverse side effects. She doesn't recall the name of her muscle relaxant but does recall that it was not helpful. She denies trying topicals over the area but expresses interest.    She endorses left hip pain that is not associated with her hidradenitis. Her pain extends from her hip to her left knee but not past the level of the knee. No radiation into the groin. She denies any inciting events or injuries. Her pain has progressed over the last 6 months. She reports that when she walks she can feel pooping and crackling sensation. This pain worsens with ambulation and she has to adjust her gait to put less weight on her left hip and leg. She is currently ambulating with a walker to help with her gait because she often feels like her leg is going to give out due to pain. She has not had any imaging of her left hip. Nothing appears to make her pain better aside from staying sedentary. She must sit on a memory foam pillow to help with pain.     The patient is currently taking Aleve up to 8 times a day for her pain. She has used some Tylenol but she notices that she has to take a lot for her to feel any benefit. She reports that she knows she should not be taking 8 tabs of Aleve per day but it is the only medication she has at this time. She  underwent MBB's while she was in [place] but she had pain with the 3 injection to the point where she could not finish procedure.    Pain Clinic? No    Current Medications:  OTC Aleve    Her pain started approximately  6 months ago.  Her pain involving left lower back, hip, leg and knee.    Her pain is described as shock and stabbing  Elizabeth Browning is  presenting to the clinic today with pain that is graded as 10/10 in intensity. Her highest level of pain is reported as 10/10 in intensity, least pain level is 10/10 and average pain level is 10/10.  Her pain started Suddenly.  Her pain symptoms are associated with weakness and swelling.  The patient has not experienced lost of bowel or bladder control.  Her pain is made worse with exercise, sitting, lying, standing, walking and climbing stairs.  Her pain is present all of the time   Her pain is improved with medication.     Previous interventions include lumbar MBB's. No record per chart review, was done in a clinic in [place].       Previous Medication Trials: lyrica, oxycodone, trazodone, ibuprofen     Past Medical History:   Diagnosis Date   ??? Anesthesia to pain     difficulty relaxing and letting anesthesia work- anxiety noted   ??? Anxiety    ??? Arthritis    ??? Asthma    ??? Cancer (CMS-HCC)     cervical   ??? Chronic bronchitis (CMS-HCC)    ??? Chronic hepatitis C without hepatic coma (CMS-HCC)     cleared as of 10/25/16 per patient from [facility]   ??? Colon polyp    ??? Difficult intravenous access     requesting site to be numbed   ??? DOE (dyspnea on exertion)    ??? Dysthymia (RAF-HCC)    ??? Fatigue    ??? Foot pain    ??? GERD (gastroesophageal reflux disease) (RAF-HCC)    ??? Hepatitis C    ??? Loosening of knee joint prosthesis (CMS-HCC)     left   ??? Migraine    ??? MRSA (methicillin resistant staph aureus) culture positive     no + culture found- pt denies-current culture negative   ??? Neuropathic pain    ??? URI (upper respiratory infection)    ??? Vitamin D deficiency      Past Surgical History:   Procedure Laterality Date   ??? BUNIONECTOMY Bilateral    ??? CARPOMETACARPEL SUSPENSION PLASTY Bilateral     tendon repair   ??? HAND SURGERY Bilateral     trigger finger   ??? HYSTERECTOMY      partial   ??? JOINT REPLACEMENT Left     knee   ??? KNEE SURGERY Left     revision 8 months later post TKR   ??? MOUTH SURGERY      all teeth removed   ??? PR EXC SWEAT GLAND LESN PERINEAL,COMPLX Bilateral 04/06/2021    Procedure: EXCISION OF SKIN & SUBCUTANEOUS TISSUE FOR HIDRADENITIS, PERIANAL, PERINEAL, OR UMBILICAL; COMPLEX REPAIR;  Surgeon: Arsenio Katz, MD;  Location: MAIN OR [facility];  Service: Plastics   ??? PR EXC SWEAT GLAND LESN PERINEAL,SIMPL Right 03/09/2019    Procedure: R21 - EXCISION SKIN/SUBCUTANEOUS TISSUE FOR HIDRADENITIS, PERIANAL, PERINEAL, UMBILICAL; SIMPLE/INTERMEDIATE REPR;  Surgeon: Arsenio Katz, MD;  Location: MAIN OR [facility];  Service: Plastics   ??? PR EXC SWEAT GLAND LESN PERINEAL,SIMPL Left  09/14/2019    Procedure: R24, EXCISION SKIN/SUBCUTANEOUS TISSUE FOR HIDRADENITIS, PERIANAL, PERINEAL, UMBILICAL; SIMPLE/INTERMEDIATE REPR;  Surgeon: Arsenio Katz, MD;  Location: MAIN OR [facility];  Service: Plastics   ??? PR INCIS/DRAIN THIGH/KNEE ABSCESS,DEEP Left 11/11/2016    Procedure: I&D DEEP ABSCESS INFEC BURSA/HEMATOMA THIGH/KNEE;  Surgeon: Lowell Bouton, MD;  Location: OR [facility];  Service: Orthopedics   ??? PR REVISE KNEE JOINT REPLACE,ALL PARTS Left 11/04/2016    Procedure: REVISION LEFT TOTAL KNEE REPLACEMENT, FEMORAL AND TIBIAL COMPARTMENT;  Surgeon: Lowell Bouton, MD;  Location: OR [facility];  Service: Orthopedics   ??? TOE AMPUTATION Right     pinky    ??? TONSSILLECTOMY     ??? TUBAL LIGATION     ??? UMBILICAL HERNIA REPAIR      age 89     Family History   Problem Relation Age of Onset   ??? Hypertension Mother    ??? Diabetes Mother    ??? Hypertension Father    ??? Cancer Father         Leukemia   ??? Cancer Sister         breast     Social History:  She reports that she has quit smoking. Her smoking use included cigarettes. She has a 15.00 pack-year smoking history. She has never used smokeless tobacco. She reports that she does not drink alcohol and does not use drugs.    The patient is divorced  The patient has children: Yes, 3  The patient is living alone  Highest level of education: High school  Current Employment: Disabled  Occupation: na  Exercise: left blank  Recreational Drugs: No  Treatment for Substance abuse: No  Use anothers prescription medications: No    Allergies as of 06/25/2021 - Reviewed 06/25/2021   Allergen Reaction Noted   ??? Buprenorphine hcl Hives 01/24/2015   ??? Codeine Rash, Hives, and Itching 01/19/2015   ??? Adhesive Rash 12/26/2015   ??? Iodinated contrast media Rash 01/24/2015   ??? Latex Rash 01/24/2015   ??? Morphine Itching 12/07/2015   ??? Opioids - morphine analogues Itching 02/21/2012      Current Outpatient Medications   Medication Sig Dispense Refill   ??? ADVAIR DISKUS 250-50 mcg/dose diskus INHALE 1 PUFF BY MOUTH TWICE DAILY 12 HOURS APART     ??? albuterol HFA 90 mcg/actuation inhaler Inhale 2 puffs every four (4) hours as needed.     ??? atorvastatin (LIPITOR) 40 MG tablet Take 1 tablet by mouth in the morning.     ??? cefdinir (OMNICEF) 300 MG capsule Take 1 capsule (300 mg total) by mouth Two (2) times a day. 60 capsule 0   ??? DEXILANT 60 mg capsule      ??? empty container Misc use as directed 1 each 2   ??? empty container Misc Use as directed 1 each 2   ??? golimumab (SIMPONI) 100 mg/mL PnIj Inject the contents of 2 pens (200mg ) under the skin on week 0 and week 2 as loading doses. 4 mL 0   ??? golimumab 100 mg/mL PnIj Inject the contents of 2 pens (200mg ) under the skin every 4 weeks as maintenance. 2 mL 11   ??? HUMIRA PEN CITRATE FREE 40 MG/0.4 ML Inject the contents of 2 pens (80mg ) under the skin every 7 days as directed by prescriber 8 each 11   ??? hydroquinone 4 % cream Apply topically Two (2) times a day. Use for up to 12 weeks 30 g 3   ???  ibuprofen (MOTRIN) 800 MG tablet Take 800 mg by mouth every six (6) hours as needed.      ??? ipratropium-albuteroL (DUO-NEB) 0.5-2.5 mg/3 mL nebulizer USE 1 VIAL VIA NEBULIZER EVERY 4 TO 6 HOURS AS NEEDED FOR COUGH OR WHEEZING     ??? oxyCODONE (ROXICODONE) 5 MG immediate release tablet Take 1 tablet (5 mg total) by mouth every eight (8) hours as needed for pain for up to 10 doses. 10 tablet 0   ??? pantoprazole (PROTONIX) 20 MG tablet Take 40 mg by mouth in the morning.     ??? PREMARIN 0.625 mg/gram vaginal cream      ??? traZODone (DESYREL) 50 MG tablet Take 50-100 mg by mouth nightly.     ??? URO-MP 118-10-40.8-36 mg cap TAKE 1 CAPSULE BY MOUTH FOUR TIMES DAILY AS NEEDED     ??? pregabalin (LYRICA) 50 MG capsule Take 1 capsule (50 mg total) by mouth Three (3) times a day. 90 capsule 2     No current facility-administered medications for this visit.     Imaging/Tests:     Knee XRAY 12/30/16-  Standing AP of both knees and lateral of the left knee show a total knee replacement the implants appeared be well fixed. Special attention was paid to the femoral and tibial components that which have not change in alignment is no signs of ostial lysis or loosening of the implants certainly no fracture dislocation subluxation or tumor seen                  Lab Results   Component Value Date    CREATININE 0.89 (H) 06/15/2021     Lab Results   Component Value Date    ALKPHOS 87 03/13/2019    BILITOT 0.4 03/13/2019    BILIDIR <0.10 03/13/2019    PROT 8.0 03/13/2019    ALBUMIN 3.9 03/13/2019    ALT 8 (L) 06/15/2021    AST 17 06/15/2021     Lab Results   Component Value Date    PLT 505 (H) 06/15/2021     Urine toxiciology screen  No results found for: AMPHU, BARBU, BENZU, CANNAU, METHU, OPIAU, COCAU    OPIOID CONFIRMATION:  No results found for: CDIFFTOX, NBUPR, LABCO, HYDROCODONE, HYDROMORPH, MORPHINE, OXYCODONE, OXMU, MAMU, OPIU     BENZODIAZEPINE CONFIRMATION:  No results found for: CDIFFTOX, CDIFFTOX, OHALP, CLONU, HDXYFLRAZUR, 7NHFLU, DESALKYCONF, LORAZURQT, HDXYTRIAZUR, MIDAZURQT, BNZU    Review of Systems:  GENERAL: weight loss, weight gain, difficulty sleeping  HEENT: none  SKIN:none  PULMONARY:none  ENDOCRINE: increased sweating, decreased sex drive, decreased sexual performance  GASTROINTESTINAL:gastric reflux  GENITOURINARY:difficulty with controlling urine  MUSCULOSKELETAL:none  CARDIOVASCULAR:none  NEUROLOGIC:numbness/tingling  PSYCHIATRIC: anxiety, depression and panic attacks  She denies any homicidal or suicidal ideation.   HEMATOLOGIC: none  IMMUNOLOGIC: allergies    Objective:   PHYSICAL EXAM:  BP 124/75  - Pulse 82  - Temp 36.3 ??C (97.3 ??F) (Skin)  - Resp 16  - Ht 177.8 cm (5' 10)  - Wt (!) 107 kg (235 lb 14.4 oz)  - SpO2 (!) 8%  - BMI 33.85 kg/m??   Wt Readings from Last 3 Encounters:   06/25/21 (!) 107 kg (235 lb 14.4 oz)   06/15/21 (!) 108.2 kg (238 lb 9.6 oz)   06/04/21 (!) 109.9 kg (242 lb 4.8 oz)     GENERAL: Well developed, well-nourished obese female and is in no apparent distress. The patient is pleasant and interactive. Patient is a good historian.  No evidence of sedation or intoxication.  No overt pain behaviors  HEENT: Normocephalic/atraumatic. Clear sclera.   CARDIOVASCULAR:  Warm, well perfused.  RESPIRATORY:  Normal work of breathing, no supplemental 02.  EXTREMITIES: No clubbing, cyanosis noted.  Joints appear normal.  GASTROINTESTINAL: Soft, nondistended  NEUROLOGIC: Alert and oriented, speech fluent, normal language. Cranial nerves grossly intact.  Sensation Deferred.  MUSCULOSKELETAL: Patient rises from a seated position with minimal difficulty.  The patient was able to ambulate with minimal difficulty throughout the clinic today with the assistance of a walking aid-Walker.   SPINE:TTP over the sacrum. Negative TTP over the lumbar spine. Negative facet loading maneuver bilaterally. Negative TTP over SIJ. Negative fortin's finger. TTP over the left GTB. Positive leg rolling on the left. Negative seated slump bilaterally. Unable to perform faber bilaterally.   SKIN: No obvious rashes lesions or erythema on the exposed skin  PSYCHIATRIC: Appropriate, full range affect, no psychomotor retardation    I have personally reviewed the patient's medical record.   I have personally reviewed the patient's clinical labs and/or urine toxicology studies  I have independently reviewed patient's imaging studies.   I have not requested patient's old medical records from the prior provider.   The patient's significant other and/or family member/friend was not present during this visit and has contributed important medical information as pertinent to patient's medical history.   I have reviewed the [place] Narcotic Database today    Documentation assistance was provided by Ranelle Oyster Scribe, on June 25, 2021 at 8:20 AM for Dr. Lynnda Shields, MD.    I saw and examined the patient. I reviewed the Scribe's note and agree with the findings and plan of care as transcribed.    Karma Greaser, MD  PGY-5   Department of Anesthesiology   Pain Medicine Division

## 2021-06-25 ENCOUNTER — Ambulatory Visit: Admit: 2021-06-25 | Discharge: 2021-06-26 | Payer: MEDICAID | Attending: Anesthesiology | Primary: Anesthesiology

## 2021-06-25 DIAGNOSIS — L732 Hidradenitis suppurativa: Principal | ICD-10-CM

## 2021-06-25 DIAGNOSIS — M25552 Pain in left hip: Principal | ICD-10-CM

## 2021-06-25 MED ORDER — PREGABALIN 50 MG CAPSULE
ORAL_CAPSULE | Freq: Three times a day (TID) | ORAL | 2 refills | 30.00000 days | Status: CP
Start: 2021-06-25 — End: 2021-06-25

## 2021-06-25 NOTE — Unmapped (Addendum)
It was nice to meet you today!  - We would like you to restart Lyrica 50mg  three times a day. Please take your first dose at night.   - We will place an order for a left hip xray for you to complete prior to your appointment with orthopedics in January  - We will plan to follow-up in 6-8 weeks

## 2021-06-27 NOTE — Unmapped (Signed)
Addended by: Carolynne Edouard on: 06/27/2021 03:57 PM     Modules accepted: Level of Service

## 2021-06-29 ENCOUNTER — Ambulatory Visit: Admit: 2021-06-29 | Discharge: 2021-06-30 | Payer: MEDICAID

## 2021-06-29 LAB — CBC W/ AUTO DIFF
BASOPHILS ABSOLUTE COUNT: 0.1 10*9/L (ref 0.0–0.1)
BASOPHILS RELATIVE PERCENT: 1.1 %
EOSINOPHILS ABSOLUTE COUNT: 0.2 10*9/L (ref 0.0–0.5)
EOSINOPHILS RELATIVE PERCENT: 2.4 %
HEMATOCRIT: 38.8 % (ref 34.0–44.0)
HEMOGLOBIN: 12.6 g/dL (ref 11.3–14.9)
LYMPHOCYTES ABSOLUTE COUNT: 4.7 10*9/L — ABNORMAL HIGH (ref 1.1–3.6)
LYMPHOCYTES RELATIVE PERCENT: 55.1 %
MEAN CORPUSCULAR HEMOGLOBIN CONC: 32.6 g/dL (ref 32.0–36.0)
MEAN CORPUSCULAR HEMOGLOBIN: 28 pg (ref 25.9–32.4)
MEAN CORPUSCULAR VOLUME: 86 fL (ref 77.6–95.7)
MEAN PLATELET VOLUME: 7.7 fL (ref 6.8–10.7)
MONOCYTES ABSOLUTE COUNT: 0.6 10*9/L (ref 0.3–0.8)
MONOCYTES RELATIVE PERCENT: 7.4 %
NEUTROPHILS ABSOLUTE COUNT: 2.9 10*9/L (ref 1.8–7.8)
NEUTROPHILS RELATIVE PERCENT: 34 %
PLATELET COUNT: 423 10*9/L (ref 150–450)
RED BLOOD CELL COUNT: 4.51 10*12/L (ref 3.95–5.13)
RED CELL DISTRIBUTION WIDTH: 13 % (ref 12.2–15.2)
WBC ADJUSTED: 8.5 10*9/L (ref 3.6–11.2)

## 2021-06-29 LAB — CREATININE
CREATININE: 1.09 mg/dL — ABNORMAL HIGH
EGFR CKD-EPI (2021) FEMALE: 60 mL/min/{1.73_m2} (ref >=60)

## 2021-06-29 LAB — C-REACTIVE PROTEIN: C-REACTIVE PROTEIN: <4 mg/L (ref <=10.0)

## 2021-06-29 LAB — AST: AST (SGOT): 21 U/L (ref <=34)

## 2021-06-29 LAB — SEDIMENTATION RATE: ERYTHROCYTE SEDIMENTATION RATE: 43 mm/h — ABNORMAL HIGH (ref 0–30)

## 2021-06-29 LAB — ALT: ALT (SGPT): 11 U/L (ref 10–49)

## 2021-06-29 LAB — BUN: BLOOD UREA NITROGEN: 9 mg/dL (ref 9–23)

## 2021-06-29 MED ADMIN — methylPREDNISolone sodium succinate (PF) (Solu-MEDROL) injection 20 mg: 20 mg | INTRAVENOUS | @ 15:00:00 | Stop: 2021-06-29

## 2021-06-29 MED ADMIN — cetirizine (ZyrTEC) tablet 10 mg: 10 mg | ORAL | @ 15:00:00 | Stop: 2021-06-29

## 2021-06-29 MED ADMIN — inFLIXimab-axxq (AVSOLA) 5 mg/kg = 500 mg in sodium chloride (NS) 250 mL IVPB: 5 mg/kg | INTRAVENOUS | @ 15:00:00 | Stop: 2021-06-29

## 2021-06-29 MED ADMIN — acetaminophen (TYLENOL) tablet 650 mg: 650 mg | ORAL | @ 15:00:00 | Stop: 2021-06-29

## 2021-06-29 NOTE — Unmapped (Signed)
Pt presents for [facility] infusion. Pt denies any recent infection, VSS. PIV placed in LAC per patient request, labs drawn, premeds administered. Pt aware of potential reaction/side effects, call bell within reach. Snacks, drink, and blanket provided for patient comfort.     1024 Remicade 500mg  started, to infuse at the following rates:    37ml/hr for 15 min  65ml/hr for 15 min  64ml/hr for 15 min  39ml/hr for 15 min  111ml/hr for 30 min  248ml/hr until complete.    1133 Patient up to the bathroom.     1228 Infusion complete. Pt tolerated without complication, VSS. IV flushed per policy and d/c'd, gauze and coban applied. Pt left clinic in no acute distress.

## 2021-07-01 NOTE — Unmapped (Signed)
CC: Post op    HPI:  [name] is a 55 y.o. female who presents for post op check after post op check after excision of??buttock/sacral hidradenitis measuring 12 cm x 8 cm; left thigh hidradenitis excision measuring 15x 10 cm on 04/06/21.     Since last being seen, she has been applying vaseline to her healing wounds. She reports continued pain but denies any infectious signs at the site of her wounds. She reports that they continue to heal but she wishes they would heal faster.       Past Medical History:  Past Medical History:   Diagnosis Date   ??? Anesthesia to pain     difficulty relaxing and letting anesthesia work- anxiety noted   ??? Anxiety    ??? Arthritis    ??? Asthma    ??? Cancer (CMS-HCC)     cervical   ??? Chronic bronchitis (CMS-HCC)    ??? Chronic hepatitis C without hepatic coma (CMS-HCC)     cleared as of 10/25/16 per patient from [facility]   ??? Colon polyp    ??? Difficult intravenous access     requesting site to be numbed   ??? DOE (dyspnea on exertion)    ??? Dysthymia (RAF-HCC)    ??? Fatigue    ??? Foot pain    ??? GERD (gastroesophageal reflux disease) (RAF-HCC)    ??? Hepatitis C    ??? Loosening of knee joint prosthesis (CMS-HCC)     left   ??? Migraine    ??? MRSA (methicillin resistant staph aureus) culture positive     no + culture found- pt denies-current culture negative   ??? Neuropathic pain    ??? URI (upper respiratory infection)    ??? Vitamin D deficiency        Past Surgical History:  Past Surgical History:   Procedure Laterality Date   ??? BUNIONECTOMY Bilateral    ??? CARPOMETACARPEL SUSPENSION PLASTY Bilateral     tendon repair   ??? HAND SURGERY Bilateral     trigger finger   ??? HYSTERECTOMY      partial   ??? JOINT REPLACEMENT Left     knee   ??? KNEE SURGERY Left     revision 8 months later post TKR   ??? MOUTH SURGERY      all teeth removed   ??? PR EXC SWEAT GLAND LESN PERINEAL,COMPLX Bilateral 04/06/2021    Procedure: EXCISION OF SKIN & SUBCUTANEOUS TISSUE FOR HIDRADENITIS, PERIANAL, PERINEAL, OR UMBILICAL; COMPLEX REPAIR;  Surgeon: Arsenio Katz, MD;  Location: MAIN OR [facility];  Service: Plastics   ??? PR EXC SWEAT GLAND LESN PERINEAL,SIMPL Right 03/09/2019    Procedure: R21 - EXCISION SKIN/SUBCUTANEOUS TISSUE FOR HIDRADENITIS, PERIANAL, PERINEAL, UMBILICAL; SIMPLE/INTERMEDIATE REPR;  Surgeon: Arsenio Katz, MD;  Location: MAIN OR [facility];  Service: Plastics   ??? PR EXC SWEAT GLAND LESN PERINEAL,SIMPL Left 09/14/2019    Procedure: R24, EXCISION SKIN/SUBCUTANEOUS TISSUE FOR HIDRADENITIS, PERIANAL, PERINEAL, UMBILICAL; SIMPLE/INTERMEDIATE REPR;  Surgeon: Arsenio Katz, MD;  Location: MAIN OR [facility];  Service: Plastics   ??? PR INCIS/DRAIN THIGH/KNEE ABSCESS,DEEP Left 11/11/2016    Procedure: I&D DEEP ABSCESS INFEC BURSA/HEMATOMA THIGH/KNEE;  Surgeon: Lowell Bouton, MD;  Location: OR [facility];  Service: Orthopedics   ??? PR REVISE KNEE JOINT REPLACE,ALL PARTS Left 11/04/2016    Procedure: REVISION LEFT TOTAL KNEE REPLACEMENT, FEMORAL AND TIBIAL COMPARTMENT;  Surgeon: Lowell Bouton, MD;  Location: OR [facility];  Service: Orthopedics   ???  TOE AMPUTATION Right     pinky    ??? TONSSILLECTOMY     ??? TUBAL LIGATION     ??? UMBILICAL HERNIA REPAIR      age 63       Medications:  Current Outpatient Medications   Medication Sig Dispense Refill   ??? ADVAIR DISKUS 250-50 mcg/dose diskus INHALE 1 PUFF BY MOUTH TWICE DAILY 12 HOURS APART     ??? albuterol HFA 90 mcg/actuation inhaler Inhale 2 puffs every four (4) hours as needed.     ??? atorvastatin (LIPITOR) 40 MG tablet Take 1 tablet by mouth in the morning.     ??? cefdinir (OMNICEF) 300 MG capsule Take 1 capsule (300 mg total) by mouth Two (2) times a day. 60 capsule 0   ??? DEXILANT 60 mg capsule      ??? empty container Misc use as directed 1 each 2   ??? empty container Misc Use as directed 1 each 2   ??? golimumab (SIMPONI) 100 mg/mL PnIj Inject the contents of 2 pens (200mg ) under the skin on week 0 and week 2 as loading doses. 4 mL 0   ??? golimumab 100 mg/mL PnIj Inject the contents of 2 pens (200mg ) under the skin every 4 weeks as maintenance. 2 mL 11   ??? hydroquinone 4 % cream Apply topically Two (2) times a day. Use for up to 12 weeks 30 g 3   ??? ibuprofen (MOTRIN) 800 MG tablet Take 800 mg by mouth every six (6) hours as needed.      ??? ipratropium-albuteroL (DUO-NEB) 0.5-2.5 mg/3 mL nebulizer USE 1 VIAL VIA NEBULIZER EVERY 4 TO 6 HOURS AS NEEDED FOR COUGH OR WHEEZING     ??? oxyCODONE (ROXICODONE) 5 MG immediate release tablet Take 1 tablet (5 mg total) by mouth every eight (8) hours as needed for pain for up to 10 doses. 10 tablet 0   ??? pantoprazole (PROTONIX) 20 MG tablet Take 40 mg by mouth in the morning.     ??? pregabalin (LYRICA) 50 MG capsule Take 1 capsule (50 mg total) by mouth Three (3) times a day. 90 capsule 2   ??? PREMARIN 0.625 mg/gram vaginal cream      ??? traZODone (DESYREL) 50 MG tablet Take 50-100 mg by mouth nightly.     ??? URO-MP 118-10-40.8-36 mg cap TAKE 1 CAPSULE BY MOUTH FOUR TIMES DAILY AS NEEDED       No current facility-administered medications for this visit.       Allergies:  Allergies   Allergen Reactions   ??? Buprenorphine Hcl Hives     Unknown-pt denies   ??? Codeine Rash, Hives and Itching   ??? Adhesive Rash   ??? Iodinated Contrast Media Rash   ??? Latex Rash   ??? Morphine Itching   ??? Opioids - Morphine Analogues Itching     Unknown-pt denies         Family History:   Family History   Problem Relation Age of Onset   ??? Hypertension Mother    ??? Diabetes Mother    ??? Hypertension Father    ??? Cancer Father         Leukemia   ??? Cancer Sister         breast    The patient reports no family history for bleeding disorders or anesthetic problems.    Social History:  Social History     Socioeconomic History   ??? Marital status: Single  Tobacco Use   ??? Smoking status: Former     Packs/day: 0.50     Years: 30.00     Pack years: 15.00     Types: Cigarettes   ??? Smokeless tobacco: Never   ??? Tobacco comments:     started smoking age as a teen   Substance and Sexual Activity   ??? Alcohol use: No   ??? Drug use: No   Other Topics Concern   ??? Do you use sunscreen? No   ??? Tanning bed use? No   ??? Are you easily burned? No   ??? Excessive sun exposure? No   ??? Blistering sunburns? No       ROS:   Otherwise, 12 point review of systems was completed and is negative except as per HPI.      Vitals:   There were no vitals filed for this visit.  ?  Physical Exam:   Gen: AA in NAD  HEENT: NCAT, EOMI, PERRL, Non icteric sclerae, MMM  CV: RRR  RESP: quiet respirations on room air  EXT: L thigh with 6cm by 2cm area of healthy granulation tissue  BUTTOCKS: Midline wound just superior to the gluteal cleft and left thigh wound now almost flush with the surrounding skin with healthy granulation tissue that is reepithelializing from the periphery. No drainage or malodor     Impression and Plan:  Ugochi Henzler is here today for post op check after excision of??buttock/sacral hidradenitis measuring 12 cm x 8 cm; left thigh hidradenitis excision measuring 15x 10 cm on 04/06/21. Doing well.     -xeroform dressings, change daily  -f/u 4 weeks, will discuss further surgery at that time

## 2021-07-02 ENCOUNTER — Ambulatory Visit: Admit: 2021-07-02 | Discharge: 2021-07-03 | Payer: MEDICAID

## 2021-07-02 DIAGNOSIS — L732 Hidradenitis suppurativa: Principal | ICD-10-CM

## 2021-07-02 NOTE — Unmapped (Signed)
APPOINTMENTS / QUESTIONS  For questions regarding appointments or your surgical care, Monday through Friday, 8 am to 4:30 pm    [facility]  [address],   [place] [address]  T: [phone]  F: [phone]    Ambulatory Surgery Center  1st Floor, Ambulatory Care Center  [address]  [place], [place] [address]  T: [phone]  Website:  [facility]    [facility]  [address] [place], [place] [address]  Ground Floor [facility]  T: [phone]  F: [phone]    [facility]  [address], [place], [place] [address]  T: [phone]  F: [phone]    [facility]  [address], [place], [place] [address]   1st Floor [facility]  T: [phone]  F: [phone]    [facility]  [address] [place], [place] [address]  Third Floor, [address]  T: [phone]   F: [phone]    Administrative Office  [address]  [place], [place] [address]  T:  [phone]  F:  [phone]      Physicians:  [name] MD:   Nurse:  Vista Mink BSN, RN [phone]  [email or web address]   Surgery Scheduler: Lysle Morales  [phone] [email or web address]    Tarry Kos MD:   Nurse:  Elisabeth Pigeon BSN, RN, CPSN [phone] [email or web address]  Surgery Scheduler:   Lysle Morales  [phone] [email or web address]    Adeyemi Clydie Braun MD:  Nurse: Mila Palmer BSN, RN, [facility]  [phone] [email or web address]  Surgery Scheduler: Kristine Royal [phone] [email or web address]    Darleene Cleaver MD:  Nurse: Venetia Maxon BSN, RN, CPSN  [phone]  [email or web address]  Surgery Scheduler: Kristine Royal [phone] [email or web address]    Advance Practice Providers:  Ezekiel Slocumb, DNP, NP-C:   Nurse: Britt Boozer, MSN. RN [phone]  [email or web address]  Surgery Scheduler: Kristine Royal [phone] [email or web address]    Rush Landmark, PA-C:   Nurse: Venetia Maxon BSN, RN, CPSN  [phone] [email or web address]  Surgery Scheduler: Kristine Royal [phone] [email or web address]    Financial Counselor:   Deloria Lair  T: [phone]  F: [phone]  E-mail:  [email or web address]     Surgery Scheduling:  You will receive a phone call from the surgery schedulers regarding date for surgery in 1-2 weeks from  your appointment.    Please call one of the above numbers if you have not received a phone call 2 weeks after your appointment with the provider.      [facility] Contact Information: Please call to schedule  *Radiology (CT, X-ray, Ultrasound) [phone]  *Mammography & Breast Ultrasound [phone]  *MRI [phone]  *Interventional Radiology [phone]    AFTER HOURS/HOLIDAYS:  For emergencies after-hours (after 5pm, or weekends): call the [facility] operator [phone]) and ask to page the Plastic Surgery resident on call. You will be directed to a surgery resident who likely is not immediately aware of the details of your case, but can help you deal with any emergencies that cannot wait until regular business hours.  Please be aware that this person is responding to many in-hospital emergencies and patient issues and may not answer your phone call immediately, but will return your call as soon as possible.    Billing Questions/Financial Navigation:  [phone]    Precare Location:  [facility] Pre-Procedure Services at [facility])   [address]  [place], [place] [address] [facility] -  old [address]ide [address] near [employer])    Blood Work Location:  [address], [place] [address]. There are no suite numbers, but it is located on the first floor there. Go to the main desk and tell them you are there for walk-in labs.  Precare:   The day prior to your scheduled surgery, Pre-Care will call you with instructions.  If you have not heard from them by 4PM, and would like to check on the status of your surgery, please call:  [facility]: [phone]  [facility]: [phone]  [facility]: [phone]  [place]:  [phone]    Minor Procedure Room:  Please do not wear any jewelry to your procedure.    Weather Hotline:  [phone]    FLMA forms and paperwork:  Please allow  a 2 week turn around for forms to be completed and faxed.

## 2021-07-02 NOTE — Unmapped (Signed)
Per verbal order: xeroform placed on wounds, guaze and abd pads secured with tape. Pt tolerated dressing well and stated good understated of dressings going forward.

## 2021-07-13 NOTE — Unmapped (Signed)
[facility] Refill Coordination Note    Specialty Medication(s) to be Shipped:   Inflammatory Disorders: Simponi    Other medication(s) to be shipped: No additional medications requested for fill at this time     Elizabeth Browning, DOB: 08/22/1965  Phone: [phone] (home)       All above HIPAA information was verified with patient.     Was a Nurse, learning disability used for this call? No    Completed refill call assessment today to schedule patient's medication shipment from the [facility] [phone]).  All relevant notes have been reviewed.     Specialty medication(s) and dose(s) confirmed: Regimen is correct and unchanged.   Changes to medications: Elizabeth Browning reports no changes at this time.  Changes to insurance: No  New side effects reported not previously addressed with a pharmacist or physician: None reported  Questions for the pharmacist: No    Confirmed patient received a Conservation officer, historic buildings and a Surveyor, mining with first shipment. The patient will receive a drug information handout for each medication shipped and additional FDA Medication Guides as required.       DISEASE/MEDICATION-SPECIFIC INFORMATION        For patients on injectable medications: Patient currently has 0 doses left.  Next injection is scheduled for 07/18/2021.    SPECIALTY MEDICATION ADHERENCE     Medication Adherence    Patient reported X missed doses in the last month: 0  Specialty Medication: SIMPONI 100 mg/mL  Patient is on additional specialty medications: No  Any gaps in refill history greater than 2 weeks in the last 3 months: no  Demonstrates understanding of importance of adherence: yes  Informant: patient  Reliability of informant: reliable  Confirmed plan for next specialty medication refill: delivery by pharmacy  Refills needed for supportive medications: not needed              Were doses missed due to medication being on hold? No    Simponi 100 mg/ml: 0 days of medicine on hand REFERRAL TO PHARMACIST     Referral to the pharmacist: Not needed      [facility]     Shipping address confirmed in Epic.     Delivery Scheduled: Yes, Expected medication delivery date: 07/18/2021.     Medication will be delivered via UPS to the prescription address in Epic WAM.    Fortune Brannigan D Shamra Bradeen   [facility] Specialty Technician

## 2021-07-17 MED FILL — SIMPONI 100 MG/ML SUBCUTANEOUS PEN INJECTOR: 28 days supply | Qty: 2 | Fill #9

## 2021-07-17 NOTE — Unmapped (Signed)
CC: Pain of the Left Hip     HPI:  56 y.o. female presents for evaluation of left hip. She reports a left groin pain and radiating pain down to her foot for the past 6 months or longer.  No specific injury.  Describes pain at rest and with any types of activities including weightbearing and sitting.  It is waking her up at night.  She has tried Aleve, gabapentin, and Lyrica all with minimal improvement.  She has no history of hip injections.  She has had epidural steroid injections in the past with no relief.    She also has a history of left total knee arthroplasty and 2 subsequent revisions.  Most recent was approximately 5 years ago at [facility].  Denies any infectious history.    She also had a history of excision of??buttock/sacral??hidradenitis on 04/06/21.     ROS: Negative for fever, chills, chest pain, cough, SOB.    PMH/PSH:  -Hepatitis C, treated  -Anxiety  -Asthma  -History of cervical cancer  -History of MRSA infection  -Hidradenitis    Social Hx:  Tobacco Use: Medium Risk   ??? Smoking Tobacco Use: Former   ??? Smokeless Tobacco Use: Never   ??? Passive Exposure: Not on file      Dental: Has full dentures    Physical Exam:  General: Alert, no acute distress.   Estimated body mass index is 35.65 kg/m?? as calculated from the following:    Height as of this encounter: 174 cm (5' 8.5).    Weight as of this encounter: 107.9 kg (237 lb 14.4 oz).   Respiratory: Non labored respirations.  Integumentary: No obvious rashes, lesions, or open wounds.   Musculoskeletal: Ambulating independently with antalgic gait and a Rollator walker.  Has difficulty weightbearing on the left side. LLE: No open wounds or erythema. Trochanteric bursa tender.  Painful internal and external rotation of hip with unrestricted ROM. 5/5 strength with hip adduction, abduction, and flexion. NV intact distally.     Imaging:  Radiographs of left hip obtained and reviewed showing mild degenerative changes. No evidence of fracture or dislocation.  Partial view of lumbar spine showing significant degenerative changes    Assessment:  Degenerative disc disease, lumbosacral  Left hip pain in the setting of mild osteoarthritis    Plan:  57 y.o. female with about 6 months of worsening left hip/buttock/back pain.  Difficult to assess the underlying cause, however reassured the patient that I do not see any significant osteoarthritis that would indicate the need for total hip arthroplasty.  Her hip radiographs do not correspond with the severity of pain she is demonstrated today.  After thorough discussion with the patient we decided on the following:    1. MRI to rule out stress fracture based on severity of symptoms and pain with weightbearing  2. Scheduled for left hip intraarticular steroid injection -- this will be both diagnostic and hopefully therapeutic  3. Referral to spine clinic for further work up after MRI and injection as my suspicious is that a large amount of her pain is originating from her spinal disease    Follow up: with sports med team for hip injection and spine team for spine eval. Patient understands that I will reach out with MRI results only if there are any pertinent findings.     Patient demonstrated understanding and was in agreement with the plan outlined above.    This note was transcribed with the use of dictation software,  please excuse any typos or grammatical errors.    DME ORDER:  Dx:  ,

## 2021-07-18 ENCOUNTER — Ambulatory Visit: Admit: 2021-07-18 | Discharge: 2021-07-19 | Payer: MEDICAID

## 2021-07-18 NOTE — Unmapped (Signed)
MRI Scheduling and follow up instructions:     Your [facility] Provider has ordered an MRI test for you. The Radiology Department should call you directly in 2-3 business days to get this test scheduled. If you do not hear from the Radiology Department by this time, please feel free to call them , by calling: [phone], option 1.        Thank you ,     [facility]    Thank you for choosing [facility]!  We appreciate the opportunity to participate in your care.       If any questions or concerns arise after your visit, please do not hesitant to contact me by [facility] or by calling the total joint team at [phone] or [phone].       Voicemail messages: Messages are checked between 8:00 am- 4:00 pm Monday- Friday.     MyChart messages: These messages are checked by the nurses during normal business hours 8:30 am-4:30 pm Monday-Friday every 24-48 hours and are for non-urgent, non-emergent concerns. You may be asked to return for a follow up visit if it is deemed your questions are best handled in the clinic setting.     Please let me know if I can be of assistance with this or other orthopaedic issues in the future.

## 2021-07-28 DIAGNOSIS — L732 Hidradenitis suppurativa: Principal | ICD-10-CM

## 2021-08-03 NOTE — Unmapped (Signed)
APPOINTMENTS / QUESTIONS  For questions regarding appointments or your surgical care, Monday through Friday, 8 am to 4:30 pm    [facility]  [address],   [place] [address]  T: [phone]  F: [phone]    Ambulatory Surgery Center  1st Floor, Ambulatory Care Center  [address]  [place], [place] [address]  T: [phone]  Website:  [facility]    [facility]  [address], [place], [place] [address]  Ground Floor [facility]  T: [phone]  F: [phone]    [facility]  [address] [place], [place] [address]  T: [phone]  F: [phone]    [facility]  [address], [place], [place] [address]   1st Floor [facility]  T: [phone]  F: [phone]    [facility]  [address] [place], [place] [address]  Third Floor, [address]  T: [phone]   F: [phone]    Administrative Office  [address]  [place], [place] [address]  T:  [phone]  F:  [phone]      Physicians:  [name] MD:   Nurse:  Vista Mink BSN, RN [phone]  [email or web address]   Surgery Scheduler: Lysle Morales  [phone] [email or web address]    Tarry Kos MD:   Nurse:  Elisabeth Pigeon BSN, RN, CPSN [phone] [email or web address]  Surgery Scheduler:   Lysle Morales  [phone] [email or web address]    Adeyemi Clydie Braun MD:  Nurse: Jasmine December BSN, RN, [facility]  [phone] [email or web address]  Surgery Scheduler: Kristine Royal [phone] [email or web address]    Darleene Cleaver MD:  Nurse: Venetia Maxon BSN, RN, CPSN  [phone]  [email or web address]  Surgery Scheduler: Kristine Royal [phone] [email or web address]    Advance Practice Providers:  Ezekiel Slocumb, DNP, NP-C:   Surgery Scheduler: Kristine Royal [phone] [email or web address]    Rush Landmark, PA-C:   Surgery Scheduler: Kristine Royal [phone] [email or web address]    Financial Counselor:   Deloria Lair  T: [phone]  F: [phone]  E-mail:  [email or web address]     Surgery Scheduling:  You will receive a phone call from the surgery schedulers regarding date for surgery in 1-2 weeks from  your appointment.    Please call one of the above numbers if you have not received a phone call 2 weeks after your appointment with the provider.      [facility] Contact Information: Please call to schedule  *Radiology (CT, X-ray, Ultrasound) [phone]  *Mammography & Breast Ultrasound [phone]  *MRI [phone]  *Interventional Radiology [phone]    AFTER HOURS/HOLIDAYS:  For emergencies after-hours (after 5pm, or weekends): call the [facility] operator [phone]) and ask to page the Plastic Surgery resident on call. You will be directed to a surgery resident who likely is not immediately aware of the details of your case, but can help you deal with any emergencies that cannot wait until regular business hours.  Please be aware that this person is responding to many in-hospital emergencies and patient issues and may not answer your phone call immediately, but will return your call as soon as possible.    Billing Questions/Financial Navigation:  [phone]    Precare Location:  [facility] Pre-Procedure Services at [facility])   [address]  [place], [place] [address] ([address] - old [employer] [address] [address] near [employer])    Blood Work Location:  [address], [place]  [address]. There are no suite numbers, but it is located on the first floor there. Go to the main desk and tell them you are there for walk-in labs.  Precare:   The day prior to your scheduled surgery, Pre-Care will call you with instructions.  If you have not heard from them by 4PM, and would like to check on the status of your surgery, please call:  [facility]: [phone]  [facility]: [phone]  [facility]: [phone]  [place]:  [phone]    Minor Procedure Room:  Please do not wear any jewelry to your procedure.    [facility] Hotline:  [phone]    [facility] forms and paperwork:  Please allow  a 2 week turn around for forms to be completed and faxed.

## 2021-08-06 ENCOUNTER — Ambulatory Visit: Admit: 2021-08-06 | Discharge: 2021-08-07 | Payer: MEDICAID

## 2021-08-06 DIAGNOSIS — L732 Hidradenitis suppurativa: Principal | ICD-10-CM

## 2021-08-06 NOTE — Unmapped (Signed)
Per verbal order: xeroform and abd placed on posterior left thigh/buttocks area. Secured with tape. Pt tolerated well and stated good understanding of dressing changes going forward.

## 2021-08-06 NOTE — Unmapped (Signed)
CC: Post op    HPI:  Elizabeth Browning is a 56 y.o. female who presents for post op check after post op check after excision of??buttock/sacral hidradenitis measuring 12 cm x 8 cm; left thigh hidradenitis excision measuring 15x 10 cm on 04/06/21.     Since last being seen, patient states her buttocks wound has completely healed. She said she thinks the left thigh might infected because it has been incredibly itchy. It wakes her up at night. Denies fevers and chills.     She is interested in the resection of the areas on the breasts from the NAC inferiorly.  We have spoken about this previously in the setting of the most aesthetic way to resect this area would be in the form of a superomedial pedicle wise pattern breast reduction.  She is interested in pursuing this.      She also asks to have forms filled out for home care.      Past Medical History:  Past Medical History:   Diagnosis Date   ??? Anesthesia to pain     difficulty relaxing and letting anesthesia work- anxiety noted   ??? Anxiety    ??? Arthritis    ??? Asthma    ??? Cancer (CMS-HCC)     cervical   ??? Chronic bronchitis (CMS-HCC)    ??? Chronic hepatitis C without hepatic coma (CMS-HCC)     cleared as of 10/25/16 per patient from [facility]   ??? Colon polyp    ??? Difficult intravenous access     requesting site to be numbed   ??? DOE (dyspnea on exertion)    ??? Dysthymia (RAF-HCC)    ??? Fatigue    ??? Foot pain    ??? GERD (gastroesophageal reflux disease) (RAF-HCC)    ??? Hepatitis C    ??? Loosening of knee joint prosthesis (CMS-HCC)     left   ??? Migraine    ??? MRSA (methicillin resistant staph aureus) culture positive     no + culture found- pt denies-current culture negative   ??? Neuropathic pain    ??? URI (upper respiratory infection)    ??? Vitamin D deficiency        Past Surgical History:  Past Surgical History:   Procedure Laterality Date   ??? BUNIONECTOMY Bilateral    ??? CARPOMETACARPEL SUSPENSION PLASTY Bilateral     tendon repair   ??? HAND SURGERY Bilateral trigger finger   ??? HYSTERECTOMY      partial   ??? JOINT REPLACEMENT Left     knee   ??? KNEE SURGERY Left     revision 8 months later post TKR   ??? MOUTH SURGERY      all teeth removed   ??? PR EXC SWEAT GLAND LESN PERINEAL,COMPLX Bilateral 04/06/2021    Procedure: EXCISION OF SKIN & SUBCUTANEOUS TISSUE FOR HIDRADENITIS, PERIANAL, PERINEAL, OR UMBILICAL; COMPLEX REPAIR;  Surgeon: Arsenio Katz, MD;  Location: MAIN OR [facility];  Service: Plastics   ??? PR EXC SWEAT GLAND LESN PERINEAL,SIMPL Right 03/09/2019    Procedure: R21 - EXCISION SKIN/SUBCUTANEOUS TISSUE FOR HIDRADENITIS, PERIANAL, PERINEAL, UMBILICAL; SIMPLE/INTERMEDIATE REPR;  Surgeon: Arsenio Katz, MD;  Location: MAIN OR [facility];  Service: Plastics   ??? PR EXC SWEAT GLAND LESN PERINEAL,SIMPL Left 09/14/2019    Procedure: R24, EXCISION SKIN/SUBCUTANEOUS TISSUE FOR HIDRADENITIS, PERIANAL, PERINEAL, UMBILICAL; SIMPLE/INTERMEDIATE REPR;  Surgeon: Arsenio Katz, MD;  Location: MAIN OR [facility];  Service: Plastics   ??? PR INCIS/DRAIN THIGH/KNEE ABSCESS,DEEP  Left 11/11/2016    Procedure: I&D DEEP ABSCESS INFEC BURSA/HEMATOMA THIGH/KNEE;  Surgeon: Lowell Bouton, MD;  Location: OR [facility];  Service: Orthopedics   ??? PR REVISE KNEE JOINT REPLACE,ALL PARTS Left 11/04/2016    Procedure: REVISION LEFT TOTAL KNEE REPLACEMENT, FEMORAL AND TIBIAL COMPARTMENT;  Surgeon: Lowell Bouton, MD;  Location: OR [facility];  Service: Orthopedics   ??? TOE AMPUTATION Right     pinky    ??? TONSSILLECTOMY     ??? TUBAL LIGATION     ??? UMBILICAL HERNIA REPAIR      age 35       Medications:  Current Outpatient Medications   Medication Sig Dispense Refill   ??? ADVAIR DISKUS 250-50 mcg/dose diskus INHALE 1 PUFF BY MOUTH TWICE DAILY 12 HOURS APART     ??? albuterol HFA 90 mcg/actuation inhaler Inhale 2 puffs every four (4) hours as needed.     ??? atorvastatin (LIPITOR) 40 MG tablet Take 1 tablet by mouth in the morning.     ??? cefdinir (OMNICEF) 300 MG capsule Take 1 capsule (300 mg total) by mouth Two (2) times a day. 60 capsule 0   ??? DEXILANT 60 mg capsule      ??? empty container Misc use as directed 1 each 2   ??? empty container Misc Use as directed 1 each 2   ??? golimumab (SIMPONI) 100 mg/mL PnIj Inject the contents of 2 pens (200mg ) under the skin on week 0 and week 2 as loading doses. 4 mL 0   ??? golimumab 100 mg/mL PnIj Inject the contents of 2 pens (200mg ) under the skin every 4 weeks as maintenance. 2 mL 11   ??? hydroquinone 4 % cream Apply topically Two (2) times a day. Use for up to 12 weeks 30 g 3   ??? ibuprofen (MOTRIN) 800 MG tablet Take 800 mg by mouth every six (6) hours as needed.      ??? ipratropium-albuteroL (DUO-NEB) 0.5-2.5 mg/3 mL nebulizer USE 1 VIAL VIA NEBULIZER EVERY 4 TO 6 HOURS AS NEEDED FOR COUGH OR WHEEZING     ??? oxyCODONE (ROXICODONE) 5 MG immediate release tablet Take 1 tablet (5 mg total) by mouth every eight (8) hours as needed for pain for up to 10 doses. 10 tablet 0   ??? pantoprazole (PROTONIX) 20 MG tablet Take 40 mg by mouth in the morning.     ??? pregabalin (LYRICA) 50 MG capsule Take 1 capsule (50 mg total) by mouth Three (3) times a day. 90 capsule 2   ??? PREMARIN 0.625 mg/gram vaginal cream      ??? traZODone (DESYREL) 50 MG tablet Take 50-100 mg by mouth nightly.     ??? URO-MP 118-10-40.8-36 mg cap TAKE 1 CAPSULE BY MOUTH FOUR TIMES DAILY AS NEEDED       No current facility-administered medications for this visit.       Allergies:  Allergies   Allergen Reactions   ??? Buprenorphine Hcl Hives     Unknown-pt denies   ??? Codeine Rash, Hives and Itching   ??? Adhesive Rash   ??? Iodinated Contrast Media Rash   ??? Latex Rash   ??? Morphine Itching   ??? Opioids - Morphine Analogues Itching     Unknown-pt denies         Family History:   Family History   Problem Relation Age of Onset   ??? Hypertension Mother    ??? Diabetes Mother    ??? Hypertension Father    ???  Cancer Father         Leukemia   ??? Cancer Sister         breast    The patient reports no family history for bleeding disorders or anesthetic problems.    Social History:  Social History     Socioeconomic History   ??? Marital status: Single   Tobacco Use   ??? Smoking status: Former     Packs/day: 0.50     Years: 30.00     Pack years: 15.00     Types: Cigarettes   ??? Smokeless tobacco: Never   ??? Tobacco comments:     started smoking age as a teen   Substance and Sexual Activity   ??? Alcohol use: No   ??? Drug use: No   Other Topics Concern   ??? Do you use sunscreen? No   ??? Tanning bed use? No   ??? Are you easily burned? No   ??? Excessive sun exposure? No   ??? Blistering sunburns? No       ROS:   Otherwise, 12 point review of systems was completed and is negative except as per HPI.      Vitals:   Vitals:    08/06/21 0806   BP: 154/86   Pulse: 79   Resp: 18   Temp: 36.1 ??C (97 ??F)   SpO2: 99%     ?  Physical Exam:   Gen: AA in NAD  HEENT: NCAT, EOMI, PERRL, Non icteric sclerae, MMM  CV: RRR  RESP: quiet respirations on room air  EXT: Left thigh almost fully re-epithelialized with a 3 mm wide, 6 length area remaining to epithelialize  BUTTOCKS: Buttocks completely healed.   Breasts: bilateral breasts with grade 3 ptosis.  Stigmata of HS lesions scattered about the breasts, mostly in the inferior aspect of both breasts.  No active drainage from the lesions at this time.    Impression and Plan:  Elizabeth Browning is here today for post op check after excision of??buttock/sacral hidradenitis measuring 12 cm x 8 cm; left thigh hidradenitis excision measuring 15x 10 cm on 04/06/21. Doing well.     - RTC in 3 M  - Will submit to insurance for resection of the breast lesions with a reduction mammaplasty

## 2021-08-08 ENCOUNTER — Ambulatory Visit
Admit: 2021-08-08 | Discharge: 2021-08-09 | Payer: MEDICAID | Attending: Student in an Organized Health Care Education/Training Program | Primary: Student in an Organized Health Care Education/Training Program

## 2021-08-08 NOTE — Unmapped (Signed)
I saw and evaluated the patient, participating in the key elements of the service. I discussed the findings, assessment and plan with the resident and agree with resident??s findings and plan as documented in the note. I was present for the entirety of the procedure(s).    Elizabeth Chandler, MD  SPORTS MEDICINE RETURN VISIT    ASSESSMENT AND PLAN     Problem List Items Addressed This Visit    None  Visit Diagnoses     Chronic left hip pain    -  Primary    Relevant Orders    Lg Joint Inj: L hip joint    Degenerative disc disease, lumbar              Left hip pain - Chronic, specific etiology unclear, with diagnostic/therapeutic intra-articular injection reasonable to attempt for both purposes.  MRI pending, but low suspicion for bony stress injury at this time.  I would not be surprised if the majority of her pain originates external to the hip, which may need to be addressed if she does not adequately respond to intra-articular injection.  --Imaging findings, further diagnostic and therapeutic options were reviewed with the patient, along with the benefits and drawbacks of each, and the patient expressed understanding  --Patient consented for ultrasound-guided intra-articular glucocorticoid injection to the left hip joint as documented below  --Could consider separate injection to the gluteus medius tendon insertion if symptoms do not adequately improve and lateral hip pain persists, but she really does not tolerate injections particularly well  --MRI and follow-up with Spine as already scheduled    Return if symptoms worsen or fail to improve.     Elizabeth Song, MD   Primary Care Sports Medicine Fellow   [facility]   Department of Family Medicine  08/08/2021    Procedure(s):  Consent  After discussing the various treatment options for the condition, it was agreed that glucocorticoid injection would be the next step in treatment. The nature of and the indications for injection of glucocorticoid and local anaesthetic were reviewed in detail with the patient today. The inherent risks of injection, including infection, bleeding, allergic reaction, increased pain, incomplete relief or temporary relief of symptoms, alterations of blood glucose levels requiring careful monitoring and treatment as indicated, tendon, ligament or articular cartilage rupture or degeneration, nerve injury, skin depigmentation, and/or fatty atrophy were discussed as relevant. The patient expressed understanding and consented to proceed.  Procedure   After the risks and benefits of the procedure were explained, verbal consent was given, and a procedural time-out was performed.  Anatomic landmarks were palpated to identify the approach. The relevant anatomy was then visualized with ultrasound.  The site for the injection was properly marked and prepped with chlorhexidine solution.   The injection site was anesthetized with ethyl chloride and 1% lidocaine.   Using ultrasound guidance, the left hip joint was visualized and injected with 20 milligrams of Kenalog (triamcinolone acetonide) and 4 milliliters of 0.5% ropivacaine using a sterile technique.   During the injection, there was unrestricted flow and care was taken not to inject glucocorticoid into the skin or subcutaneous tissues.  There were no complications during the procedure.  Post-procedure  A sterile adhesive bandage was applied.   Instructions were given regarding post-injection care, when to follow up in clinic and what to expect from the procedure.   The patient tolerated the procedure well and was discharged without immediate complication.    NEED FOR SONOGRAPHIC  GUIDANCE  Given the complexity of this problem and the anatomic location of this structure, sonographic guidance is recommended to prevent injury to neurovascular structures and confirm accuracy of injection. The accuracy of doing these injections blind is poor and the benefit to the patient by using ultrasound guidance is significant to avoid complications.     Reference:  American Medical Society for Sports Medicine (AMSSM) position statement: interventional musculoskeletal ultrasound in sports medicine.  Morey Hummingbird MM, Adams E, Berkoff D, Concoff AL, Jiles Crocker J Sports Med. 2014 Oct 20. pii: [id number]. doi: 10.1136/[id number]      SUBJECTIVE     Chief Complaint:   Chief Complaint   Patient presents with   ??? Left Hip - Injection(s), Pain     L Hip - USGI        History of Present Illness: 56 y.o. female who presents for left hip pain.  She has a complex past medical history significant for hidradenitis suppurativa, schizoaffective disorder with depressive subtype, substance use disorder (cocaine) and left total knee arthroplasty status post multiple revisions among other issues.  Regarding her hip pain, symptoms have been present for over 6-12 months months now, developing in the absence of specific inciting trauma.  Pain localizes primarily to the posterior hip with some occasional radiation to the groin but more often distally down the lower extremity. Patient also has been endorsing lateral hip pain and as a result, she has to sleep on her right side.   Symptoms are exacerbated by weightbearing and she ambulates with a walker.  She has not found relief from over-the-counter or prescription medications.  Radiographs have been obtained demonstrating relatively little degenerative change.  She has an MRI scheduled for next month (08/20/2021).  Intra-articular injection has been recommended, and the patient is interested in pursuing this in hopes of achieving some pain relief.  She denies fevers or chills.    Past Medical History:   Past Medical History:   Diagnosis Date   ??? Anesthesia to pain     difficulty relaxing and letting anesthesia work- anxiety noted   ??? Anxiety    ??? Arthritis    ??? Asthma    ??? Cancer (CMS-HCC)     cervical   ??? Chronic bronchitis (CMS-HCC) ??? Chronic hepatitis C without hepatic coma (CMS-HCC)     cleared as of 10/25/16 per patient from [facility]   ??? Colon polyp    ??? Difficult intravenous access     requesting site to be numbed   ??? DOE (dyspnea on exertion)    ??? Dysthymia (RAF-HCC)    ??? Fatigue    ??? Foot pain    ??? GERD (gastroesophageal reflux disease) (RAF-HCC)    ??? Hepatitis C    ??? Loosening of knee joint prosthesis (CMS-HCC)     left   ??? Migraine    ??? MRSA (methicillin resistant staph aureus) culture positive     no + culture found- pt denies-current culture negative   ??? Neuropathic pain    ??? URI (upper respiratory infection)    ??? Vitamin D deficiency          OBJECTIVE     Physical Exam:  Vitals:   Wt Readings from Last 3 Encounters:   08/06/21 (!) 107 kg (236 lb)   07/18/21 (!) 107.9 kg (237 lb 14.4 oz)   07/02/21 (!) 110.6 kg (243 lb 14.4 oz)     Estimated body mass index is 35.36  kg/m?? as calculated from the following:    Height as of 08/06/21: 174 cm (5' 8.5).    Weight as of 08/06/21: 107 kg (236 lb).  Gen: Well-appearing female in no acute distress  MSK: LEFT HIP -  TTP over the greater trochanter and anterior hip.  In general, pain seems out of proportion to objective findings.  40 degrees of flexion, 20 degrees of internal rotation and 40 degrees of external rotation.  Range of motion is painful but restricted 2/2 pain.  4 /5 strength in adduction, abduction and hip flexion both initiated with hip extended and initiated with hip flexed due to pain.  Negative Ober, Positive FABER, positive FADIR, negative labral provocative testing, Positive clamshell. Patient couldn't adequately perform a straight leg raise due to quad atrophy.   Neurovascularly intact.    Imaging/other tests: X-rays of the left hip 07/18/2021 demonstrate evidence of mild degenerative change with relatively well-preserved joint spaces and no high-grade osseous abnormalities.  No periosteal reaction or bony lucency to suggest stress injury.      ADMINISTRATIVE     I have personally reviewed and interpreted the images (as available).  I have personally reviewed prior records and incorporated relevant information above (as available).    MEDICAL DECISION MAKING (level of service defined by 2/3 elements)     Number/Complexity of Problems Addressed 1 undiagnosed new problem with uncertain prognosis (99204/99214)   Amount/Complexity of Data to be Reviewed/Analyzed Meets at least 2 of the 3 MODERATE criteria above (99205/99215)   Risk of Complications/Morbidity/Mortality of Management Decision for MINOR Surgery (Including Injection) WITHOUT Risk Factors (99203/99213)     TIME     Total Time for E/M Services on the Date of Encounter Time-based coding not utilized for this encounter     CONSULTATION     Consultation services provided No     MODIFIER -25     Significant, Separately Billable Evaluation and Management YES - The patient's condition required a significant, separately identifiable, medical necessary evaluation and management service by the provider in addition to the procedure performed on the same date of service. The evaluation and management service was above and beyond the usual preoperative and postoperative care associated with the procedure.       PROCEDURES     Lg Joint Inj: L hip joint on 08/08/2021 1:10 PM  Details: ultrasound-guided  Medications: 20 mg triamcinolone acetonide 10 mg/mL    Medical Care Team Attestation: All ProcDoc orders were read back and verbally confirmed with the procedure provider, including but not limited to patient name, medication name, dose, and route, before any actions were taken.  Provider Attestation: The information documented by members of my medical care team was reviewed and verified for accuracy by me.             DME     DME ORDER:  Dx:  ,

## 2021-08-08 NOTE — Unmapped (Signed)
You received a corticosteroid injection to reduce pain and inflammation.  Please note that it can take up to 2 weeks for this injection to fully work.  While many people will feel relief sooner, please be patient.    The injection contained a corticosteroid and a numbing agent.  The numbing agent can last for 1-6 hours.  After this wears off you may have increased pain until the steroid has a chance to work.    Unless you have been directed otherwise, you may take over-the-counter pain medications and apply ice to the affected area as needed. If you have prescription medications for treatment of your condition, you may take these according to the prescription instructions.    You can resume your normal daily activities, but consider resting the injected area for the next few days.      What are some of the possible side effects of a steroid injection?    Common side effects:  temporarily elevated blood sugar (in diabetic patients) that can last a few days   flushing of the skin, especially the face  temporary rise in blood pressure  discoloration or atrophy of the skin at the injection site    Call your doctor at once if you have:  persistent worsening pain or swelling, fever;  blurred vision, tunnel vision, eye pain, or seeing halos around lights;  fast or slow heartbeats;  increased blood pressure that is associated with severe headache, blurred vision, pounding in your neck or ears, anxiety, nosebleed;  headaches, ringing in your ears, dizziness, nausea, vision problems, pain behind your eyes    This is not a complete list of side effects and others may occur. Call your doctor for medical advice about side effects.     What other drugs may be affected after the injection?  Many drugs can interact with steroids. Not all possible interactions are listed here. Tell your doctor about all your current medicines and any you start or stop using, especially:  an antibiotic or antifungal medication;  birth control pills or hormone replacement therapy;  a blood thinner (warfarin, Coumadin, and others);  a diuretic or water pill;  insulin or oral diabetes medicine;  medicine to treat tuberculosis;  a nonsteroidal anti-inflammatory drug or NSAID (aspirin, ibuprofen, naproxen, diclofenac, indomethacin, Advil, Aleve, Celebrex, and many others); or  seizure medication.      You can reach our office through My [facility] Chart ([email or web address]) or by phone at [phone].    We will see you back as scheduled. If you need to schedule another appointment, please call [phone].    Thank you for coming to [facility] and the [facility], where we strive to provide innovative and comprehensive patient-centered care based on research evidence. We appreciate you choosing [facility] for your care, and we look forward to seeing you again for any future needs!      Sincerely,    Sabino Dick, MD

## 2021-08-10 NOTE — Unmapped (Signed)
[facility] Refill Coordination Note    Specialty Medication(s) to be Shipped:   Inflammatory Disorders: Simponi    Other medication(s) to be shipped: No additional medications requested for fill at this time     Elizabeth Browning, DOB: 1965-12-27  Phone: [phone] (home)       All above HIPAA information was verified with patient.     Was a Nurse, learning disability used for this call? No    Completed refill call assessment today to schedule patient's medication shipment from the [facility] [phone]).  All relevant notes have been reviewed.     Specialty medication(s) and dose(s) confirmed: Regimen is correct and unchanged.   Changes to medications: Elizabeth Browning reports no changes at this time.  Changes to insurance: No  New side effects reported not previously addressed with a pharmacist or physician: None reported  Questions for the pharmacist: No    Confirmed patient received a Conservation officer, historic buildings and a Surveyor, mining with first shipment. The patient will receive a drug information handout for each medication shipped and additional FDA Medication Guides as required.       DISEASE/MEDICATION-SPECIFIC INFORMATION        For patients on injectable medications: Patient currently has 0 doses left.  Next injection is scheduled for 08/22/2021.    SPECIALTY MEDICATION ADHERENCE     Medication Adherence    Patient reported X missed doses in the last month: 0  Specialty Medication: Simponi  Patient is on additional specialty medications: No        Were doses missed due to medication being on hold? No    REFERRAL TO PHARMACIST     Referral to the pharmacist: Not needed      [facility]     Shipping address confirmed in Epic.     Delivery Scheduled: Yes, Expected medication delivery date: 08/17/2021.     Medication will be delivered via UPS to the prescription address in Epic WAM.    Lorelei Pont [facility] Specialty Technician

## 2021-08-15 DIAGNOSIS — L732 Hidradenitis suppurativa: Principal | ICD-10-CM

## 2021-08-15 MED ORDER — PREGABALIN 50 MG CAPSULE
ORAL_CAPSULE | Freq: Three times a day (TID) | ORAL | 2 refills | 30.00000 days
Start: 2021-08-15 — End: 2021-11-13

## 2021-08-15 NOTE — Unmapped (Unsigned)
Department of Anesthesiology  [facility]  [address], [address]  [place], [place] [address]  [phone]    Chronic Pain Follow Up Note    1. Hidradenitis suppurativa        Assessment and Plan  Attending: Oda Kilts  Elizabeth Browning is a 56 y.o. female with a PMHx of asthma, GERD, migraine, obesity, hep C, hx of cervical ca. She is being seen at the [facility] as referral for chronic buttock and sacral pain secondary to hidradenitis suppurativa s/p multiple surgeries last on 04/06/21 . She also expresses concerns regarding left hip pain.    February 2023  The patient was first seen in December 2022, at which point she reported sacral pain which she associated with her history of hidradenitis suppurativa s/p multiple excisions as well as pain in her left hip. In terms of her sacral pain, patient reported that she primarily experienced discomfort around the area surrounding her incision site. She endorsed past benefit with Percocet, Morphine, and Lyrica. We agreed to restart Lyrica given lack of reported side effects with this. In terms of her hip pain, patient reported that her discomfort began about 6 months ago without any inciting events. She denied any radiation of her pain into the groin but endorsed difficulty with ambulation and certain movements. We decided to order a left hip XR to further evaluate the nature of patient's pain and counseled her on safe NSAID and tylenol usage. Today, ***      Patient {DOES /DOES ZHY:86578} appear to be utilizing pain medications appropriately and {DOES /DOES ION:62952} report that the medications do improve patient's quality of life and functionality level.  At today's visit, the patient reports {ajlmedresponse:47259} analgesia from their current medication regimen {with/without:43712} significant adverse effects.    Last urine toxicology: N/A.  Last reviewed: 08/15/21. It is not due today.  Last urine toxicology screen N/A appropriate.   UDS Frequency: N/A  Last opioid agreement: N/A.  Update is not due today.  Patient brought medications to the clinic visit?  N/A  Medication supply appropriate?  N/A  Opioid Risk Tool:N/A.   Pain Psychology: N/A  Last Opioid Change: N/A  Last EKG: N/A.  Repeat is not due today.  Previous Compliance Issues: None  [place] DOC (from new visit): None  Naloxone ordered: N/A  Total morphine equivalents: N/A  Goals of opioids: N/A  Reason for >90mg  MME: N/A  Benzodiazepine: N/A  [place] database was reviewed 08/15/21.      PLAN:  - Start pregabalin (LYRICA) 50 MG capsule; Take 1 capsule (50 mg total) by mouth Three (3) times a day.  Dispense: 90 capsule; Refill: 2  - Counseled on safe NSAID and tylenol usage for which the patient expressed understanding  - Lyrica as above  - Order left hip XR  - Follow with Orthopedics on 07/18/21  - Follow in 6-8 weeks  - XR Hip 2 Views Left; Future    Future Considerations:  - lidocaine gel over healed excision   - Titration of Lyrica  - Topamax    -No follow-ups on file.    Requested Prescriptions     Pending Prescriptions Disp Refills   ??? pregabalin (LYRICA) 50 MG capsule 90 capsule 2     Sig: Take 1 capsule (50 mg total) by mouth Three (3) times a day.     No orders of the defined types were placed in this encounter.      Risks and benefits  of above medications including but not limited to possibility of respiratory depression, sedation, and even death were discussed with the patient who expressed an understanding.     Patient was not advised to present to the next visit with medication bottles available for a pill/patch count.    HPI  Elizabeth Browning is a 56 y.o. being followed at [facility] for complaint of chronic sacral and left hip pain.      We first saw patient 06/25/21. Since that time, she has continued to follow with Plastic Surgery and Orthopedics. She also underwent left hip XR at the beginning of January (see imaging).    Today, ***    Patients current medication regimen:  OTC Aleve  Lyrica 50 mg TID        No questionnaires available.                     The patient states her pain is located *** and the severity of her pain ranges from ***/10 to ***/10.  Her pain currently is ***/10 and on average is ***/10.  She describes the sensation of her pain as {pain described:58928}. Her pain is present {pain frequency:58931} and worst {pain worst:59685}. The patient???s pain impacts {negatively affected:59709}. Her interval history includes {interval history:59710}. Her pain {pain changed:59711}, and she {does does not blank:47747} have new pain to discuss today. She {is/is not:36772} on blood thinners or anti-coagulants. In regards to medications currently taken for pain management, the patient {is:54246::is} tolerating these medications well and complains of associated side effects: {pain med side effects:59712}.    Patient {Denies/complains:31533} homicidal/suicidal ideation.     Previous Medication Trials:  Lyrica, oxycodone, trazodone, ibuprofen     Previous Interventions  Lumbar MBB's. No record per chart review, was done in a clinic in [place].       Allergies  Allergies   Allergen Reactions   ??? Buprenorphine Hcl Hives     Unknown-pt denies   ??? Codeine Rash, Hives and Itching   ??? Adhesive Rash   ??? Iodinated Contrast Media Rash   ??? Latex Rash   ??? Morphine Itching   ??? Opioids - Morphine Analogues Itching     Unknown-pt denies         Home Medications    Current Outpatient Medications   Medication Sig Dispense Refill   ??? ADVAIR DISKUS 250-50 mcg/dose diskus INHALE 1 PUFF BY MOUTH TWICE DAILY 12 HOURS APART     ??? albuterol HFA 90 mcg/actuation inhaler Inhale 2 puffs every four (4) hours as needed.     ??? atorvastatin (LIPITOR) 40 MG tablet Take 1 tablet by mouth in the morning.     ??? cefdinir (OMNICEF) 300 MG capsule Take 1 capsule (300 mg total) by mouth Two (2) times a day. 60 capsule 0   ??? DEXILANT 60 mg capsule      ??? empty container Misc use as directed 1 each 2   ??? empty container Misc Use as directed 1 each 2   ??? golimumab (SIMPONI) 100 mg/mL PnIj Inject the contents of 2 pens (200mg ) under the skin on week 0 and week 2 as loading doses. 4 mL 0   ??? golimumab 100 mg/mL PnIj Inject the contents of 2 pens (200mg ) under the skin every 4 weeks as maintenance. 2 mL 11   ??? hydroquinone 4 % cream Apply topically Two (2) times a day. Use for up to 12 weeks 30 g 3   ???  ibuprofen (MOTRIN) 800 MG tablet Take 800 mg by mouth every six (6) hours as needed.      ??? ipratropium-albuteroL (DUO-NEB) 0.5-2.5 mg/3 mL nebulizer USE 1 VIAL VIA NEBULIZER EVERY 4 TO 6 HOURS AS NEEDED FOR COUGH OR WHEEZING     ??? oxyCODONE (ROXICODONE) 5 MG immediate release tablet Take 1 tablet (5 mg total) by mouth every eight (8) hours as needed for pain for up to 10 doses. 10 tablet 0   ??? pantoprazole (PROTONIX) 20 MG tablet Take 40 mg by mouth in the morning.     ??? pregabalin (LYRICA) 50 MG capsule Take 1 capsule (50 mg total) by mouth Three (3) times a day. 90 capsule 2   ??? PREMARIN 0.625 mg/gram vaginal cream      ??? traZODone (DESYREL) 50 MG tablet Take 50-100 mg by mouth nightly.     ??? URO-MP 118-10-40.8-36 mg cap TAKE 1 CAPSULE BY MOUTH FOUR TIMES DAILY AS NEEDED       No current facility-administered medications for this visit.     Imaging/Tests:   ??  Knee XRAY 12/30/16-  Standing AP of both knees and lateral of the left knee show a total knee replacement the implants appeared be well fixed. Special attention was paid to the femoral and tibial components that which have not change in alignment is no signs of ostial lysis or loosening of the implants certainly no fracture dislocation subluxation or tumor seen     XR Left Hip 07/18/21  FINDINGS AND   IMPRESSION:  ??  Suboptimal positioning on the AP view of the left hip limits evaluation.  ??  Within the limitations, no fracture or malalignment. Lucencies in the left femoral head are favored to represent degenerative subcortical cysts.  ??  Mild osteoarthritis of both hips. Mild periarticular sclerosis at the bilateral SI joints, likely degenerative. Advanced lower lumbar degenerative changes.    ROS  General {ajlrosgen:46920}  Cardiovascular {aggROSCV:37303}  Gastrointestinal {aggROSGI:37304}  Skin {aggROSskin:37305}  Endocrine {aggROSendo:37306}  Musculoskeletal {aggROSmusk:37307}  Neurologic {aggROSneu:37308}  Psychiatric {aggROSpsych:37309}      Physical Exam    VITALS: There were no vitals filed for this visit.  Wt Readings from Last 3 Encounters:   08/06/21 (!) 107 kg (236 lb)   07/18/21 (!) 107.9 kg (237 lb 14.4 oz)   07/02/21 (!) 110.6 kg (243 lb 14.4 oz)       GENERAL:  The patient is well developed, well-nourished and appears to be in no apparent distress. The patient is pleasant and interactive. Patient is a good historian.  HEENT:    Reveals normocephalic/atraumatic. Clear sclera. Mucous membranes are moist.  RESPIRATORY:   Normal work of breathing.  No supplemental oxygen.  GASTROINTESTINAL:   Soft, non-distended  NEUROLOGIC:    The patient was alert and oriented, speech fluent, normal language.   MUSCULOSKELETAL:    Motor function preserved in upper and lower extremities with normal tone and bulk. Good range of motion of extremities. The patient was able to ambulate without difficulty throughout the clinic today {With/without:5700} the assistance of a walking aid.    SKIN:   No obvious rashes, lesions, or erythema.  PSYCHIATRIC:  Appropriate mood and affect.  No pressured speech.      Urine toxiciology screen  No results found for: AMPHU, BARBU, BENZU, CANNAU, METHU, OPIAU, COCAU    OPIOID CONFIRMATION:  No results found for: CDIFFTOX, NBUPR, LABCO, HYDROCODONE, HYDROMORPH, MORPHINE, OXYCODONE, OXMU, MAMU, OPIU     BENZODIAZEPINE CONFIRMATION:  No results found for: CDIFFTOX, CDIFFTOX, OHALP, CLONU, HDXYFLRAZUR, 7NHFLU, DESALKYCONF, LORAZURQT, HDXYTRIAZUR, MIDAZURQT, BNZU

## 2021-08-16 ENCOUNTER — Ambulatory Visit: Admit: 2021-08-16 | Payer: MEDICAID

## 2021-08-16 MED FILL — SIMPONI 100 MG/ML SUBCUTANEOUS PEN INJECTOR: 28 days supply | Qty: 2 | Fill #10

## 2021-08-20 ENCOUNTER — Ambulatory Visit: Admit: 2021-08-20 | Discharge: 2021-08-21 | Payer: MEDICAID

## 2021-08-22 ENCOUNTER — Ambulatory Visit: Admit: 2021-08-22 | Discharge: 2021-08-23 | Payer: MEDICAID

## 2021-08-22 LAB — CBC W/ AUTO DIFF
BASOPHILS ABSOLUTE COUNT: 0.1 10*9/L (ref 0.0–0.1)
BASOPHILS RELATIVE PERCENT: 0.9 %
EOSINOPHILS ABSOLUTE COUNT: 0.2 10*9/L (ref 0.0–0.5)
EOSINOPHILS RELATIVE PERCENT: 1.7 %
HEMATOCRIT: 38.8 % (ref 34.0–44.0)
HEMOGLOBIN: 12.6 g/dL (ref 11.3–14.9)
LYMPHOCYTES ABSOLUTE COUNT: 4.4 10*9/L — ABNORMAL HIGH (ref 1.1–3.6)
LYMPHOCYTES RELATIVE PERCENT: 41.2 %
MEAN CORPUSCULAR HEMOGLOBIN CONC: 32.5 g/dL (ref 32.0–36.0)
MEAN CORPUSCULAR HEMOGLOBIN: 27.6 pg (ref 25.9–32.4)
MEAN CORPUSCULAR VOLUME: 84.9 fL (ref 77.6–95.7)
MEAN PLATELET VOLUME: 7.8 fL (ref 6.8–10.7)
MONOCYTES ABSOLUTE COUNT: 0.7 10*9/L (ref 0.3–0.8)
MONOCYTES RELATIVE PERCENT: 6.4 %
NEUTROPHILS ABSOLUTE COUNT: 5.3 10*9/L (ref 1.8–7.8)
NEUTROPHILS RELATIVE PERCENT: 49.8 %
NUCLEATED RED BLOOD CELLS: 0 /100{WBCs} (ref <=4)
PLATELET COUNT: 412 10*9/L (ref 150–450)
RED BLOOD CELL COUNT: 4.57 10*12/L (ref 3.95–5.13)
RED CELL DISTRIBUTION WIDTH: 12.7 % (ref 12.2–15.2)
WBC ADJUSTED: 10.6 10*9/L (ref 3.6–11.2)

## 2021-08-22 LAB — CREATININE
CREATININE: 0.97 mg/dL — ABNORMAL HIGH
EGFR CKD-EPI (2021) FEMALE: 69 mL/min/{1.73_m2} (ref >=60)

## 2021-08-22 LAB — C-REACTIVE PROTEIN: C-REACTIVE PROTEIN: 26 mg/L — ABNORMAL HIGH (ref <=10.0)

## 2021-08-22 LAB — ALT: ALT (SGPT): 12 U/L (ref 10–49)

## 2021-08-22 LAB — AST: AST (SGOT): 25 U/L (ref <=34)

## 2021-08-22 LAB — SEDIMENTATION RATE: ERYTHROCYTE SEDIMENTATION RATE: 77 mm/h — ABNORMAL HIGH (ref 0–30)

## 2021-08-22 MED ADMIN — acetaminophen (TYLENOL) tablet 650 mg: 650 mg | ORAL | @ 19:00:00 | Stop: 2021-08-22

## 2021-08-22 MED ADMIN — methylPREDNISolone sodium succinate (PF) (Solu-MEDROL) injection 20 mg: 20 mg | INTRAVENOUS | @ 19:00:00 | Stop: 2021-08-22

## 2021-08-22 MED ADMIN — inFLIXimab-axxq (AVSOLA) 5 mg/kg = 500 mg in sodium chloride (NS) 250 mL IVPB: 5 mg/kg | INTRAVENOUS | @ 20:00:00 | Stop: 2021-08-22

## 2021-08-22 MED ADMIN — cetirizine (ZyrTEC) tablet 10 mg: 10 mg | ORAL | @ 19:00:00 | Stop: 2021-08-22

## 2021-08-22 NOTE — Unmapped (Signed)
Pt presents for Avsola infusion.  Pt denies any recent infection, VSS.  IV placed, labs drawn, premeds administered.  Pt aware of potential reaction/side effects, call bell within reach.Pt requested RN call MD for pain med- Inst pt we have no pain meds in this clinic. Pt c/o of wait time - appointment at 1445- brought pt back at 1147. Requested manger come talk to patient. Pt requested RN to contact MD for FU appointment- in basket message sent. Pt requested to increase rate of infusion, because her ride was ready to leave. Inst can not be done, will possible create reaction, and lengthen her stay.    1430 Avsola 500 mg started, to infuse at the following rates:  41ml/hr for 15 min  31ml/hr for 15 min  33ml/hr for 15 min  68ml/hr for 15 min  133ml/hr for 30 min  268ml/hr until complete    1630 Infusion complete.  Pt tolerated without complication, VSS.  IV flushed per policy and d/c'd, gauze and coban applied.  Pt left clinic in no acute distress.

## 2021-08-24 NOTE — Unmapped (Signed)
Addended by: Nolon Bussing D on: 08/24/2021 11:55 AM     Modules accepted: Level of Service

## 2021-08-31 DIAGNOSIS — L732 Hidradenitis suppurativa: Principal | ICD-10-CM

## 2021-08-31 MED ORDER — AMOXICILLIN 875 MG-POTASSIUM CLAVULANATE 125 MG TABLET
ORAL_TABLET | Freq: Two times a day (BID) | ORAL | 0 refills | 14.00000 days | Status: CP
Start: 2021-08-31 — End: 2021-09-14

## 2021-08-31 NOTE — Unmapped (Signed)
Daughter of patient, Cherie Ouch, called nurse line and asked for doctor to call her at [phone] to discuss treatment for her huge abscess under right breast.

## 2021-08-31 NOTE — Unmapped (Signed)
Patient called nurse line and LM asking for an antibiotic for a huge boil under her right breast.  Has f/u appt 09/18/2021.

## 2021-09-01 NOTE — Unmapped (Signed)
Spoke with patient's daughter.  Augmentin sent into pharmacy for flaring.

## 2021-09-04 ENCOUNTER — Ambulatory Visit: Admit: 2021-09-04 | Discharge: 2021-09-04 | Payer: MEDICAID

## 2021-09-04 DIAGNOSIS — M5442 Lumbago with sciatica, left side: Principal | ICD-10-CM

## 2021-09-04 DIAGNOSIS — M5416 Radiculopathy, lumbar region: Principal | ICD-10-CM

## 2021-09-04 DIAGNOSIS — M5136 Other intervertebral disc degeneration, lumbar region: Principal | ICD-10-CM

## 2021-09-04 DIAGNOSIS — G8929 Other chronic pain: Principal | ICD-10-CM

## 2021-09-04 MED ORDER — TIZANIDINE 4 MG TABLET
ORAL_TABLET | Freq: Three times a day (TID) | ORAL | 1 refills | 10 days | Status: CP | PRN
Start: 2021-09-04 — End: ?

## 2021-09-04 NOTE — Unmapped (Signed)
Medications: Take over-the-counter medications as needed and as tolerated  If you don't have liver disease, the safest medication for pain is acetaminophen Extended Release (Tylenol Arthritis).  Take 650mg  (1 extended release tab) at a time for pain relief every 8 hours.  Do not take more than 3000mg  per day.  Take medications as precribed by your Medical Team  We have prescribed a muscle relaxer, tizanidine 4 mg.  Take as needed as prescribed.  Activity: Activities as tolerated.  Conservative Care: Home Exercise Program provided today.  Surgical Care: No surgical indications at this time.  Imaging studies: MRI of the Lumbosacral spine was ordered. Medical necessity: Radiographs have been performed/ordered. Conservative treatments are inappropriate due to clinical findings noted on our evaluation.  Lumbar AP, lateral, and Ferguson views (today on the way out).  Labs: None  Return for Next scheduled follow up after MRI.     Contact our nursing team via MyChart or [phone] with any clinical questions/concerns.  Our scheduling team can be reached at [phone].

## 2021-09-04 NOTE — Unmapped (Signed)
ORTHOPAEDIC SPINE CLINIC NOTE       Bertram Millard. Lorin Picket, DNP  Nurse Practitioner  [email or web address]  [phone]        Patient Name:Elizabeth Browning  MRN: [id number]  DOB: 14-Mar-1966    Date: 09/04/2021    PCP: Lilian Kapur, PA    ASSESSMENT:     1. Left lumbar radiculitis    2. Degenerative disc disease, lumbar    3. Chronic left-sided low back pain with left-sided sciatica        Elizabeth Browning is a 56 y.o. female with chronic low back pain with left lumbar radiculitis (to foot) since 04/2021.  Known left hip labral tear with no relief from recent hip injection.  Physical exam consistent with a left lumbar radiculopathy.    PROM:  None     PLAN and RECOMMENDATIONS:     Options reviewed with patient.  Patient feels like she is in too much pain to be able to participate in physical therapy.  Will prescribe tizanidine for symptom management.  We will obtain a lumbar MRI to further evaluate.    Future Considerations:  -ESI    Patient Instructions ??   Medications: Take over-the-counter medications as needed and as tolerated  ?? If you don't have liver disease, the safest medication for pain is acetaminophen Extended Release (Tylenol Arthritis).  Take 650mg  (1 extended release tab) at a time for pain relief every 8 hours.  Do not take more than 3000mg  per day.  ?? Take medications as precribed by your Medical Team  ?? We have prescribed a muscle relaxer, tizanidine 4 mg.  Take as needed as prescribed.  ?? Activity: Activities as tolerated.  ?? Conservative Care: Home Exercise Program provided today.  ?? Surgical Care: No surgical indications at this time.  ?? Imaging studies: MRI of the Lumbosacral spine was ordered. Medical necessity: Radiographs have been performed/ordered. Conservative treatments are inappropriate due to clinical findings noted on our evaluation.  ?? Lumbar AP, lateral, and Ferguson views (today on the way out).  ?? Labs: None  ?? Return for Next scheduled follow up after MRI.     Contact our nursing team via MyChart or [phone] with any clinical questions/concerns.  Our scheduling team can be reached at [phone].       Orders Placed This Encounter   Procedures   ??? XR Lumbar Spine AP, Lateral, Ferguson   ??? MRI Lumbar Spine Wo Contrast     Medications Prescribed Today             tiZANidine (ZANAFLEX) 4 MG tablet Take 1 tablet (4 mg total) by mouth every eight (8) hours as needed.           SUBJECTIVE:     Chief Complaint:  Chief Complaint   Patient presents with   ??? left hip       History of Present Illness:   Elizabeth Browning is a 56 y.o. female with a relevant PMH of Anxiety, cancer, chronic hepatitis C, GERD, vitamin D deficiency seen in consultation at the request of Peyton Najjar, PA for evaluation of low back pain, left leg pain.    Patient was evaluated by Avie Arenas, PA-C 07/18/2021 for left hip and left groin pain with radiating pain down her left leg to her foot.  Symptoms been ongoing since approximately 04/2021.  Left hip injection 08/08/2021 with Dr. Clydene Pugh with no effect.    Left hip x-ray 07/2021: Mild bilateral hip OA.  MRI of left hip 08/2021 probable labral tear.  She also had a history of left TKA and 2 subsequent revisions, and excision of buttock/sacral hidradenitis on 04/06/21.     Has been using a rollator for ambulation since 2019 after knee replacement.         09/04/21 1308   PainSc: 10-Worst pain ever   Date of onset: since 04/2021  Location: low back, LLE (to foot)  Frequency: Constant  Modifying factors: worse with standing, worse with walking, worse with lying down  Associated symptoms: numbness, weakness and tingling    Rest relief?:no  Positional relief?: no     Current Treatments Previous Treatments   Current Relevant Pain Medications:  ?? NSAID: Ibuprofen 800 mg  ?? AED: Lyrica 50 mg 3 times daily    Current Physical Therapy: No recent PT    Adjunct Treatments: Activity Modification    In a Pain Clinic: No Prior Relevant Pain 50 mg 3 times daily    Current Physical Therapy: No recent PT    Adjunct Treatments: Activity Modification    In a Pain Clinic: No Prior Relevant Pain Medications:  ???   Prior Physical Therapy: None.    Injections:Yes.   - Left hip injection 08/08/2021  - Lumbar ESIs 2008-2009     Prior Relevant Surgeries: Yes.   - Left TKA w/ 2 revision surgeries        Medical History   She  has a past medical history of Anesthesia to pain, Anxiety, Arthritis, Asthma, Cancer (CMS-HCC), Chronic bronchitis (CMS-HCC), Chronic hepatitis C without hepatic coma (CMS-HCC), Colon polyp, Difficult intravenous access, DOE (dyspnea on exertion), Dysthymia (RAF-HCC), Fatigue, Foot pain, GERD (gastroesophageal reflux disease) (RAF-HCC), Hepatitis C, Loosening of knee joint prosthesis (CMS-HCC), Migraine, MRSA (methicillin resistant staph aureus) culture positive, Neuropathic pain, URI (upper respiratory infection), and Vitamin D deficiency.     Surgical History   She  has a past surgical history that includes Knee surgery (Left); Toe amputation (Right); Hand surgery (Bilateral); Mouth surgery; Bunionectomy (Bilateral); TONSSILLECTOMY; Tubal ligation; CARPOMETACARPEL SUSPENSION PLASTY (Bilateral); Hysterectomy; Joint replacement (Left); Umbilical hernia repair; pr revise knee joint replace,all parts (Left, 11/04/2016); pr incis/drain thigh/knee abscess,deep (Left, 11/11/2016); pr exc sweat gland lesn perineal,simpl (Right, 03/09/2019); pr exc sweat gland lesn perineal,simpl (Left, 09/14/2019); and pr exc sweat gland lesn perineal,complx (Bilateral, 04/06/2021).     Allergies   Buprenorphine hcl, Codeine, Adhesive, Iodinated contrast media, Latex, Morphine, and Opioids - morphine analogues   Medications   She has a current medication list which includes the following prescription(s): advair diskus, albuterol, amoxicillin-clavulanate, empty container, empty container, golimumab, hydroquinone, ibuprofen, ipratropium-albuterol, pregabalin, premarin, by questionnaire and noted in the electronic chart.  Positives noted/discussed.  Balance of systems was negative.    Fever/chills: denies  Recent unexplained weight loss: denies  Bowel/bladder symptoms: denies   Family History Her family history includes Cancer in her father and sister; Diabetes in her mother; Hypertension in her father and mother.     Social History She  reports that she has quit smoking. Her smoking use included cigarettes. She has a 15.00 pack-year smoking history. She has never used smokeless tobacco. She reports that she does not drink alcohol and does not use drugs.        Occupational History   ??? Not on file        OBJECTIVE:     PHYSICAL EXAM:  Vitals: Temp 36.2 ??C (97.2 ??F)  - Ht 175.3 cm (5' 9)  - Wt Marland Kitchen)  105.1 kg (231 lb 12.8 oz)  - BMI 34.23 kg/m??   Appearance: well-nourished and no acute distress   Skin: No cyanosis or clubbing in bilat hands. and No edema in the feet.  Pulses: 2+ DP pulses bilaterally     Neurologic exam:   Mental Status: Elizabeth Browning is oriented to time, place and person & is alert and cooperative  Motor: Normal bulk and tone without evidence of pronator drift or fasciculations. No abnormal movements.  Gait: antalgic, slow.    Cerebellum/Coordination: Deferred    Motor R L  Reflexes R L   IP 5 4 (pain)  Patellar absent absent   Quad 5 4 (pain)  Achilles absent absent       Pathologic R L   TA 5 5  Hoffmann's Deferred Deferred   EHL 5 5  Clonus neg. neg.   GS 5 5  Babinski neg. neg.     Sensory: Sensation to light touch intact throughout.    Thoracolumbar Spine Exam:  Inspection: No swelling, erythema, deformity, atrophy or hypertrophy noted  Lumbar Spine ROM: moderately restricted, painful flexion/extension  Spine Palpation: normal skin, diffusely painful to palpation L4-S1 midline and left paraspinal region    Facet Loading Test: Positive Left  Nerve Tension Signs: Slump test positive left    Sacroiliac Joint:   Inspection: Normal alignment. No erythema, discoloration, or asymmetry.  Palpation: Pain to palpation at Left PSIS    Right Hip/Knee:   No pain with hip internal rotation/loading. Normal internal rotation ROM.   No knee pain with flexion/extension. Normal ROM and stability.  Left Hip/Knee:   Normal internal rotation ROM. Groin/hip pain with internal rotation/loading.   No knee pain with flexion/extension. Normal ROM and stability.       MEDICAL DECISION MAKING    Test Results:  Imaging:   L-spine XR. [place]. Date:Today.  Impression: ***    {smsimagetype:78711} {smsimagelocation:66374::[place].} Date:{smsdate:66375::Today.}  Impression: ***      {smsimageinterp:65666::I, Duke Salvia, NP, personally interpreted the images.,Images were reviewed with the patient on the PACS monitor.,The available reports were reviewed.}    Labs:  Lab Results   Component Value Date    A1C 6.0 (H) 08/18/2017       Discussion:  ?? {smscounseling:66376}      E&M Coding:    MEDICAL DECISION MAKING (level of service defined by 2/3 elements)     Number/Complexity of Problems Addressed {[facility] MDM Problems:71100}   Amount/Complexity of Data to be Reviewed/Analyzed {[facility] MDM Data:71101}   Risk of Complications/Morbidity/Mortality of Management {[facility] MDM Risk:71105}     Or TIME     Total Time for E/M Services on the Date of Encounter {Time Billing:76135::N/A}          cc: Peyton Najjar, PA, Lilian Kapur, PA Risk of Complications/Morbidity/Mortality of Management Prescription Medication (99204/99214)     Or TIME     Total Time for E/M Services on the Date of Encounter N/A          cc: Peyton Najjar, PA, Lilian Kapur, PA

## 2021-09-18 ENCOUNTER — Ambulatory Visit: Admit: 2021-09-18 | Discharge: 2021-09-19 | Payer: MEDICAID

## 2021-09-18 DIAGNOSIS — L732 Hidradenitis suppurativa: Principal | ICD-10-CM

## 2021-09-18 MED ORDER — OXYCODONE-ACETAMINOPHEN 10 MG-325 MG TABLET
ORAL_TABLET | ORAL | 0 refills | 4 days | Status: CP | PRN
Start: 2021-09-18 — End: ?

## 2021-09-18 MED ORDER — AMOXICILLIN 875 MG-POTASSIUM CLAVULANATE 125 MG TABLET
ORAL_TABLET | Freq: Two times a day (BID) | ORAL | 2 refills | 30 days | Status: CP
Start: 2021-09-18 — End: 2021-12-17

## 2021-09-18 MED FILL — SIMPONI 100 MG/ML SUBCUTANEOUS PEN INJECTOR: 28 days supply | Qty: 2 | Fill #11

## 2021-09-18 NOTE — Unmapped (Unsigned)
Department of Anesthesiology  [facility]  [address], [address]  [place], [place] [address]  [phone]    Assessment and Plan  Attending: Hedwig Morton [name] is a 56 y.o. female with a PMHx of asthma, GERD, migraine, obesity, hep C, hx of cervical ca. She is being seen at the [facility] as referral for chronic buttock and sacral pain secondary to hidradenitis suppurativa s/p multiple surgeries last on 04/06/21 . She also expresses concerns regarding left hip pain.    1. Hidradenitis suppurativa    2. Left hip pain      March 2023  At initial visit in December, ***patient has a history of hidradenitis suppurativa s/p multiple excisions which she associates with her sacral pain. She describes the pain as tight and sore around her excision site. Regarding medication management, patient notes percocet and morphine have worked well in the past. After further probing of all multimodal trials, she reports she has been on Lyrica in the past with benefit and no adverse side effects and is interested in restarting this medication. She has not tried topicals in the past which can be considered in the future once skin is healed. Patient presents with pain localized to her left hip without radiation into the groin. Her pain began 6 months ago without any inciting events and injuries and has progressively worsened. Her pain is aching and dull, which worsens with ambulation and certain movements. She has relied on OTC NSAIDs for pain relief. She is following with Orthopedics on 07/18/21 for her left hip pain and does not have any prior imaging. We will order a left hip XR.    Today, ***    Hidradenitis suppurativa  ***  Chronic, not at goal.   - Start pregabalin (LYRICA) 50 MG capsule; Take 1 capsule (50 mg total) by mouth Three (3) times a day.  Dispense: 90 capsule; Refill: 2    Left hip pain  ***   - Counseled on safe NSAID and tylenol usage for which the patient expressed understanding  - Lyrica as above  - Order left hip XR  - Follow with Orthopedics on 07/18/21  - Follow in 6-8 weeks  - XR Hip 2 Views Left; Future    General Recommendations: The pain condition that the patient suffers from is best treated with a multidisciplinary approach that involves an increase in physical activity to prevent de-conditioning and worsening of the pain cycle, as well as psychological counseling (formal and/or informal) to address the co-morbid psychological affects of pain.  Treatment will often involve judicious use of pain medications and interventional procedures to decrease the pain, allowing the patient to participate in the physical activity that will ultimately produce long-lasting pain reductions.  The goal of the multidisciplinary approach is to return the patient to a higher level of overall function and to restore their ability to perform activities of daily living.    Future Considerations:  - lidocaine gel over healed excision   - Titration of Lyrica  - Topamax    -No follow-ups on file.    No orders of the defined types were placed in this encounter.    Requested Prescriptions      No prescriptions requested or ordered in this encounter     Patient does  appear to be utilizing pain medications appropriately and does  report that the medications do improve patient's quality of life and functionality level.  At today's visit, the patient reports  fair analgesia from their current medication regimen without significant adverse effects.    Current Pain Provider: no  Urine toxicology: None  Urine toxicology screen N/A appropriate.   Last Opioid Change: N/A  Last EKG: N/A  Previous Compliance Issues: None  Naloxone ordered: N/A  Total morphine equivalents: 0  Benzodiazepine: No.  Pain Psychology: N/A  [place] DOC: None  [place] database was reviewed 06/25/21.      HPI:  [name] is seen in consultation at the request of Arsenio Katz*  For evaluation and recommendations regarding Her chronic pain.     Patient was initially seen in December. Since then, the patient has followed with Plastic Surgery, Ortho, and Derm. She obtained imaging with Radiology of XR Lumbar Spine and XR Hip Left    Today, ***    In terms of medications, ***    Pain Clinic? No    Current Medications:  OTC Aleve    The patient states her pain is located *** and the severity of her pain ranges from ***/10 to ***/10.  Her pain currently is ***/10 and on average is ***/10.  She describes the sensation of her pain as {pain described:58928}. Her pain is present {pain frequency:58931} and worst {pain worst:59685}. The patient???s pain impacts {negatively affected:59709}. Her interval history includes {interval history:59710}. Her pain {pain changed:59711}, and she {does does not blank:47747} have new pain to discuss today. She {is/is not:36772} on blood thinners or anti-coagulants. In regards to medications currently taken for pain management, the patient {is:54246::is} tolerating these medications well and complains of associated side effects: {pain med side effects:59712}.    Previous interventions include lumbar MBB's. No record per chart review, was done in a clinic in [place].       Previous Medication Trials: lyrica, oxycodone, trazodone, ibuprofen     Social History:  She reports that she has quit smoking. Her smoking use included cigarettes. She has a 15.00 pack-year smoking history. She has never used smokeless tobacco. She reports that she does not drink alcohol and does not use drugs.      Allergies as of 09/19/2021 - Reviewed 09/18/2021   Allergen Reaction Noted   ??? Buprenorphine hcl Hives 01/24/2015   ??? Codeine Rash, Hives, and Itching 01/19/2015   ??? Adhesive Rash 12/26/2015   ??? Iodinated contrast media Rash 01/24/2015   ??? Latex Rash 01/24/2015   ??? Morphine Itching 12/07/2015   ??? Opioids - morphine analogues Itching 02/21/2012      Current Outpatient Medications   Medication Sig Dispense Refill   ??? ADVAIR DISKUS 250-50 mcg/dose diskus INHALE 1 PUFF BY MOUTH TWICE DAILY 12 HOURS APART     ??? albuterol HFA 90 mcg/actuation inhaler Inhale 2 puffs every four (4) hours as needed.     ??? atorvastatin (LIPITOR) 40 MG tablet Take 1 tablet by mouth daily.     ??? cefdinir (OMNICEF) 300 MG capsule Take 1 capsule (300 mg total) by mouth Two (2) times a day. 60 capsule 0   ??? DEXILANT 60 mg capsule      ??? empty container Misc use as directed 1 each 2   ??? empty container Misc Use as directed 1 each 2   ??? golimumab (SIMPONI) 100 mg/mL PnIj Inject the contents of 2 pens (200mg ) under the skin on week 0 and week 2 as loading doses. 4 mL 0   ??? golimumab 100 mg/mL PnIj Inject the contents of 2 pens (200mg ) under the skin every 4 weeks as  maintenance. 2 mL 11   ??? hydroquinone 4 % cream Apply topically Two (2) times a day. Use for up to 12 weeks 30 g 3   ??? ibuprofen (MOTRIN) 800 MG tablet Take 800 mg by mouth every six (6) hours as needed.      ??? ipratropium-albuteroL (DUO-NEB) 0.5-2.5 mg/3 mL nebulizer USE 1 VIAL VIA NEBULIZER EVERY 4 TO 6 HOURS AS NEEDED FOR COUGH OR WHEEZING     ??? oxyCODONE (ROXICODONE) 5 MG immediate release tablet Take 1 tablet (5 mg total) by mouth every eight (8) hours as needed for pain for up to 10 doses. 10 tablet 0   ??? pantoprazole (PROTONIX) 20 MG tablet Take 40 mg by mouth daily.     ??? pregabalin (LYRICA) 50 MG capsule Take 1 capsule (50 mg total) by mouth Three (3) times a day. 90 capsule 2   ??? PREMARIN 0.625 mg/gram vaginal cream      ??? tiZANidine (ZANAFLEX) 4 MG tablet Take 1 tablet (4 mg total) by mouth every eight (8) hours as needed. 30 tablet 1   ??? traZODone (DESYREL) 50 MG tablet Take 50-100 mg by mouth nightly.     ??? URO-MP 118-10-40.8-36 mg cap TAKE 1 CAPSULE BY MOUTH FOUR TIMES DAILY AS NEEDED       No current facility-administered medications for this visit.     Imaging/Tests:   XR Lumbar Spine 09/04/21  FINDINGS:   There are 5 nonrib-bearing lumbar-type vertebral bodies. There is ankylosis of L3 and L4. Grade 1 anterolisthesis of L4 on L5. No acute appearing vertebral body height loss. Moderate to severe disc height loss at L4-L5 and L5-S1 with severe facet arthropathy at these levels. Sacroiliac joints are normally aligned.  ??  IMPRESSION:  ??  1. Ankylosis of L3-L4, favored congenital.  2. Advanced degenerative disc and facet disease at L4-5 and L5-S1 with grade 1 anterolisthesis of L4-L5.    XR Hip Left 07/18/21  FINDINGS AND   IMPRESSION:  ??  Suboptimal positioning on the AP view of the left hip limits evaluation.  ??  Within the limitations, no fracture or malalignment. Lucencies in the left femoral head are favored to represent degenerative subcortical cysts.  ??  Mild osteoarthritis of both hips. Mild periarticular sclerosis at the bilateral SI joints, likely degenerative. Advanced lower lumbar degenerative changes.    Knee XRAY 12/30/16-  Standing AP of both knees and lateral of the left knee show a total knee replacement the implants appeared be well fixed. Special attention was paid to the femoral and tibial components that which have not change in alignment is no signs of ostial lysis or loosening of the implants certainly no fracture dislocation subluxation or tumor seen                  Lab Results   Component Value Date    CREATININE 0.97 (H) 08/22/2021     Lab Results   Component Value Date    ALKPHOS 87 03/13/2019    BILITOT 0.4 03/13/2019    BILIDIR <0.10 03/13/2019    PROT 8.0 03/13/2019    ALBUMIN 3.9 03/13/2019    ALT 12 08/22/2021    AST 25 08/22/2021     Lab Results   Component Value Date    PLT 412 08/22/2021     Urine toxiciology screen  No results found for: AMPHU, BARBU, BENZU, CANNAU, METHU, OPIAU, COCAU    OPIOID CONFIRMATION:  No results found for: CDIFFTOX, NBUPR, LABCO,  HYDROCODONE, HYDROMORPH, MORPHINE, OXYCODONE, OXMU, MAMU, OPIU     BENZODIAZEPINE CONFIRMATION:  No results found for: CDIFFTOX, CDIFFTOX, OHALP, CLONU, HDXYFLRAZUR, 7NHFLU, DESALKYCONF, LORAZURQT, HDXYTRIAZUR, MIDAZURQT, BNZU    Review of Systems:  General {ajlrosgen:46920}  Cardiovascular {aggROSCV:37303}  Gastrointestinal {aggROSGI:37304}  Skin {aggROSskin:37305}  Endocrine {aggROSendo:37306}  Musculoskeletal {aggROSmusk:37307}  Neurologic {aggROSneu:37308}  Psychiatric {aggROSpsych:37309}    OBJECTIVE  PHYSICAL EXAM:  There were no vitals taken for this visit.  Wt Readings from Last 3 Encounters:   09/04/21 (!) 105.1 kg (231 lb 12.8 oz)   08/22/21 (!) 106.1 kg (234 lb)   08/06/21 (!) 107 kg (236 lb)     GENERAL:  The patient is well developed, well-nourished, and appears to be in no apparent distress.   HEAD/NECK:    Normocephalic/atraumatic. clear sclera, pupils not pinpoint  CV:  RR  LUNGS:   Normal work of breathing, no supplemental O2  EXTREMITIES:  No clubbing, cyanosis noted.  NEUROLOGIC:    The patient is alert and oriented, speech fluent, normal language.   MUSCULOSKELETAL:    Motor function  preserved. Good range of motion of all extremities.   GAIT:  The patient rises from a seated position with no difficulty and ambulates with nonantalgic gait without the assistance of a walking aid.   SKIN:   No obvious rashes, lesions, or erythema.  PSY:   Appropriate affect. No overt pain behaviors. No evidence of psychomotor retardation or agitation, no signs of intoxication.       I have personally reviewed the patient's medical record.   I have personally reviewed the patient's clinical labs and/or urine toxicology studies  I have independently reviewed patient's imaging studies.   I have not requested patient's old medical records from the prior provider.   The patient's significant other and/or family member/friend was not present during this visit and has contributed important medical information as pertinent to patient's medical history.   I have reviewed the [place] Narcotic Database today

## 2021-09-18 NOTE — Unmapped (Signed)
The [facility] has made a second and final attempt to reach this patient to refill the following medication:Simponi.      We have left voicemails on the following phone numbers: [phone].    Dates contacted: 3/3 3/6  Last scheduled delivery: 2/3    The patient may be at risk of non-compliance with this medication. The patient should call the [facility] at [phone]  Option 4, then Option 2 (all other specialty patients) to refill medication.    Tawanda Schall D Administrator [facility] Specialty Technician

## 2021-09-18 NOTE — Unmapped (Signed)
[facility] Refill Coordination Note    Specialty Medication(s) to be Shipped:   Inflammatory Disorders: Simponi    Other medication(s) to be shipped: No additional medications requested for fill at this time     Elizabeth Browning, DOB: 17-Aug-1965  Phone: [phone] (home)       All above HIPAA information was verified with patient.     Was a Nurse, learning disability used for this call? No    Completed refill call assessment today to schedule patient's medication shipment from the [facility] [phone]).  All relevant notes have been reviewed.     Specialty medication(s) and dose(s) confirmed: Regimen is correct and unchanged.   Changes to medications: Debbra reports no changes at this time.  Changes to insurance: No  New side effects reported not previously addressed with a pharmacist or physician: None reported  Questions for the pharmacist: No    Confirmed patient received a Conservation officer, historic buildings and a Surveyor, mining with first shipment. The patient will receive a drug information handout for each medication shipped and additional FDA Medication Guides as required.       DISEASE/MEDICATION-SPECIFIC INFORMATION        For patients on injectable medications: Patient currently has 0 doses left.  Next injection is scheduled for 03/08.    SPECIALTY MEDICATION ADHERENCE     Medication Adherence    Patient reported X missed doses in the last month: 0  Specialty Medication: Simponi  Patient is on additional specialty medications: No  Any gaps in refill history greater than 2 weeks in the last 3 months: no  Demonstrates understanding of importance of adherence: yes  Informant: patient  Reliability of informant: reliable  Provider-estimated medication adherence level: good  Patient is at risk for Non-Adherence: No  Reasons for non-adherence: no problems identified  Confirmed plan for next specialty medication refill: delivery by pharmacy  Refills needed for supportive medications: not needed          Refill Coordination    Has the Patients' Contact Information Changed: No  Is the Shipping Address Different: No         Were doses missed due to medication being on hold? No    simponi 100 mg/ml: 0 days of medicine on hand         REFERRAL TO PHARMACIST     Referral to the pharmacist: Not needed      [facility]     Shipping address confirmed in Epic.     Delivery Scheduled: Yes, Expected medication delivery date: 03/08.     Medication will be delivered via UPS to the prescription address in Epic WAM.    [name]   [facility] Specialty Technician

## 2021-09-18 NOTE — Unmapped (Addendum)
 Dermatology Note     Assessment and Plan:      Hidradenitis Suppurativa,??severe [place] 3  Chronic problem with exacerbation, not at goal??  - s/p L thigh, bilateral axillae, bilateral buttocks surgery with Dr Lafayette Dragon [facility] plastic surgery with partial improvement in sx  -??We discussed the typical natural history, pathogenesis, treatment options, and expected course as well as the relapsing and sometimes recalcitrant nature of the disease.????  - Failed Humira 40 and 80 mg weekly, golimumab 200 mg every 4 weeks, infliximab 5mg /kg; feels some benefit to augmentin; did not tolerate spironolactone (dizziness)  - Discussed option ILK and patient declined  - STOP simponi, though patient had been instructed to stop at time of starting infliximab  - INCREASE infliximab from 5mg /kg Q 8 weeks to 10mg /kg Q 8 weeks; will consider increased frequency after assessing response. R/B/A received including but not limited to risk of infection, infusion site reactions.  - CONTINUE augmentin BID  - CONTINUE - oxyCODONE-acetaminophen (PERCOCET) 10-325 mg per tablet; Take 1 tablet by mouth every four (4) hours as needed for pain. #20  - Given poor pain control to date, will refer to Dr. Adriana Mccallum  - patient to consult plastic surgery 10/2021 regarding right inframammary area  - Form for patient to request financial assistance from govt office was completed per her request, mailed to her, faxed to office listed on sheet, and scanned to chart.      ??  High risk medication use??(infliximab):  - Quant gold negative and Hep C RNA non-reactive 04/2021  - BUN, creatinine, AST, ALT, C-reactive protein, Sedimentation rate from 08/22/2021 WNL   -Patient has a history of treated hepatitis C (glecaprevir/pibrentas course early 2018 with undetectable viral load since then). ??  - CBC w diff, Cr, BUN, AST, ALT, ESR, CRP with each remicade infusion every 8 weeks       The patient was advised to call for an appointment should any new, changing, or symptomatic lesions develop.     RTC: Return in about 3 months (around 12/19/2021) for hs fu 30 min. or sooner as needed   _________________________________________________________________      Chief Complaint     Follow up of HS    HPI     Elizabeth Browning is a 56 y.o. female who presents as a returning patient (last seen by Dr. Elder Cyphers on 04/25/2021) to Dermatology for follow up of HS. At last visit, she was to start infliximab infusions at 5 mg/kg every 8 weeks, continue golimumab 200 mg every 4 weeks (stop once remicade starts), stop augmentin 875-125 mg per tablet, and start cefdinir 300 mg twice daily for HS.     Today, patient reports she has not improved on current regimen, and she is still taking the simponi shots in addition to remicade.  Complains of recent flare on right inframammary and would like to consider surgery for this area.  Denies adverse effects of current regimen, however still with debilitating pain and drainage from many sites even s/p excision incl axillae, buttocks, abdomen, and groin    The patient denies any other new or changing lesions or areas of concern.     Pertinent Past Medical History     No history of skin cancer    Family History:   Negative for melanoma    Past Medical History, Family History, Social History, Medication List, Allergies, and Problem List were reviewed in the rooming section of Epic.     ROS: Other than symptoms mentioned  in the HPI, no fevers, chills, or other skin complaints    Physical Examination     GENERAL: Well-appearing female in no acute distress, resting comfortably.  NEURO: Alert and oriented, answers questions appropriately  PSYCH: Normal mood and affect  SKIN: Examination of the face, neck, chest, abdomen, back, bilateral upper extremities, bilateral lower extremities, buttocks and genitalia was performed  Inflammatory nodules, sinus tract, and rope-like scars at periphery of excision sites of axillae, left buttock; one draining abscess right inframammary  Well healed s/p excision bilateral buttocks and right posterior thigh.    All areas not commented on are within normal limits or unremarkable    Scribe's Attestation: Ethel Rana, PA obtained and performed the history, physical exam and medical decision making elements that were entered into the chart.  Signed by Aggie Cosier, Scribe, on September 17, 2021 at 7:30 PM.    ----------------------------------------------------------------------------------------------------------------------  September 18, 2021 9:08 PM. Documentation assistance provided by the Scribe. I was present during the time the encounter was recorded. The information recorded by the Scribe was done at my direction and has been reviewed and validated by me.  ----------------------------------------------------------------------------------------------------------------------      (Approved Template 03/27/2020)

## 2021-09-18 NOTE — Unmapped (Addendum)
You are welcome to join the [facility] [place] [facility] Support group online at [email or web address] or in-person at our regular meetings.  You can textHS to [id number] for meeting reminders or join the group online for regular updates.  This can be a great opportunity to interact and learn from other patients and help work with the HS community.  We hope to see your there!    Hidradentis Suppurativa (pronounced ???high-drad-en-eye-tis/sup-your-uh-tee-vah???) is a chronic disease of hair follicles.  The lesions occur most commonly on areas of skin-to-skin contact: under the arms (axillary area), in the groin, around the buttocks, in the region around the anus and genitals, and on the skin between and under the breasts. In women, the underarms, groin, and breast areas are most commonly affected. Men most often have HS lesions on the buttocks and under the arms and may also have HS at the back of the neck and behind and around the ears.    What does HS look and feel like?   The first thing that someone with HS notices is a tender, raised, red bump that looks like an under-the-skin pimple or boil. Sometimes HS lesions have two or more ???heads.???  In mild disease only an occasional boil or abscess may occur, but in more active disease there can be many new lesions every month.  Some abscesses can become larger and may open and drain pus.  Bleeding and increased odor can also occur. In severe disease, deeper abscesses develop and may connect with each other under the skin to form tunnel-like tracts (sinuses, fistulas).  These may drain constantly, or may temporarily improve and then usually begin draining again over time.  In people who have had sinus tracts for some time, scars form that feel like ropes under the skin. In the very worst cases, networks of sinus tracts can form deeper in the body, including the muscle and other tissues. Many people with severe HS have scars that can limit their ability to freely move their arms or legs, though this is very unlikely for most patients.     Clinicians usually classify or ???grade??? HS using the Hurley staging system according to the severity of the disease for each body location:   Hurley stage I: one or more abscesses are present, but no sinus tracts have formed and no scars have developed   Hurley stage II: one or more abscesses are present that resolve and recur; on sinus tract can be present and scarring is seen   Hurley stage III: many abscesses and more than one sinus tract is present with extensive scars.    What causes HS?  The cause of HS is not completely understood.  It seems to be a disorder of hair follicles and often many family members are affected so genetics probably play a strong role.  Bacteria are often present and may make the disease worse, but infection does not seem to be the main cause. Hormones are also likely play a role since the condition typically starts around puberty when hair follicles under the arms and in the groin start to change.  It can sometimes flare with menstrual cycles in women as well.  In most cases it lasts for decades and starts to improve to some extent in the late 30s and 40s as long as many fistulas have not already formed.  Women are three times more likely than men to develop HS.    Other factors are known to contribute to HS   flaring or becoming worse, though they are likely not the main causes. The factors most commonly associated with HS include:   Cigarette smoking - Stopping smoking will likely not cure the disease, but likely is helpful in reducing how much and how often it flares and may prevent it from getting as bad over time.   Higher weight - HS may occur even in people that are not overweight, but it is much more common in patients that are.  There is some evidence that losing weight and eating a diet low in sugars and fats may be helpful in improving hidradenitis, though this is not helpful for everyone.  Working with a nutritionist may be an important way to help with this and is something your physician can help coordinate    Hidradenitis is not contagious.  It is not caused by a problem with personal hygiene or any other activity or behavior of those with the disease.    How can your doctor help you treat your hidradenitis?  Clinicians use both medication and surgery to treat HS. The choice of treatment--or combination of treatments--is made according to an individual patient???s needs. Clinicians consider several factors in determining the most appropriate plan for therapy:   Severity of disease - medications and some laser treatments are usually able to control disease best when fistulas are not present.  Fistulas typically require surgery.   Extent and location of disease   Chronicity (how often the lesions recur)    A number of different surgical methods have been developed that are useful for certain patients under particular circumstances. These can be done with local numbing and healing at home for some areas when disease is not too extensive with relatively brief recovery times.  In more extensive disease there may be a need for larger excisions under general anesthesia with healing time in the hospital and prolonged recovery periods for better disease control.      In addition, many medical treatments have been tried--some with more success than others. No medication is effective for all patients, and you and your doctor may have to try several different treatments or combinations of treatments before you find the treatment plan that works best for you.  The goals of therapy with medications that are either topical (used on the skin) or systemic (taken by mouth) are:  1. to clear the lesions or at least reduce their number and extent, and  2. to prevent new lesions from forming.  3. To reduce pain, drainage, and odor  Some of the types of medications commonly used are antibacterial skin washes and the topical antibiotics to prevent secondary infections and corticosteroid injections into the lesions to reduce inflammation.     Other medications that may be used include retinoids (similar to Accutane), drugs that effect how hormones and hair follicles interact, drugs that affect your immune system (such as methotrexate, adalimumab/Humira, and Remicaid/infliximab), steroids, and oral antibiotics.    Lasers that destroy hair follicles can also be helpful since they reduce the hair follicles that cause the problems.  Multiple treatments are typically required over time and there is some discomfort associated with treatment, but it is typically very fast and well-tolerated.    It is very important to realize that hidradenitis cannot usually be completely cured with any single medication or surgical procedure.  It is a disease that can be very stubborn and difficult to control, but with good treatment a lot of improvement and sometimes temporary remissions can be   obtained. Poorly controlled disease can cause more fistulas to form and make managing the disease much more difficult over time so it is important to seek care to reduce major flares.  Surgery can provide a long term cure in some areas, though the disease can start again or continue in nearby areas.  A dermatologist is often the best person to help coordinate disease treatment, and sometimes other surgeons, pain specialists, other specialists, and nutritionists may be part of the treatment team.    For severe disease, the first goal is often to reduce pain and symptoms with medicines so that the disease feels more stable. Once it's stable, we often start thinking about how to address areas that have completely gotten better with surgery if they are still causing problems.    What can you do to help your HS?  1. Stopping smoking is hard and may not fix everything, but it may be a step in the right direction.  We or your primary care physician can provide resources to help stop if you are interested.  2. Follow a healthy diet and try to achieve a healthy weight.  Some other self-help measures are:   Keep your skin cool and dry (becoming overheated and sweating can contribute to an HS flare)   To reduce the pain of cysts or nodules or to help them to drain, apply hot compresses or soak in hot water for 10 minutes at a time (use a clean washcloth or a teabag soaked in hot water)   For female patients, cotton underwear that does not have tight elastic in the groin can be helpful.  Boyshort, brief, or boxer style underwear may be a better option as friction on hair follicles in affected areas can be a major trigger in some patients.  These can be easily found on [employer] or with some retailers.  Fruit of the Loom and Underworks are two brands that are sometimes recommended.    Finally, know that you are not alone. Coping with the pain and other symptoms of HS can be very difficult, so it may be helpful to connect with others who live with HS. Patient groups and networks can be sources of important information and support. Some internet resources for information and connections are provided below.    Psychologytoday.com is a resource to find psychologists and therapists that can help support you in your are     AASECT.org can help connect with sexual health resources and counselors    Resources for Information    The Hidradenitis Suppurativa Foundation: A nonprofit organized by a group of physicians interested in treating and advancing research in hidradenitis suppurativa.  This group advocates for better care and research for hidradenitis and has educational materials put together specifically for patients that have been reviewed and produced by doctors and people with hidradenitis.    American Academy of Dermatology  http://www.aad.org/dermatology-a-to-z/diseases-and-treatments/e---h/hidradenitis-suppurativa/signs-and-symptoms    National Library of Medicine  http://www.nlm.nih.gov/medlineplus/hidradenitissuppurativa.html  NORD: National Organization for Rare [employer], Inc  https://www.rarediseases.org/rare-disease-information/rare-diseases/byID/358/viewAbstract  Trials of new medications for HS  Https://www.clinicaltrials.gov

## 2021-09-19 ENCOUNTER — Ambulatory Visit: Admit: 2021-09-19 | Payer: MEDICAID

## 2021-09-19 DIAGNOSIS — L732 Hidradenitis suppurativa: Principal | ICD-10-CM

## 2021-09-19 MED ORDER — OXYCODONE-ACETAMINOPHEN 10 MG-325 MG TABLET
ORAL_TABLET | ORAL | 0 refills | 4.00000 days | Status: CP | PRN
Start: 2021-09-19 — End: ?

## 2021-09-19 NOTE — Unmapped (Signed)
Patient called stating the oxycodone is not at her pharmacy.  Rx looks like it was printed instead of sent to pharmacy.

## 2021-09-19 NOTE — Unmapped (Signed)
Spoke with pharmacist at [facility]  Per pharmacist, can not fax or call in controlled substance prescriptions.

## 2021-09-20 NOTE — Unmapped (Signed)
LM on personal voicemail that Lottie Dawson, PA has mailed to her home address the prescription for the pain medication.

## 2021-09-21 DIAGNOSIS — L732 Hidradenitis suppurativa: Principal | ICD-10-CM

## 2021-10-02 ENCOUNTER — Ambulatory Visit: Admit: 2021-10-02 | Discharge: 2021-10-03 | Payer: MEDICAID

## 2021-10-02 DIAGNOSIS — G8929 Other chronic pain: Principal | ICD-10-CM

## 2021-10-02 DIAGNOSIS — M4326 Fusion of spine, lumbar region: Principal | ICD-10-CM

## 2021-10-02 DIAGNOSIS — M48061 Spinal stenosis, lumbar region without neurogenic claudication: Principal | ICD-10-CM

## 2021-10-02 DIAGNOSIS — M5416 Radiculopathy, lumbar region: Principal | ICD-10-CM

## 2021-10-02 DIAGNOSIS — M5442 Lumbago with sciatica, left side: Principal | ICD-10-CM

## 2021-10-02 DIAGNOSIS — L732 Hidradenitis suppurativa: Principal | ICD-10-CM

## 2021-10-02 MED ORDER — OXYCODONE-ACETAMINOPHEN 10 MG-325 MG TABLET
ORAL_TABLET | ORAL | 0 refills | 4 days | Status: CP | PRN
Start: 2021-10-02 — End: ?

## 2021-10-02 MED ORDER — PREDNISONE 50 MG TABLET
ORAL_TABLET | Freq: Three times a day (TID) | ORAL | 0 refills | 1 days | Status: CP | PRN
Start: 2021-10-02 — End: ?

## 2021-10-02 MED ORDER — LORAZEPAM 0.5 MG TABLET
ORAL_TABLET | Freq: Once | ORAL | 0 refills | 0.00000 days | Status: CP | PRN
Start: 2021-10-02 — End: 2021-10-02

## 2021-10-02 MED ORDER — METHOCARBAMOL 750 MG TABLET
ORAL_TABLET | Freq: Three times a day (TID) | ORAL | 2 refills | 10 days | Status: CP | PRN
Start: 2021-10-02 — End: ?

## 2021-10-02 NOTE — Unmapped (Addendum)
Medications: Take over-the-counter medications as needed and as tolerated  If you don't have liver disease, the safest medication for pain is acetaminophen Extended Release (Tylenol Arthritis).  Take 650mg  (1 extended release tab) at a time for pain relief every 8 hours.  Do not take more than 3000mg  per day.  Take medications as precribed by your Medical Team  We have prescribed a muscle relaxer, methocarbamol 750 mg.  Take as needed as prescribed.  Activity: Activities as tolerated.  Conservative Care: We referred you to Physical Therapy.  We referred you for a spinal injection (left L5/S1 TFESI)  We have prescribed Ativan for anxiety prior to your spine injection.  Take 1 pill 45 minutes before spine injection, 2nd pill available during spine injection if needed.  Steroid premedication regimen for history of contrast allergy: 1. Prednisone - 50 mg PO, taken 13, 7, and 1 hour prior to the procedure*.  2. Diphenhydramine - 50 mg PO, taken 1 hour prior to the procedure**   Surgical Care: You will follow-up with an Orthopaedic Spine surgeon for a consult.  You are assessed to be a possible surgical candidate.  Imaging studies: CT of the Lumbosacral spine was ordered. Medical necessity: Radiographs have been performed/ordered. Failed supervised conservative treatments. Necessary for surgical planning.  Labs: None  Return for Next scheduled follow up with Dr. Lorenda Peck in 6-8 weeks.     Contact our nursing team via MyChart or [phone] with any clinical questions/concerns.  Our scheduling team can be reached at [phone].

## 2021-10-02 NOTE — Unmapped (Signed)
Received call from patient on nurse triage line requesting a refill of percocet.    Call to patient. Patient received the prescription that was mailed on 09/19/2021 and is requesting a new prescription.    Medication pended if appropriate.

## 2021-10-02 NOTE — Unmapped (Signed)
I provided the patient with a copy of the injection instruction sheet??and??reviewed to prepare for??an injection.??A Patient Decision Aid was used??during??the education session.??Patient will hold??ibuprofen for 2 days.?? Knows: to bring a driver,??that it is??permissible to eat and drink prior to appointment, to arrive 30??minutes prior to??appointment and??to call and reschedule if there are signs/symptoms of??illness??or if antibiotics??have been started for any reason. Pt. has a h/o rash with contrast so she was prescribed Prednisone 50 mg x3 doses. I reviewed the allergy prep protocol with her, which is written on her AVS. She has done this in the past and was familiar with the instructions. Verbalized understanding of all information.

## 2021-10-02 NOTE — Unmapped (Signed)
ORTHOPAEDIC SPINE CLINIC NOTE       Bertram Millard. Lorin Picket, DNP  Nurse Practitioner  [email or web address]  [phone]        Patient Name:Elizabeth Browning  MRN: [id number]  DOB: 12-06-1965    Date: 10/02/2021    PCP: Lilian Kapur, PA    ASSESSMENT:   Elizabeth Browning  56 y.o. female  [id number]  Anxiety, asthma, hep C, vitamin D deficiency  Chronic LBP and left lumbar radiculopathy (to foot) since 04/2021  MR L Hip 08/2021: probable lateral left acetabular labral tear.  XR-L 08/2021: Ankylosis L3-L4, L4-L5 spondy, advanced spondylosis L4-S1  MR-L 09/2021: severe foram stenosis L4-S1      Elizabeth Browning is a 56 y.o. female with chronic low back pain with left lumbar radiculopathy (to foot) since 04/2021.  Known left hip labral tear with no relief from recent hip injection.  Physical exam consistent with a left lumbar radiculopathy.    Symptoms are assessed to correlate with lumbar MRI findings of severe left foraminal stenosis at L5-S1 and severe bilateral foraminal stenosis L4-L5.  Possible surgical candidate.    PROM:  None     PLAN and RECOMMENDATIONS:     Options reviewed with patient.  Will prescribe methocarbamol for symptom management. We will proceed with a epidural steroid injection at this time for symptom management  and diagnostic purposes as part of a surgical work-up. (Note: Ativan prescribed for anxiety, history of iodine contrast allergy, prednisone prescribed for premedication).  We will obtain a lumbar CT for surgical planning.  Physical therapy prescription provided to begin after ESI and prior to consult with Dr. Lorenda Peck.  Patient will follow-up with Dr. Lorenda Peck for surgical consult after CT and ESI.    Patient Instructions ??   Medications: Take over-the-counter medications as needed and as tolerated  ?? If you don't have liver disease, the safest medication for pain is acetaminophen Extended Release (Tylenol Arthritis).  Take 650mg  (1 extended release tab) at a time for pain relief every 8 hours.  Do not take more than 3000mg  per day.  ?? Take medications as precribed by your Medical Team  ?? We have prescribed a muscle relaxer, methocarbamol 750 mg.  Take as needed as prescribed.  ?? Activity: Activities as tolerated.  ?? Conservative Care: We referred you to Physical Therapy.  ?? We referred you for a spinal injection (left L5/S1 TFESI)  ?? We have prescribed Ativan for anxiety prior to your spine injection.  Take 1 pill 45 minutes before spine injection, 2nd pill available during spine injection if needed.  ?? Steroid premedication regimen for history of contrast allergy: 1. Prednisone - 50 mg PO, taken 13, 7, and 1 hour prior to the procedure*.  2. Diphenhydramine - 50 mg PO, taken 1 hour prior to the procedure**   ?? Surgical Care: You will follow-up with an Orthopaedic Spine surgeon for a consult.  ?? You are assessed to be a possible surgical candidate.  ?? Imaging studies: CT of the Lumbosacral spine was ordered. Medical necessity: Radiographs have been performed/ordered. Failed supervised conservative treatments. Necessary for surgical planning.  ?? Labs: None  ?? Return for Next scheduled follow up with Dr. Lorenda Peck in 6-8 weeks.     Contact our nursing team via MyChart or [phone] with any clinical questions/concerns.  Our scheduling team can be reached at [phone].       Orders Placed This Encounter   Procedures   ??? Transforaminal Lum/Sac or L/S SNRB (53664)   ???  CT Lumbar Spine Wo Contrast   ??? Ambulatory referral to Physical Therapy     Medications Prescribed Today             methocarbamoL (ROBAXIN) 750 MG tablet Take 1 tablet (750 mg total) by mouth Three (3) times a day as needed.    LORazepam (ATIVAN) 0.5 MG tablet Take 1 tablet (0.5 mg total) by mouth once as needed for anxiety for up to 1 dose. Take 1 pill 45 minutes before spine injection, 2nd pill available during spine injection if needed.    predniSONE (DELTASONE) 50 MG tablet Take 1 tablet (50 mg total) by mouth Three (3) times a day as needed.           SUBJECTIVE:     Chief Complaint:  Chief Complaint   Patient presents with   ??? Follow-up     MRI results. Patient states nothing has changed since last visit        History of Present Illness:   Elizabeth Browning is a 56 y.o. female with a relevant PMH of Anxiety, cancer, chronic hepatitis C, GERD, vitamin D deficiency seen for evaluation of low back pain, left leg pain.    Patient was evaluated by Avie Arenas, PA-C 07/18/2021 for left hip and left groin pain with radiating pain down her left leg to her foot.  Symptoms been ongoing since approximately 04/2021.  Left hip injection 08/08/2021 with Dr. Clydene Pugh with no effect.    Left hip x-ray 07/2021: Mild bilateral hip OA.  MRI of left hip 08/2021 probable labral tear.  She also had a history of left TKA and 2 subsequent revisions, and excision of buttock/sacral hidradenitis on 04/06/21.     Has been using a rollator for ambulation since 2019 after knee replacement.    Established Patient Interval History (10/02/2021):  Patient last seen 09/04/2021.  Today, patient reports symptoms are gradually worsening.  ??? No significant relief with tizanidine.  She did take Percocet for short duration.  ??? Significant pain impairing quality of life and sleep.  ??? She still feels like her pain is so severe she cannot participate in physical therapy.    Recent accidents/injuries/falls? no  Recent Relevant ED/Urgent Care Encounters? no  New or worsening neurologic symptoms?  No           10/02/21 1035   PainSc: 10-Worst pain ever   Date of onset: since 04/2021  Location: low back, LLE (to foot)  Frequency: Constant  Modifying factors: worse with standing, worse with walking, worse with lying down  Associated symptoms: numbness, weakness and tingling    Rest relief?:no  Positional relief?: no     Current Treatments Previous Treatments   Current Relevant Pain Medications:  ?? NSAID: Ibuprofen 800 mg  ?? MR: Tizanidine 4mg  (sedating)  ?? AED: Lyrica 50 mg 3 times daily  ?? Opioid: Percocet 10-325mg     Current Physical Therapy: No recent PT    Adjunct Treatments: Activity Modification    In a Pain Clinic: No Prior Relevant Pain Medications:  ???   Prior Physical Therapy: None.    Injections:Yes.   - Left hip injection 08/08/2021  - Lumbar ESIs 2008-2009     Prior Relevant Surgeries: Yes.   - Left TKA w/ 2 revision surgeries        Medical History   She  has a past medical history of Anesthesia to pain, Anxiety, Arthritis, Asthma, Cancer (CMS-HCC), Chronic bronchitis (CMS-HCC), Chronic hepatitis C without hepatic coma (  CMS-HCC), Colon polyp, Difficult intravenous access, DOE (dyspnea on exertion), Dysthymia (RAF-HCC), Fatigue, Foot pain, GERD (gastroesophageal reflux disease) ([facility]-HCC), Hepatitis C, Loosening of knee joint prosthesis (CMS-HCC), Migraine, MRSA (methicillin resistant staph aureus) culture positive, Neuropathic pain, URI (upper respiratory infection), and Vitamin D deficiency.     Surgical History   She  has a past surgical history that includes Knee surgery (Left); Toe amputation (Right); Hand surgery (Bilateral); Mouth surgery; Bunionectomy (Bilateral); TONSSILLECTOMY; Tubal ligation; CARPOMETACARPEL SUSPENSION PLASTY (Bilateral); Hysterectomy; Joint replacement (Left); Umbilical hernia repair; pr revise knee joint replace,all parts (Left, 11/04/2016); pr incis/drain thigh/knee abscess,deep (Left, 11/11/2016); pr exc sweat gland lesn perineal,simpl (Right, 03/09/2019); pr exc sweat gland lesn perineal,simpl (Left, 09/14/2019); and pr exc sweat gland lesn perineal,complx (Bilateral, 04/06/2021).     Allergies   Buprenorphine hcl, Codeine, Adhesive, Iodinated contrast media, Latex, Morphine, and Opioids - morphine analogues   Medications   She has a current medication list which includes the following prescription(s): advair diskus, albuterol, amoxicillin-clavulanate, atorvastatin, dexilant, empty container, empty container, simponi, golimumab, hydroquinone, ipratropium-albuterol, oxycodone, oxycodone-acetaminophen, pantoprazole, premarin, trazodone, uro-mp, ibuprofen, lorazepam, methocarbamol, prednisone, and pregabalin.   Review of Systems A 10-system review was performed by questionnaire and noted in the electronic chart.  Positives noted/discussed.  Balance of systems was negative.    Fever/chills: denies  Recent unexplained weight loss: denies  Bowel/bladder symptoms: denies   Family History Her family history includes Cancer in her father and sister; Diabetes in her mother; Hypertension in her father and mother.     Social History She  reports that she has quit smoking. Her smoking use included cigarettes. She has a 15.00 pack-year smoking history. She has never used smokeless tobacco. She reports that she does not drink alcohol and does not use drugs.        Occupational History   ??? Not on file        OBJECTIVE:     PHYSICAL EXAM:  Vitals: Temp 36.3 ??C (97.4 ??F) (Skin)  - Wt (!) 105.2 kg (232 lb)  - Breastfeeding No  - BMI 34.26 kg/m??   Appearance: well-nourished and no acute distress   Skin: No cyanosis or clubbing in bilat hands. and No edema in the feet.  Pulses: 2+ DP pulses bilaterally     Neurologic exam:   Mental Status: Nil Bolser is oriented to time, place and person & is alert and cooperative  Motor: Normal bulk and tone without evidence of pronator drift or fasciculations. No abnormal movements.  Gait: antalgic, slow.    Cerebellum/Coordination: Deferred    Motor R L  Reflexes R L   IP 5 4 (pain)  Patellar absent absent   Quad 5 4 (pain)  Achilles absent absent       Pathologic R L   TA 5 5  Hoffmann's Deferred Deferred   EHL 5 5  Clonus neg. neg.   GS 5 5  Babinski neg. neg.     Sensory: Sensation to light touch intact throughout.    Thoracolumbar Spine Exam:  Inspection: No swelling, erythema, deformity, atrophy or hypertrophy noted  Lumbar Spine ROM: moderately restricted, painful flexion/extension  Spine Palpation: normal skin, diffusely painful to palpation L4-S1 midline and left paraspinal region    Facet Loading Test: Positive Left  Nerve Tension Signs: Slump test positive left    Sacroiliac Joint:   Inspection: Normal alignment. No erythema, discoloration, or asymmetry.  Palpation: Pain to palpation at Left PSIS    Right Hip/Knee:   No  pain with hip internal rotation/loading. Normal internal rotation ROM.   No knee pain with flexion/extension. Normal ROM and stability.  Left Hip/Knee:   Normal internal rotation ROM. Groin/hip pain with internal rotation/loading.   No knee pain with flexion/extension. Normal ROM and stability.       MEDICAL DECISION MAKING    Test Results:  Imaging:   L-spine MRI. Elizabeth Browning. Date:Today.  Impression: Ankylosis L3 and L4.  Grade 1 anterolisthesis L4 and L5 with moderate canal stenosis.  Severe foraminal stenosis L4-L5 and L5-S1, L>R.  Bilateral facet arthropathy most pronounced at L4-L5.    L-spine XR. [place]. Date:08/2021.  Impression: Ankylosis L3-L4.  Grade 1 anterolisthesis L4 on L5.  Advanced DDD and facet artrhopathy L4-S1.    MRI R Hip. [place]. Date:08/2021.  Impression: probable lateral left acetabular labral tear.      I, Duke Salvia, NP, personally interpreted the images. Images were reviewed with the patient on the PACS monitor. The available reports were reviewed.    Labs:  Lab Results   Component Value Date    A1C 6.0 (H) 08/18/2017       Discussion:  ?? Clinical findings, diagnostic/treatment options, and plan were discussed with the patient.  ?? Activities - Advised activities as tolerated, using pain as a guide.  ?? Conservative Care - Options discussed.  ?? Imaging - Discussed pros/cons of advanced imaging options.  ?? Epidural Steroid Injections - Discussed risks/benefits, symptom management and diagnostic value.  ?? Medications - risks/benefits of methocarbomal, ativan were discussed.  ?? Surgery - Discussed natural history, pros/cons and risks/benefits of surgery, surgical options, and risks/benefits of alternatives.      E&M Coding:    MEDICAL DECISION MAKING (level of service defined by 2/3 elements)     Number/Complexity of Problems Addressed 1 or more chronic illnesses with severe exacerbation, progression, or side effects of treatment (99205/99215)   Amount/Complexity of Data to be Reviewed/Analyzed Meets at least 2 of the 3 MODERATE criteria above (99205/99215)   Risk of Complications/Morbidity/Mortality of Management Prescription Medication (99204/99214)     Or TIME     Total Time for E/M Services on the Date of Encounter N/A          cc: Peyton Najjar, PA, Lilian Kapur, PA

## 2021-10-08 NOTE — Unmapped (Signed)
Specialty Medication(s): Simponi    Elizabeth Browning has been dis-enrolled from the [facility] specialty pharmacy services due to a change in therapy. The patient is now taking Avsola (infliximab infusions) and is not filling at the [facility].    Additional information provided to the patient: na    Estil Vallee A Desiree Lucy [facility] Specialty Pharmacist

## 2021-10-11 MED ORDER — OXYCODONE 5 MG TABLET
ORAL_TABLET | ORAL | 0 refills | 2 days | Status: CP | PRN
Start: 2021-10-11 — End: ?

## 2021-10-16 DIAGNOSIS — M5416 Radiculopathy, lumbar region: Principal | ICD-10-CM

## 2021-10-16 DIAGNOSIS — L732 Hidradenitis suppurativa: Principal | ICD-10-CM

## 2021-10-16 MED ORDER — OXYCODONE-ACETAMINOPHEN 10 MG-325 MG TABLET
ORAL_TABLET | ORAL | 0 refills | 4 days | PRN
Start: 2021-10-16 — End: ?

## 2021-10-17 ENCOUNTER — Ambulatory Visit: Admit: 2021-10-17 | Discharge: 2021-10-17 | Payer: MEDICAID

## 2021-10-17 MED ORDER — OXYCODONE-ACETAMINOPHEN 10 MG-325 MG TABLET
ORAL_TABLET | ORAL | 0 refills | 4 days | PRN
Start: 2021-10-17 — End: ?

## 2021-10-18 DIAGNOSIS — L732 Hidradenitis suppurativa: Principal | ICD-10-CM

## 2021-10-18 MED ORDER — OXYCODONE-ACETAMINOPHEN 10 MG-325 MG TABLET
ORAL_TABLET | Freq: Four times a day (QID) | ORAL | 0 refills | 5 days | Status: CP | PRN
Start: 2021-10-18 — End: ?

## 2021-10-23 ENCOUNTER — Ambulatory Visit: Admit: 2021-10-23 | Discharge: 2021-10-24 | Payer: MEDICAID

## 2021-11-07 ENCOUNTER — Ambulatory Visit: Admit: 2021-11-07 | Discharge: 2021-11-08 | Payer: MEDICAID

## 2021-11-16 DIAGNOSIS — L732 Hidradenitis suppurativa: Principal | ICD-10-CM

## 2021-11-22 ENCOUNTER — Ambulatory Visit: Admit: 2021-11-22 | Payer: MEDICAID

## 2021-11-27 ENCOUNTER — Ambulatory Visit: Admit: 2021-11-27 | Discharge: 2021-11-27 | Payer: MEDICAID

## 2021-11-27 DIAGNOSIS — M5416 Radiculopathy, lumbar region: Principal | ICD-10-CM

## 2021-12-05 DIAGNOSIS — M5416 Radiculopathy, lumbar region: Principal | ICD-10-CM

## 2021-12-05 MED ORDER — MUPIROCIN 2 % TOPICAL OINTMENT
Freq: Two times a day (BID) | TOPICAL | 0 refills | 3 days | Status: CP
Start: 2021-12-05 — End: 2021-12-08

## 2021-12-06 DIAGNOSIS — M5416 Radiculopathy, lumbar region: Principal | ICD-10-CM

## 2021-12-06 DIAGNOSIS — M5116 Intervertebral disc disorders with radiculopathy, lumbar region: Principal | ICD-10-CM

## 2021-12-06 DIAGNOSIS — M5126 Other intervertebral disc displacement, lumbar region: Principal | ICD-10-CM

## 2021-12-06 DIAGNOSIS — M48061 Spinal stenosis, lumbar region without neurogenic claudication: Principal | ICD-10-CM

## 2021-12-06 DIAGNOSIS — M4316 Spondylolisthesis, lumbar region: Principal | ICD-10-CM

## 2021-12-06 DIAGNOSIS — Q762 Congenital spondylolisthesis: Principal | ICD-10-CM

## 2021-12-13 ENCOUNTER — Encounter: Admit: 2021-12-13 | Discharge: 2021-12-13 | Payer: MEDICAID

## 2021-12-13 ENCOUNTER — Ambulatory Visit: Admit: 2021-12-13 | Discharge: 2021-12-13 | Payer: MEDICAID

## 2021-12-13 DIAGNOSIS — M4316 Spondylolisthesis, lumbar region: Principal | ICD-10-CM

## 2021-12-13 DIAGNOSIS — L732 Hidradenitis suppurativa: Principal | ICD-10-CM

## 2021-12-13 DIAGNOSIS — Q762 Congenital spondylolisthesis: Principal | ICD-10-CM

## 2021-12-13 DIAGNOSIS — M5126 Other intervertebral disc displacement, lumbar region: Principal | ICD-10-CM

## 2021-12-13 DIAGNOSIS — M4326 Fusion of spine, lumbar region: Principal | ICD-10-CM

## 2021-12-13 DIAGNOSIS — M48061 Spinal stenosis, lumbar region without neurogenic claudication: Principal | ICD-10-CM

## 2021-12-13 DIAGNOSIS — M5416 Radiculopathy, lumbar region: Principal | ICD-10-CM

## 2021-12-13 DIAGNOSIS — Z01818 Encounter for other preprocedural examination: Principal | ICD-10-CM

## 2021-12-13 DIAGNOSIS — M5116 Intervertebral disc disorders with radiculopathy, lumbar region: Principal | ICD-10-CM

## 2021-12-24 ENCOUNTER — Ambulatory Visit: Admit: 2021-12-24 | Discharge: 2021-12-25 | Payer: MEDICAID

## 2021-12-26 MED ORDER — CALCIUM CARBONATE 300 MG (750 MG) CHEWABLE TABLET
ORAL_TABLET | Freq: Two times a day (BID) | ORAL | 0 refills | 30.00000 days
Start: 2021-12-26 — End: 2022-01-25

## 2021-12-26 MED ORDER — TRAMADOL 50 MG TABLET
ORAL_TABLET | Freq: Four times a day (QID) | ORAL | 0 refills | 5.00000 days | PRN
Start: 2021-12-26 — End: 2021-12-31

## 2021-12-26 MED ORDER — ENOXAPARIN 40 MG/0.4 ML SUBCUTANEOUS SYRINGE
Freq: Two times a day (BID) | SUBCUTANEOUS | 0 refills | 28.00000 days
Start: 2021-12-26 — End: 2022-01-23

## 2021-12-26 MED ORDER — OXYCODONE 10 MG TABLET
ORAL_TABLET | ORAL | 0 refills | 7.00000 days | PRN
Start: 2021-12-26 — End: 2021-12-31

## 2021-12-26 MED ORDER — POLYETHYLENE GLYCOL 3350 17 GRAM ORAL POWDER PACKET
PACK | Freq: Every day | ORAL | 0 refills | 7.00000 days
Start: 2021-12-26 — End: 2022-01-02

## 2021-12-26 MED ORDER — METHOCARBAMOL 750 MG TABLET
ORAL_TABLET | Freq: Four times a day (QID) | ORAL | 0 refills | 15.00000 days
Start: 2021-12-26 — End: 2022-01-10

## 2021-12-26 MED ORDER — CHOLECALCIFEROL (VITAMIN D3) 25 MCG (1,000 UNIT) TABLET
ORAL_TABLET | Freq: Every day | ORAL | 0 refills | 90.00000 days
Start: 2021-12-26 — End: 2022-03-26

## 2021-12-26 MED ORDER — SENNOSIDES 8.6 MG TABLET
ORAL_TABLET | Freq: Every evening | ORAL | 0 refills | 10.00000 days | PRN
Start: 2021-12-26 — End: 2022-01-25

## 2021-12-26 MED ORDER — ACETAMINOPHEN 500 MG TABLET
ORAL_TABLET | Freq: Three times a day (TID) | ORAL | 0 refills | 13.00000 days | PRN
Start: 2021-12-26 — End: 2022-01-16

## 2021-12-26 MED ORDER — PREGABALIN 25 MG CAPSULE
ORAL_CAPSULE | Freq: Three times a day (TID) | ORAL | 0 refills | 14.00000 days
Start: 2021-12-26 — End: 2022-01-09

## 2022-01-02 ENCOUNTER — Ambulatory Visit: Admit: 2022-01-02 | Discharge: 2022-01-05 | Disposition: A | Discharge status: Home / Self Care | Payer: MEDICAID

## 2022-01-02 ENCOUNTER — Encounter
Admit: 2022-01-02 | Discharge: 2022-01-05 | Disposition: A | Discharge status: Home / Self Care | Payer: MEDICAID | Attending: Critical Care Medicine

## 2022-01-02 ENCOUNTER — Encounter
Admit: 2022-01-02 | Discharge: 2022-01-05 | Disposition: A | Discharge status: Home / Self Care | Payer: MEDICAID | Attending: Student in an Organized Health Care Education/Training Program

## 2022-01-02 ENCOUNTER — Encounter: Admit: 2022-01-02 | Discharge: 2022-01-05 | Disposition: A | Discharge status: Home / Self Care | Payer: MEDICAID

## 2022-01-03 MED ORDER — ACETAMINOPHEN 500 MG TABLET
ORAL_TABLET | Freq: Three times a day (TID) | ORAL | 0 refills | 13 days | PRN
Start: 2022-01-03 — End: 2022-01-24

## 2022-01-03 MED ORDER — OXYCODONE 10 MG TABLET
ORAL_TABLET | ORAL | 0 refills | 7 days | PRN
Start: 2022-01-03 — End: 2022-01-08

## 2022-01-03 MED ORDER — CHOLECALCIFEROL (VITAMIN D3) 25 MCG (1,000 UNIT) TABLET
ORAL_TABLET | Freq: Every day | ORAL | 0 refills | 90 days
Start: 2022-01-03 — End: 2022-04-03

## 2022-01-03 MED ORDER — ENOXAPARIN 40 MG/0.4 ML SUBCUTANEOUS SYRINGE
Freq: Two times a day (BID) | SUBCUTANEOUS | 0 refills | 28 days
Start: 2022-01-03 — End: 2022-01-31

## 2022-01-03 MED ORDER — TRAMADOL 50 MG TABLET
ORAL_TABLET | Freq: Four times a day (QID) | ORAL | 0 refills | 5 days | PRN
Start: 2022-01-03 — End: 2022-01-08

## 2022-01-03 MED ORDER — POLYETHYLENE GLYCOL 3350 17 GRAM ORAL POWDER PACKET
PACK | Freq: Every day | ORAL | 0 refills | 7 days
Start: 2022-01-03 — End: 2022-01-10

## 2022-01-03 MED ORDER — METHOCARBAMOL 750 MG TABLET
ORAL_TABLET | Freq: Four times a day (QID) | ORAL | 0 refills | 15 days
Start: 2022-01-03 — End: 2022-01-18

## 2022-01-03 MED ORDER — PREGABALIN 25 MG CAPSULE
ORAL_CAPSULE | Freq: Three times a day (TID) | ORAL | 0 refills | 14 days
Start: 2022-01-03 — End: 2022-01-17

## 2022-01-03 MED ORDER — CALCIUM CARBONATE 300 MG (750 MG) CHEWABLE TABLET
ORAL_TABLET | Freq: Two times a day (BID) | ORAL | 0 refills | 30 days
Start: 2022-01-03 — End: 2022-02-02

## 2022-01-03 MED ORDER — SENNOSIDES 8.6 MG TABLET
ORAL_TABLET | Freq: Every evening | ORAL | 0 refills | 10 days | PRN
Start: 2022-01-03 — End: 2022-02-02

## 2022-01-05 MED ORDER — METHOCARBAMOL 750 MG TABLET
ORAL_TABLET | Freq: Four times a day (QID) | ORAL | 0 refills | 15 days | Status: CP
Start: 2022-01-05 — End: 2022-01-20
  Filled 2022-01-05: qty 60, 15d supply, fill #0

## 2022-01-05 MED ORDER — POLYETHYLENE GLYCOL 3350 17 GRAM ORAL POWDER PACKET
PACK | Freq: Every day | ORAL | 0 refills | 7 days | Status: CP
Start: 2022-01-05 — End: 2022-01-12
  Filled 2022-01-05: qty 7, 7d supply, fill #0

## 2022-01-05 MED ORDER — ENOXAPARIN 40 MG/0.4 ML SUBCUTANEOUS SYRINGE
SUBCUTANEOUS | 0 refills | 28 days | Status: CP
Start: 2022-01-05 — End: 2022-02-02
  Filled 2022-01-05: qty 11.2, 28d supply, fill #0

## 2022-01-05 MED ORDER — SENNOSIDES 8.6 MG TABLET
ORAL_TABLET | Freq: Every evening | ORAL | 0 refills | 10 days | Status: CP | PRN
Start: 2022-01-05 — End: 2022-02-04
  Filled 2022-01-05: qty 20, 10d supply, fill #0

## 2022-01-05 MED ORDER — TRAMADOL 50 MG TABLET
ORAL_TABLET | Freq: Four times a day (QID) | ORAL | 0 refills | 5 days | Status: CP | PRN
Start: 2022-01-05 — End: 2022-01-10

## 2022-01-05 MED ORDER — OXYCODONE 10 MG TABLET
ORAL_TABLET | ORAL | 0 refills | 7 days | Status: CP | PRN
Start: 2022-01-05 — End: 2022-01-12
  Filled 2022-01-05: qty 40, 7d supply, fill #0

## 2022-01-05 MED ORDER — CALCIUM CARBONATE 300 MG (750 MG) CHEWABLE TABLET
ORAL_TABLET | Freq: Two times a day (BID) | ORAL | 0 refills | 48 days | Status: CP
Start: 2022-01-05 — End: 2022-02-22
  Filled 2022-01-05: qty 96, 48d supply, fill #0

## 2022-01-05 MED ORDER — ACETAMINOPHEN 500 MG TABLET
ORAL_TABLET | Freq: Three times a day (TID) | ORAL | 0 refills | 13 days | Status: CP | PRN
Start: 2022-01-05 — End: 2022-01-26
  Filled 2022-01-05: qty 75, 13d supply, fill #0

## 2022-01-05 MED ORDER — PREGABALIN 25 MG CAPSULE
ORAL_CAPSULE | Freq: Three times a day (TID) | ORAL | 0 refills | 14 days | Status: CP
Start: 2022-01-05 — End: 2022-01-19
  Filled 2022-01-05: qty 42, 14d supply, fill #0

## 2022-01-05 MED ORDER — CHOLECALCIFEROL (VITAMIN D3) 25 MCG (1,000 UNIT) TABLET
ORAL_TABLET | Freq: Every day | ORAL | 0 refills | 100 days | Status: CP
Start: 2022-01-05 — End: 2022-04-15
  Filled 2022-01-05: qty 100, 100d supply, fill #0

## 2022-01-07 MED ORDER — OXYCODONE-ACETAMINOPHEN 10 MG-325 MG TABLET
ORAL_TABLET | ORAL | 0 refills | 2 days | Status: CP | PRN
Start: 2022-01-07 — End: ?

## 2022-01-14 MED ORDER — PREGABALIN 25 MG CAPSULE
ORAL_CAPSULE | Freq: Three times a day (TID) | ORAL | 0 refills | 14 days | Status: CP
Start: 2022-01-14 — End: 2022-01-28

## 2022-01-14 MED ORDER — OXYCODONE-ACETAMINOPHEN 10 MG-325 MG TABLET
ORAL_TABLET | ORAL | 0 refills | 5 days | Status: CP | PRN
Start: 2022-01-14 — End: ?

## 2022-01-16 DIAGNOSIS — M5416 Radiculopathy, lumbar region: Principal | ICD-10-CM

## 2022-01-18 MED ORDER — OXYCODONE 5 MG TABLET
ORAL_TABLET | ORAL | 0 refills | 7 days | Status: CP | PRN
Start: 2022-01-18 — End: 2022-01-25

## 2022-01-22 MED ORDER — OXYCODONE-ACETAMINOPHEN 10 MG-325 MG TABLET
ORAL_TABLET | ORAL | 0 refills | 5 days | Status: CP | PRN
Start: 2022-01-22 — End: ?

## 2022-01-25 MED ORDER — OXYCODONE-ACETAMINOPHEN 10 MG-325 MG TABLET
ORAL_TABLET | ORAL | 0 refills | 7 days | Status: CP | PRN
Start: 2022-01-25 — End: ?

## 2022-02-01 MED ORDER — OXYCODONE-ACETAMINOPHEN 10 MG-325 MG TABLET
ORAL_TABLET | ORAL | 0 refills | 7 days | Status: CP | PRN
Start: 2022-02-01 — End: ?

## 2022-02-06 ENCOUNTER — Ambulatory Visit: Admit: 2022-02-06 | Discharge: 2022-02-07 | Payer: MEDICAID

## 2022-02-06 DIAGNOSIS — M5416 Radiculopathy, lumbar region: Principal | ICD-10-CM

## 2022-02-06 MED ORDER — OXYCODONE-ACETAMINOPHEN 10 MG-325 MG TABLET
ORAL_TABLET | ORAL | 0 refills | 7 days | Status: CP | PRN
Start: 2022-02-06 — End: ?

## 2022-02-13 DIAGNOSIS — L732 Hidradenitis suppurativa: Principal | ICD-10-CM

## 2022-02-19 ENCOUNTER — Ambulatory Visit: Admit: 2022-02-19 | Discharge: 2022-02-20 | Payer: MEDICAID

## 2022-04-25 MED ORDER — PREGABALIN 25 MG CAPSULE
ORAL_CAPSULE | 0 refills | 0 days
Start: 2022-04-25 — End: ?

## 2022-04-26 ENCOUNTER — Ambulatory Visit: Admit: 2022-04-26 | Payer: MEDICAID

## 2022-05-09 ENCOUNTER — Ambulatory Visit: Admit: 2022-05-09 | Discharge: 2022-05-09 | Payer: MEDICAID

## 2022-05-09 DIAGNOSIS — M5416 Radiculopathy, lumbar region: Principal | ICD-10-CM

## 2022-05-22 ENCOUNTER — Ambulatory Visit
Admit: 2022-05-22 | Discharge: 2022-05-23 | Payer: MEDICAID | Attending: Student in an Organized Health Care Education/Training Program | Primary: Student in an Organized Health Care Education/Training Program

## 2022-05-22 DIAGNOSIS — Z79899 Other long term (current) drug therapy: Principal | ICD-10-CM

## 2022-05-22 DIAGNOSIS — L732 Hidradenitis suppurativa: Principal | ICD-10-CM

## 2022-05-22 MED ORDER — OXYCODONE-ACETAMINOPHEN 10 MG-325 MG TABLET
ORAL_TABLET | Freq: Four times a day (QID) | ORAL | 0 refills | 5.00000 days | Status: CP | PRN
Start: 2022-05-22 — End: 2022-05-22

## 2022-05-22 MED ORDER — COSENTYX PEN 300 MG/2 PENS (150 MG/ML) SUBCUTANEOUS
SUBCUTANEOUS | 7 refills | 0.00000 days | Status: CP
Start: 2022-05-22 — End: ?

## 2022-05-23 DIAGNOSIS — L732 Hidradenitis suppurativa: Principal | ICD-10-CM

## 2022-06-12 ENCOUNTER — Ambulatory Visit: Admit: 2022-06-12 | Discharge: 2022-06-13 | Payer: MEDICAID

## 2022-06-14 DIAGNOSIS — L732 Hidradenitis suppurativa: Principal | ICD-10-CM

## 2022-07-24 DIAGNOSIS — L732 Hidradenitis suppurativa: Principal | ICD-10-CM

## 2022-07-24 MED ORDER — PREGABALIN 25 MG CAPSULE
ORAL_CAPSULE | 0 refills | 0 days
Start: 2022-07-24 — End: ?

## 2022-08-30 ENCOUNTER — Ambulatory Visit: Admit: 2022-08-30 | Discharge: 2022-08-31 | Payer: MEDICAID

## 2022-09-10 ENCOUNTER — Ambulatory Visit: Admit: 2022-09-10 | Discharge: 2022-09-10 | Payer: MEDICAID

## 2022-09-10 DIAGNOSIS — M5416 Radiculopathy, lumbar region: Principal | ICD-10-CM

## 2022-09-11 ENCOUNTER — Ambulatory Visit
Admit: 2022-09-11 | Discharge: 2022-09-12 | Payer: MEDICAID | Attending: Student in an Organized Health Care Education/Training Program | Primary: Student in an Organized Health Care Education/Training Program

## 2022-09-11 DIAGNOSIS — L732 Hidradenitis suppurativa: Principal | ICD-10-CM

## 2022-09-11 MED ORDER — OXYCODONE-ACETAMINOPHEN 10 MG-325 MG TABLET
ORAL_TABLET | Freq: Four times a day (QID) | ORAL | 0 refills | 5.00000 days | Status: CP | PRN
Start: 2022-09-11 — End: ?

## 2022-09-11 MED ORDER — FLUCONAZOLE 150 MG TABLET
ORAL_TABLET | ORAL | 0 refills | 84.00000 days | Status: CP
Start: 2022-09-11 — End: ?

## 2022-09-11 MED ORDER — CEFDINIR 300 MG CAPSULE
ORAL_CAPSULE | ORAL | 3 refills | 0.00000 days | Status: CP
Start: 2022-09-11 — End: ?

## 2022-09-22 DIAGNOSIS — L732 Hidradenitis suppurativa: Principal | ICD-10-CM

## 2022-09-23 DIAGNOSIS — M5416 Radiculopathy, lumbar region: Principal | ICD-10-CM

## 2022-09-28 DIAGNOSIS — M5416 Radiculopathy, lumbar region: Principal | ICD-10-CM

## 2022-09-30 MED ORDER — OXYCODONE-ACETAMINOPHEN 10 MG-325 MG TABLET
ORAL_TABLET | Freq: Four times a day (QID) | ORAL | 0 refills | 5.00000 days | PRN
Start: 2022-09-30 — End: ?

## 2022-10-01 ENCOUNTER — Ambulatory Visit: Admit: 2022-10-01 | Discharge: 2022-10-02 | Payer: MEDICAID

## 2022-10-01 DIAGNOSIS — M5416 Radiculopathy, lumbar region: Principal | ICD-10-CM

## 2022-10-10 ENCOUNTER — Ambulatory Visit: Admit: 2022-10-10 | Discharge: 2022-10-11 | Payer: MEDICAID

## 2022-10-10 DIAGNOSIS — L732 Hidradenitis suppurativa: Principal | ICD-10-CM

## 2022-10-19 DIAGNOSIS — L732 Hidradenitis suppurativa: Principal | ICD-10-CM

## 2022-10-22 ENCOUNTER — Ambulatory Visit: Admit: 2022-10-22 | Discharge: 2022-10-23 | Payer: MEDICAID

## 2022-10-30 ENCOUNTER — Ambulatory Visit
Admit: 2022-10-30 | Discharge: 2022-10-31 | Payer: PRIVATE HEALTH INSURANCE | Attending: Student in an Organized Health Care Education/Training Program | Primary: Student in an Organized Health Care Education/Training Program

## 2022-10-30 DIAGNOSIS — L732 Hidradenitis suppurativa: Principal | ICD-10-CM

## 2022-10-30 DIAGNOSIS — Z79899 Other long term (current) drug therapy: Principal | ICD-10-CM

## 2022-10-30 MED ORDER — OXYCODONE-ACETAMINOPHEN 10 MG-325 MG TABLET
ORAL_TABLET | Freq: Four times a day (QID) | ORAL | 0 refills | 8.00000 days | Status: CP | PRN
Start: 2022-10-30 — End: ?

## 2022-11-14 DIAGNOSIS — L732 Hidradenitis suppurativa: Principal | ICD-10-CM

## 2022-12-12 DIAGNOSIS — L732 Hidradenitis suppurativa: Principal | ICD-10-CM

## 2022-12-13 DIAGNOSIS — L732 Hidradenitis suppurativa: Principal | ICD-10-CM

## 2022-12-30 ENCOUNTER — Ambulatory Visit
Admit: 2022-12-30 | Discharge: 2022-12-31 | Payer: MEDICARE | Attending: Student in an Organized Health Care Education/Training Program | Primary: Student in an Organized Health Care Education/Training Program

## 2022-12-30 DIAGNOSIS — L732 Hidradenitis suppurativa: Principal | ICD-10-CM

## 2022-12-30 DIAGNOSIS — Z79899 Other long term (current) drug therapy: Principal | ICD-10-CM

## 2022-12-30 DIAGNOSIS — G8929 Other chronic pain: Principal | ICD-10-CM

## 2022-12-30 MED ORDER — AMOXICILLIN 875 MG-POTASSIUM CLAVULANATE 125 MG TABLET
ORAL_TABLET | Freq: Two times a day (BID) | ORAL | 2 refills | 15.00000 days | Status: CP
Start: 2022-12-30 — End: ?

## 2022-12-31 DIAGNOSIS — L732 Hidradenitis suppurativa: Principal | ICD-10-CM

## 2022-12-31 MED ORDER — CEFDINIR 300 MG CAPSULE
ORAL_CAPSULE | ORAL | 2 refills | 0.00000 days | Status: CP
Start: 2022-12-31 — End: ?

## 2023-01-01 ENCOUNTER — Ambulatory Visit: Admit: 2023-01-01 | Discharge: 2023-01-02 | Payer: MEDICARE

## 2023-01-08 DIAGNOSIS — L732 Hidradenitis suppurativa: Principal | ICD-10-CM

## 2023-01-28 ENCOUNTER — Ambulatory Visit: Admit: 2023-01-28 | Discharge: 2023-01-29 | Payer: PRIVATE HEALTH INSURANCE

## 2023-01-30 MED ORDER — OXYCODONE-ACETAMINOPHEN 10 MG-325 MG TABLET
ORAL_TABLET | Freq: Four times a day (QID) | ORAL | 0 refills | 8.00000 days | PRN
Start: 2023-01-30 — End: ?

## 2023-02-07 DIAGNOSIS — L732 Hidradenitis suppurativa: Principal | ICD-10-CM

## 2023-02-07 MED ORDER — OXYCODONE-ACETAMINOPHEN 10 MG-325 MG TABLET
ORAL_TABLET | Freq: Four times a day (QID) | ORAL | 0 refills | 5.00000 days | Status: CP | PRN
Start: 2023-02-07 — End: ?

## 2023-02-20 DIAGNOSIS — L732 Hidradenitis suppurativa: Principal | ICD-10-CM

## 2023-02-25 ENCOUNTER — Ambulatory Visit: Admit: 2023-02-25 | Discharge: 2023-02-26 | Payer: PRIVATE HEALTH INSURANCE

## 2023-03-06 ENCOUNTER — Ambulatory Visit: Admit: 2023-03-06 | Discharge: 2023-03-06 | Payer: PRIVATE HEALTH INSURANCE

## 2023-03-06 DIAGNOSIS — M4326 Fusion of spine, lumbar region: Principal | ICD-10-CM

## 2023-03-20 DIAGNOSIS — L732 Hidradenitis suppurativa: Principal | ICD-10-CM

## 2023-03-24 DIAGNOSIS — T8090XD Unspecified complication following infusion and therapeutic injection, subsequent encounter: Principal | ICD-10-CM

## 2023-03-25 ENCOUNTER — Ambulatory Visit: Admit: 2023-03-25 | Discharge: 2023-03-26 | Payer: PRIVATE HEALTH INSURANCE

## 2023-03-25 DIAGNOSIS — T8090XD Unspecified complication following infusion and therapeutic injection, subsequent encounter: Principal | ICD-10-CM

## 2023-03-25 DIAGNOSIS — L732 Hidradenitis suppurativa: Principal | ICD-10-CM

## 2023-03-31 ENCOUNTER — Ambulatory Visit
Admit: 2023-03-31 | Discharge: 2023-04-01 | Payer: PRIVATE HEALTH INSURANCE | Attending: Student in an Organized Health Care Education/Training Program | Primary: Student in an Organized Health Care Education/Training Program

## 2023-03-31 MED ORDER — FLUCONAZOLE 150 MG TABLET
ORAL_TABLET | 0 refills | 0.00000 days | Status: CP
Start: 2023-03-31 — End: ?

## 2023-03-31 MED ORDER — OXYCODONE-ACETAMINOPHEN 10 MG-325 MG TABLET
ORAL_TABLET | Freq: Four times a day (QID) | ORAL | 0 refills | 5.00000 days | Status: CP | PRN
Start: 2023-03-31 — End: 2023-04-30

## 2023-04-04 DIAGNOSIS — L732 Hidradenitis suppurativa: Principal | ICD-10-CM

## 2023-04-04 MED ORDER — COSENTYX UNOREADY PEN 300 MG/2 ML (150 MG/ML) SUBCUTANEOUS
SUBCUTANEOUS | 3 refills | 0.00000 days | Status: CP
Start: 2023-04-04 — End: ?
  Filled 2023-04-14: qty 4, 28d supply, fill #0

## 2023-04-07 DIAGNOSIS — L732 Hidradenitis suppurativa: Principal | ICD-10-CM

## 2023-04-10 NOTE — Unmapped (Signed)
[facility] Specialty Medication Onboarding    Specialty Medication: COSENTYX UNOREADY PEN 300 mg/2 mL (150 mg/mL) Pnij (secukinumab)  Prior Authorization: Approved   Financial Assistance: No - copay  <$25  Final Copay/Day Supply: $4 / 84 days    Insurance Restrictions: None     Notes to Pharmacist:   Credit Card on File: no    The triage team has completed the benefits investigation and has determined that the patient is able to fill this medication at [facility]. Please contact the patient to complete the onboarding or follow up with the prescribing physician as needed.

## 2023-04-10 NOTE — Unmapped (Unsigned)
- previously receiving medication through mfg assist. Now with Medicaid approval.   - dose escalation to 300 mg every 14 days.    - Previously using [employer] - switch to [employer].     [facility]    Patient Onboarding/Medication Counseling    Elizabeth Browning is a 57 y.o. female with hidradenitis suppurativa who I am counseling today on  continuation, dose escalation and new device  of therapy.  I am speaking to the patient.    Was a Nurse, learning disability used for this call? No    Verified patient's date of birth / HIPAA.    Specialty medication(s) to be sent: Inflammatory Disorders: Cosentyx      Non-specialty medications/supplies to be sent: sharps kit      Medications not needed at this time: na         Cosentyx (secukinumab)    Medication & Administration     Dosage: Hidradenitis Suppurativa: Inject 300mg  under the skin at weeks 0, 1, 2, 3, and 4 followed by 300 mg every 4 weeks; consider an increase to 300 mg every 2 weeks in patients who have an inadequate response (300 mg every 14 days now- escalation due to failure at lower dose)      Lab tests required prior to treatment initiation:  Tuberculosis: Tuberculosis screening resulted in a non-reactive Quantiferon TB Gold assay. (12/24/21)      Administration:     Prefilled Unoready?? auto-injector pen  Gather all supplies needed for injection on a clean, flat working surface: medication pen removed from packaging, alcohol swab, sharps container, etc.  Look at the medication label - look for correct medication, correct dose, and check the expiration date  Look at the medication - the liquid visible in the window on the side of the pen device should appear clear and colorless to slightly yellow  Lay the auto-injector pen on a flat surface and allow it to warm up to room temperature for at least  30-45 minutes  Select injection site - you can use the front of your thigh or your belly (but not the area 2 inches around your belly button); if someone else is giving you the injection you can also use your upper arm in the skin covering your triceps muscle  Prepare injection site - wash your hands and clean the skin at the injection site with an alcohol swab and let it air dry, do not touch the injection site again before the injection  Pull off the purple safety cap in the direction of the arrow, do not remove until immediately prior to injection and do not touch the red needle cover  Put the red needle cover against your skin at the injection site at a 90 degree angle, hold the pen such that you can see the clear medication window  Press down and hold the pen firmly against your skin, there will be a click when the injection starts  Continue to hold the pen firmly against your skin for about 10-15 seconds - the window will start to turn solid green  There will be a second click sound when the injection is almost complete, verify the window is solid green to indicate the injection is complete and then pull the pen away from your skin  Dispose of the used auto-injector pen immediately in your sharps disposal container the needle will be covered automatically  If you see any blood at the injection site, press a cotton ball or gauze on  the site and maintain pressure until the bleeding stops, do not rub the injection site      Adherence/Missed dose instructions:  If your injection is given more than 4 days after your scheduled injection date - consult your pharmacist for additional instructions on how to adjust your dosing schedule.        Goals of Therapy     Ankylosing spondylitis  Relief of symptoms  Maintenance of function  Prevention of complications of spinal disease  Minimization of extraspinal and extraarticular manifestations and comorbidities  Maintenance of effective psychosocial functioning    Plaque Psoriasis  Minimize areas of skin involvement (% BSA)  Avoidance of long term glucocorticoid use  Maintenance of effective psychosocial functioning    Hidradenitis Suppurativa  Reduce the frequency and severity of new lesions  Minimize pain and suppuration  Prevent disease progression and limit scarring  Maintenance of effective psychosocial functioning    Psoriatic arthritis  Achieve remission/inactive disease or low/minimal disease activity  Maintenance of function  Minimization of systemic manifestations and comorbidities  Maintenance of effective psychosocial functioning      Side Effects & Monitoring Parameters     Injection site reaction (redness, irritation, inflammation localized to the site of administration)  Signs of a common cold - minor sore throat, runny or stuffy nose, etc.  Diarrhea    The following side effects should be reported to the provider:  Signs of a hypersensitivity reaction - rash; hives; itching; red, swollen, blistered, or peeling skin; wheezing; tightness in the chest or throat; difficulty breathing, swallowing, or talking; swelling of the mouth, face, lips, tongue, or throat; etc.  Reduced immune function - report signs of infection such as fever; chills; body aches; very bad sore throat; ear or sinus pain; cough; more sputum or change in color of sputum; pain with passing urine; wound that will not heal, etc.  Also at a slightly higher risk of some malignancies (mainly skin and blood cancers) due to this reduced immune function.  In the case of signs of infection - the patient should hold the next dose of Cosentyx?? and call your primary care provider to ensure adequate medical care.  Treatment may be resumed when infection is treated and patient is asymptomatic.  Muscle pain or weakness  Shortness of breath      Warnings, Precautions, & Contraindications     Have your bloodwork checked as you have been told by your prescriber  Talk with your doctor if you are pregnant, planning to become pregnant, or breastfeeding  Discuss the possible need for holding your dose(s) of Cosentyx?? when a planned procedure is scheduled with the prescriber as it may delay healing/recovery timeline       Drug/Food Interactions     Medication list reviewed in Epic. The patient was instructed to inform the care team before taking any new medications or supplements. No drug interactions identified.   If you have a latex allergy use caution when handling, the needle cap of the Cosentyx?? prefilled syringe and the safety cap for the Cosentyx Sensoready?? pen contains a derivative of natural rubber latex. Unoready?? pen does NOT contain latex.   Talk with you prescriber or pharmacist before receiving any live vaccinations while taking this medication and after you stop taking it      Storage, Handling Precautions, & Disposal     Store this medication in the refrigerator.  Do not freeze  May store intact Sensoready pens and 150 mg/mL prefilled syringes at <=30??C (<=86??F)  for up to 4 days; may return to the refrigerator if unused  Store in original packaging, protected from light  Do not shake  Dispose of used syringes/pens in a sharps disposal container           Current Medications (including OTC/herbals), Comorbidities and Allergies     Current Outpatient Medications   Medication Sig Dispense Refill    ADVAIR DISKUS 250-50 mcg/dose diskus INHALE 1 PUFF BY MOUTH TWICE DAILY 12 HOURS APART      albuterol HFA 90 mcg/actuation inhaler Inhale 2 puffs every four (4) hours as needed.      amoxicillin-clavulanate (AUGMENTIN) 875-125 mg per tablet Take 1 tablet by mouth two (2) times a day. 30 tablet 2    buprenorphine 7.5 mcg/hour PTWK transdermal patch Place 1 patch on the skin every seven (7) days. (Patient not taking: Reported on 10/01/2022)      cefdinir (OMNICEF) 300 MG capsule TAKE ONE 300 MG CAPSULE  BY MOUTH TWICE DAILY 60 capsule 2    DEXILANT 60 mg capsule every morning.      fluconazole (DIFLUCAN) 150 MG tablet Take 1 tablet (150 mg total) by mouth once a week. When on antibiotic (Patient not taking: Reported on 10/01/2022) 12 tablet 0    fluconazole (DIFLUCAN) 150 MG tablet Take 1 tablet (150 mg total) by mouth once a week. When on antibiotic 12 tablet 0    ipratropium-albuteroL (DUO-NEB) 0.5-2.5 mg/3 mL nebulizer USE 1 VIAL VIA NEBULIZER EVERY 4 TO 6 HOURS AS NEEDED FOR COUGH OR WHEEZING      methocarbamoL (ROBAXIN) 750 MG tablet Take 1 tablet (750 mg total) by mouth Three (3) times a day as needed. 30 tablet 2    montelukast (SINGULAIR) 10 mg tablet Take 1 tablet (10 mg total) by mouth.      mupirocin (BACTROBAN) 2 % ointment Apply topically Two (2) times a day for 3 days. Use Q-tip to apply a small amount to the inside of both nostrils, twice a day, for the 3 days prior to surgery (including AM of surgery). Pinch nose to distribute evenly. 22 g 0    naproxen (NAPROSYN) 500 MG tablet Take 1 tablet (500 mg total) by mouth. (Patient not taking: Reported on 10/01/2022)      omeprazole (PRILOSEC) 20 MG capsule Take 1 capsule (20 mg total) by mouth.      ondansetron (ZOFRAN-ODT) 4 MG disintegrating tablet Take 1 tablet (4 mg total) by mouth every eight (8) hours as needed.      oxyCODONE-acetaminophen (PERCOCET) 10-325 mg per tablet Take 1 tablet by mouth every six (6) hours as needed for pain. (Patient not taking: Reported on 03/06/2023) 30 tablet 0    oxyCODONE-acetaminophen (PERCOCET) 10-325 mg per tablet Take 1 tablet by mouth every six (6) hours as needed for pain. (Patient not taking: Reported on 03/06/2023) 20 tablet 0    oxyCODONE-acetaminophen (PERCOCET) 10-325 mg per tablet Take 1 tablet by mouth every six (6) hours as needed for pain. 20 tablet 0    pantoprazole (PROTONIX) 20 MG tablet Take 2 tablets (40 mg total) by mouth daily. Unable to report if taking      pregabalin (LYRICA) 25 MG capsule Take 1 capsule (25 mg total) by mouth Three (3) times a day for 14 days. (Patient not taking: Reported on 05/09/2022) 42 capsule 0    pregabalin (LYRICA) 50 MG capsule Take 1 capsule (50 mg total) by mouth Three (3) times a day. (Patient not taking: Reported  on 10/01/2022) 90 capsule 2    rosuvastatin (CRESTOR) 10 MG tablet Take 1 tablet (10 mg total) by mouth.      secukinumab (COSENTYX UNOREADY PEN) 300 mg/2 mL (150 mg/mL) PnIj Inject the contents of 1 en (300 mg) under the skin every fourteen (14) days. 12 mL 3     No current facility-administered medications for this visit.       Allergies   Allergen Reactions    Buprenorphine Hcl Hives     Unknown-pt denies    Codeine Rash, Hives and Itching    Infliximab-Axxq Itching    Adhesive Rash    Iodinated Contrast Media Rash    Latex Rash    Morphine Itching    Opioids - Morphine Analogues Itching     Unknown-pt denies         Patient Active Problem List   Diagnosis    Migraine    Vitamin D deficiency    Fatigue    Hepatitis C    Chronic hepatitis C without hepatic coma (CMS-HCC)    Neuropathic pain    Foot pain    Colon polyp    Anxiety    Asthma    Chronic bronchitis (CMS-HCC)    DOE (dyspnea on exertion)    Loosening of knee joint prosthesis (CMS-HCC)    Acute pain of left knee    Drug-induced constipation    Tobacco abuse    Hidradenitis suppurativa    Hidradenitis    Infusion reaction       Reviewed and up to date in Epic.    Appropriateness of Therapy     Acute infections noted within Epic:  No active infections  Patient reported infection: None    Is the medication and dose appropriate based on diagnosis, medication list, comorbidities, allergies, medical history, patient???s ability to self-administer the medication, and therapeutic goals? Yes    Prescription has been clinically reviewed: Yes      Baseline Quality of Life Assessment      How many days over the past month did your HS  keep you from your normal activities? For example, brushing your teeth or getting up in the morning. Patient declined to answer    Financial Information     Medication Assistance provided: Prior Authorization    Anticipated copay of $4 reviewed with patient. Verified delivery address.    Delivery Information     Scheduled delivery date: 10/1    Expected start date: 10/1      Medication will be delivered via UPS to the prescription address in Epic [place].  This shipment will not require a signature.      Explained the services we provide at [facility] and that each month we would call to set up refills.  Stressed importance of returning phone calls so that we could ensure they receive their medications in time each month.  Informed patient that we should be setting up refills 7-10 days prior to when they will run out of medication.  A pharmacist will reach out to perform a clinical assessment periodically.  Informed patient that a welcome packet, containing information about our pharmacy and other support services, a Notice of Privacy Practices, and a drug information handout will be sent.      The patient or caregiver noted above participated in the development of this care plan and knows that they can request review of or adjustments to the care plan at any time.  Patient or caregiver verbalized understanding of the above information as well as how to contact the pharmacy at [phone] option 4 with any questions/concerns.  The pharmacy is open Monday through Friday 8:30am-4:30pm.  A pharmacist is available 24/7 via pager to answer any clinical questions they may have.    Patient Specific Needs     Does the patient have any physical, cognitive, or cultural barriers? No    Does the patient have adequate living arrangements? (i.e. the ability to store and take their medication appropriately) Yes    Did you identify any home environmental safety or security hazards? No    Patient prefers to have medications discussed with  Patient     Is the patient or caregiver able to read and understand education materials at a high school level or above? Yes    Patient's primary language is  English     Is the patient high risk? No    SOCIAL DETERMINANTS OF HEALTH     At the [facility], we have learned that life circumstances - like trouble affording food, housing, utilities, or transportation can affect the health of many of our patients.   That is why we wanted to ask: are you currently experiencing any life circumstances that are negatively impacting your health and/or quality of life? Patient declined to answer    Social Determinants of Health     Food Insecurity: Food Insecurity Present (01/22/2023)    Received from [facility]    Hunger Vital Sign     Worried About Running Out of Food in the Last Year: Sometimes true     Ran Out of Food in the Last Year: Sometimes true   Internet Connectivity: Not on file   Housing/Utilities: Low Risk  (01/03/2022)    Housing/Utilities     Within the past 12 months, have you ever stayed: outside, in a car, in a tent, in an overnight shelter, or temporarily in someone else's home (i.e. couch-surfing)?: No     Are you worried about losing your housing?: No     Within the past 12 months, have you been unable to get utilities (heat, electricity) when it was really needed?: No   Tobacco Use: Medium Risk (03/31/2023)    Patient History     Smoking Tobacco Use: Former     Smokeless Tobacco Use: Never     Passive Exposure: Not on file   Transportation Needs: Unmet Transportation Needs (01/22/2023)    Received from [facility] - Transportation     Lack of Transportation (Medical): Yes     Lack of Transportation (Non-Medical): Yes   Alcohol Use: Patient Declined (01/22/2023)    Received from [facility]    AUDIT-C     Frequency of Alcohol Consumption: Patient declined     Average Number of Drinks: Patient declined     Frequency of Binge Drinking: Patient declined   Interpersonal Safety: Unknown (04/10/2023)    Interpersonal Safety     Unsafe Where You Currently Live: Not on file     Physically Hurt by Anyone: Not on file     Abused by Anyone: Not on file   Physical Activity: Unknown (01/22/2023)    Received from [facility]    Exercise Vital Sign     Days of Exercise per Week: Patient declined     Minutes of Exercise per Session: Not on file   Intimate Partner Violence: Not At Risk (01/22/2023)    Received  from [facility]    HITS     Over the last 12 months how often did your partner physically hurt you?: 1     Over the last 12 months how often did your partner insult you or talk down to you?: 1     Over the last 12 months how often did your partner threaten you with physical harm?: 1     Over the last 12 months how often did your partner scream or curse at you?: 1   Stress: Stress Concern Present (01/22/2023)    Received from [facility] - Occupational Stress Questionnaire     Feeling of Stress : Rather much   Substance Use: Not on file   Social Connections: Unknown (04/12/2022)    Received from [facility], [facility]    Social Network     Social Network: Not on file   Financial Resource Strain: Patient Declined (01/22/2023)    Received from [facility]    Overall Financial Resource Strain (CARDIA)     Difficulty of Paying Living Expenses: Patient declined   Depression: Not at risk (08/27/2022)    Received from [facility], [facility]    Depression     PHQ-2 Total Score: 0   Health Literacy: Not on file       Would you be willing to receive help with any of the needs that you have identified today? Not applicable       [facility] Specialty Pharmacist perform a clinical assessment periodically.  Informed patient that a welcome packet, containing information about our pharmacy and other support services, a Notice of Privacy Practices, and a drug information handout will be sent.      The patient or caregiver noted above participated in the development of this care plan and knows that they can request review of or adjustments to the care plan at any time.      Patient or caregiver verbalized understanding of the above information as well as how to contact the pharmacy at [phone] option 4 with any questions/concerns.  The pharmacy is open Monday through Friday 8:30am-4:30pm.  A pharmacist is available 24/7 via pager to answer any clinical questions they may have.    Patient Specific Needs     Does the patient have any physical, cognitive, or cultural barriers? {Blank single:19197::No,Yes - ***}    Does the patient have adequate living arrangements? (i.e. the ability to store and take their medication appropriately) {Blank single:19197::Yes,No - ***}    Did you identify any home environmental safety or security hazards? {Blank single:19197::No,Yes - ***}    Patient prefers to have medications discussed with  {Blank single:19197::Patient,Family Member,Caregiver,Other}     Is the patient or caregiver able to read and understand education materials at a high school level or above? {Blank single:19197::No,Yes}    Patient's primary language is  {Blank single:19197::English,Spanish,***}     Is the patient high risk? {sschighriskpts:78327}    SOCIAL DETERMINANTS OF HEALTH     At the [facility], we have learned that life circumstances - like trouble affording food, housing, utilities, or transportation can affect the health of many of our patients.   That is why we wanted to ask: are you currently experiencing any life circumstances that are negatively impacting your health and/or quality of life? {YES/NO/PATIENTDECLINED:93004}    Social Determinants of Health     Food Insecurity: Food  Insecurity Present (01/22/2023)    Received from [facility]    Hunger Vital Sign     Worried About Running Out of Food in the Last Year: Sometimes true     Ran Out of Food in the Last Year: Sometimes true   Internet Connectivity: Not on file   Housing/Utilities: Low Risk  (01/03/2022)    Housing/Utilities     Within the past 12 months, have you ever stayed: outside, in a car, in a tent, in an overnight shelter, or temporarily in someone else's home (i.e. couch-surfing)?: No     Are you worried about losing your housing?: No     Within the past 12 months, have you been unable to get utilities (heat, electricity) when it was really needed?: No   Tobacco Use: Medium Risk (03/31/2023)    Patient History     Smoking Tobacco Use: Former     Smokeless Tobacco Use: Never     Passive Exposure: Not on file   Transportation Needs: Unmet Transportation Needs (01/22/2023)    Received from [facility] - Transportation     Lack of Transportation (Medical): Yes     Lack of Transportation (Non-Medical): Yes   Alcohol Use: Patient Declined (01/22/2023)    Received from [facility]    AUDIT-C     Frequency of Alcohol Consumption: Patient declined     Average Number of Drinks: Patient declined     Frequency of Binge Drinking: Patient declined   Interpersonal Safety: Unknown (04/10/2023)    Interpersonal Safety     Unsafe Where You Currently Live: Not on file     Physically Hurt by Anyone: Not on file     Abused by Anyone: Not on file   Physical Activity: Unknown (01/22/2023)    Received from [facility]    Exercise Vital Sign     Days of Exercise per Week: Patient declined     Minutes of Exercise per Session: Not on file   Intimate Partner Violence: Not At Risk (01/22/2023)    Received from [facility]    HITS     Over the last 12 months how often did your partner physically hurt you?: 1     Over the last 12 months how often did your partner insult you or talk down to you?: 1     Over the last 12 months how often did your partner threaten you with physical harm?: 1     Over the last 12 months how often did your partner scream or curse at you?: 1   Stress: Stress Concern Present (01/22/2023)    Received from [facility] - Occupational Stress Questionnaire     Feeling of Stress : Rather much   Substance Use: Not on file   Social Connections: Unknown (04/12/2022)    Received from [facility], [facility]    Social Network     Social Network: Not on file   Financial Resource Strain: Patient Declined (01/22/2023)    Received from [facility]    Overall Financial Resource Strain (CARDIA)     Difficulty of Paying Living Expenses: Patient declined   Depression: Not at risk (08/27/2022)    Received from [facility], [facility]    Depression     PHQ-2 Total Score: 0   Health Literacy: Not on file       Would you be willing to receive help with any of  the needs that you have identified today? {Yes/No/Not applicable:93005}       [place] [facility] Specialty Pharmacist

## 2023-04-15 DIAGNOSIS — T8090XD Unspecified complication following infusion and therapeutic injection, subsequent encounter: Principal | ICD-10-CM

## 2023-04-15 DIAGNOSIS — L732 Hidradenitis suppurativa: Principal | ICD-10-CM

## 2023-05-02 NOTE — Unmapped (Unsigned)
Elizabeth Browning reports she's had no new flares, no change in old flares. She's had no issues with new unoready pen.     [facility] Clinical Assessment & Refill Coordination Note    Elizabeth Browning, DOB: [birth year]  Phone: There are no phone numbers on file.    All above HIPAA information was verified with patient.     Was a Nurse, learning disability used for this call? No    Specialty Medication(s):   Inflammatory Disorders: Cosentyx     Current Outpatient Medications   Medication Sig Dispense Refill    ADVAIR DISKUS 250-50 mcg/dose diskus INHALE 1 PUFF BY MOUTH TWICE DAILY 12 HOURS APART      albuterol HFA 90 mcg/actuation inhaler Inhale 2 puffs every four (4) hours as needed.      amoxicillin-clavulanate (AUGMENTIN) 875-125 mg per tablet Take 1 tablet by mouth two (2) times a day. 30 tablet 2    buprenorphine 7.5 mcg/hour PTWK transdermal patch Place 1 patch on the skin every seven (7) days. (Patient not taking: Reported on 10/01/2022)      cefdinir (OMNICEF) 300 MG capsule TAKE ONE 300 MG CAPSULE  BY MOUTH TWICE DAILY 60 capsule 2    DEXILANT 60 mg capsule every morning.      fluconazole (DIFLUCAN) 150 MG tablet Take 1 tablet (150 mg total) by mouth once a week. When on antibiotic (Patient not taking: Reported on 10/01/2022) 12 tablet 0    fluconazole (DIFLUCAN) 150 MG tablet Take 1 tablet (150 mg total) by mouth once a week. When on antibiotic 12 tablet 0    ipratropium-albuteroL (DUO-NEB) 0.5-2.5 mg/3 mL nebulizer USE 1 VIAL VIA NEBULIZER EVERY 4 TO 6 HOURS AS NEEDED FOR COUGH OR WHEEZING      methocarbamoL (ROBAXIN) 750 MG tablet Take 1 tablet (750 mg total) by mouth Three (3) times a day as needed. 30 tablet 2    montelukast (SINGULAIR) 10 mg tablet Take 1 tablet (10 mg total) by mouth.      mupirocin (BACTROBAN) 2 % ointment Apply topically Two (2) times a day for 3 days. Use Q-tip to apply a small amount to the inside of both nostrils, twice a day, for the 3 days prior to surgery (including AM of surgery). Pinch nose to distribute evenly. 22 g 0    naproxen (NAPROSYN) 500 MG tablet Take 1 tablet (500 mg total) by mouth. (Patient not taking: Reported on 10/01/2022)      omeprazole (PRILOSEC) 20 MG capsule Take 1 capsule (20 mg total) by mouth.      ondansetron (ZOFRAN-ODT) 4 MG disintegrating tablet Take 1 tablet (4 mg total) by mouth every eight (8) hours as needed.      oxyCODONE-acetaminophen (PERCOCET) 10-325 mg per tablet Take 1 tablet by mouth every six (6) hours as needed for pain. (Patient not taking: Reported on 03/06/2023) 30 tablet 0    oxyCODONE-acetaminophen (PERCOCET) 10-325 mg per tablet Take 1 tablet by mouth every six (6) hours as needed for pain. (Patient not taking: Reported on 03/06/2023) 20 tablet 0    pantoprazole (PROTONIX) 20 MG tablet Take 2 tablets (40 mg total) by mouth daily. Unable to report if taking      pregabalin (LYRICA) 25 MG capsule Take 1 capsule (25 mg total) by mouth Three (3) times a day for 14 days. (Patient not taking: Reported on 05/09/2022) 42 capsule 0    pregabalin (LYRICA) 50 MG capsule Take 1 capsule (50 mg total) by  mouth Three (3) times a day. (Patient not taking: Reported on 10/01/2022) 90 capsule 2    rosuvastatin (CRESTOR) 10 MG tablet Take 1 tablet (10 mg total) by mouth.      secukinumab (COSENTYX UNOREADY PEN) 300 mg/2 mL (150 mg/mL) PnIj Inject the contents of 1 pen (300 mg) under the skin every fourteen (14) days. 12 mL 3     No current facility-administered medications for this visit.        Changes to medications: Whitny reports no changes at this time.    Allergies   Allergen Reactions    Buprenorphine Hcl Hives     Unknown-pt denies    Codeine Rash, Hives and Itching    Infliximab-Axxq Itching    Adhesive Rash    Iodinated Contrast Media Rash    Latex Rash    Morphine Itching    Opioids - Morphine Analogues Itching     Unknown-pt denies         Changes to allergies: No    SPECIALTY MEDICATION ADHERENCE     Cosentyx - 0 left  Medication Adherence    Patient reported X missed doses in the last month: 0  Specialty Medication: Cosentyx          Specialty medication(s) dose(s) confirmed: Regimen is correct and unchanged.     Are there any concerns with adherence? No    Adherence counseling provided? Not needed    CLINICAL MANAGEMENT AND INTERVENTION      Clinical Benefit Assessment:    Do you feel the medicine is effective or helping your condition? Yes    Clinical Benefit counseling provided? Not needed    Adverse Effects Assessment:    Are you experiencing any side effects? No    Are you experiencing difficulty administering your medicine? No    Quality of Life Assessment:    Quality of Life    Rheumatology  Oncology  Dermatology  1. What impact has your specialty medication had on the symptoms of your skin condition (i.e. itchiness, soreness, stinging)?: Some  2. What impact has your specialty medication had on your comfort level with your skin?: Some  Cystic Fibrosis          How many days over the past month did your HS  keep you from your normal activities? For example, brushing your teeth or getting up in the morning. Patient declined to answer    Have you discussed this with your provider? Not needed    Acute Infection Status:    Acute infections noted within Epic:  No active infections  Patient reported infection: None    Therapy Appropriateness:    Is therapy appropriate based on current medication list, adverse reactions, adherence, clinical benefit and progress toward achieving therapeutic goals? Yes, therapy is appropriate and should be continued     DISEASE/MEDICATION-SPECIFIC INFORMATION      For patients on injectable medications: Patient currently has 0 doses left.  Next injection is scheduled for 10/29.    Chronic Inflammatory Diseases: Have you experienced any flares in the last month? No  Has this been reported to your provider? No    PATIENT SPECIFIC NEEDS     Does the patient have any physical, cognitive, or cultural barriers? No    Is the patient high risk? No    Did the patient require a clinical intervention? No    Does the patient require physician intervention or other additional services (i.e., nutrition, smoking cessation, social work)? No  SOCIAL DETERMINANTS OF HEALTH     At the [facility], we have learned that life circumstances - like trouble affording food, housing, utilities, or transportation can affect the health of many of our patients.   That is why we wanted to ask: are you currently experiencing any life circumstances that are negatively impacting your health and/or quality of life? Patient declined to answer    Social Determinants of Health     Food Insecurity: Food Insecurity Present (01/22/2023)    Received from [facility]    Hunger Vital Sign     Worried About Running Out of Food in the Last Year: Sometimes true     Ran Out of Food in the Last Year: Sometimes true   Internet Connectivity: Not on file   Housing/Utilities: Low Risk  (01/03/2022)    Housing/Utilities     Within the past 12 months, have you ever stayed: outside, in a car, in a tent, in an overnight shelter, or temporarily in someone else's home (i.e. couch-surfing)?: No     Are you worried about losing your housing?: No     Within the past 12 months, have you been unable to get utilities (heat, electricity) when it was really needed?: No   Tobacco Use: Medium Risk (03/31/2023)    Patient History     Smoking Tobacco Use: Former     Smokeless Tobacco Use: Never     Passive Exposure: Not on file   Transportation Needs: Unmet Transportation Needs (01/22/2023)    Received from [facility] - Transportation     Lack of Transportation (Medical): Yes     Lack of Transportation (Non-Medical): Yes   Alcohol Use: Patient Declined (01/22/2023)    Received from [facility]    AUDIT-C     Frequency of Alcohol Consumption: Patient declined     Average Number of Drinks: Patient declined     Frequency of Binge Drinking: Patient declined Interpersonal Safety: Unknown (05/05/2023)    Interpersonal Safety     Unsafe Where You Currently Live: Not on file     Physically Hurt by Anyone: Not on file     Abused by Anyone: Not on file   Physical Activity: Unknown (01/22/2023)    Received from [facility]    Exercise Vital Sign     Days of Exercise per Week: Patient declined     Minutes of Exercise per Session: Not on file   Intimate Partner Violence: Not At Risk (01/22/2023)    Received from [facility]    HITS     Over the last 12 months how often did your partner physically hurt you?: 1     Over the last 12 months how often did your partner insult you or talk down to you?: 1     Over the last 12 months how often did your partner threaten you with physical harm?: 1     Over the last 12 months how often did your partner scream or curse at you?: 1   Stress: Stress Concern Present (01/22/2023)    Received from [facility] - Occupational Stress Questionnaire     Feeling of Stress : Rather much   Substance Use: Not on file   Social Connections: Unknown (04/12/2022)    Received from [facility], [facility]    Social Network     Social Network: Not on file   Financial Resource Strain: Patient  Declined (01/22/2023)    Received from [facility]    Overall Financial Resource Strain (CARDIA)     Difficulty of Paying Living Expenses: Patient declined   Depression: Not at risk (08/27/2022)    Received from [facility], [facility]    Depression     PHQ-2 Total Score: 0   Health Literacy: Not on file       Would you be willing to receive help with any of the needs that you have identified today? Not applicable       SHIPPING     Specialty Medication(s) to be Shipped:   Inflammatory Disorders: Cosentyx    Other medication(s) to be shipped: No additional medications requested for fill at this time     Changes to insurance: No    Delivery Scheduled: Yes, Expected medication delivery date: 10/29.     Medication will be delivered via UPS to the confirmed prescription address in [facility].    The patient will receive a drug information handout for each medication shipped and additional FDA Medication Guides as required.  Verified that patient has previously received a Conservation officer, historic buildings and a Surveyor, mining.    The patient or caregiver noted above participated in the development of this care plan and knows that they can request review of or adjustments to the care plan at any time.      All of the patient's questions and concerns have been addressed.    Calise Dunckel [facility] previously received a Conservation officer, historic buildings and a Surveyor, mining.    The patient or caregiver noted above participated in the development of this care plan and knows that they can request review of or adjustments to the care plan at any time.      All of the patient's questions and concerns have been addressed.    Keiera Strathman [facility] Specialty Pharmacist

## 2023-05-12 MED FILL — COSENTYX UNOREADY PEN 300 MG/2 ML (150 MG/ML) SUBCUTANEOUS: SUBCUTANEOUS | 84 days supply | Qty: 12 | Fill #1

## 2023-06-05 NOTE — Unmapped (Signed)
Left message to call back  

## 2023-06-11 MED ORDER — FLUCONAZOLE 150 MG TABLET
ORAL_TABLET | 1 refills | 0 days
Start: 2023-06-11 — End: ?

## 2023-06-11 NOTE — Unmapped (Signed)
Refill request for diflucan 150 mg tablets.    LOV: 03/31/2023    Medication pended for your review.

## 2023-06-11 NOTE — Unmapped (Signed)
Rx for Diflucan is pended for you to review and sign, patient was last seen in clinic on 03/31/23.

## 2023-06-20 NOTE — Unmapped (Signed)
Pt left voicemail stating she fell about a month ago, broke a couple toes. She has been having back spasms x 1 week, no other injury since fall. Advised rest, OTC meds, will call back to office to schedule appointment if s/s continue.

## 2023-07-01 MED ORDER — OXYCODONE-ACETAMINOPHEN 10 MG-325 MG TABLET
ORAL_TABLET | Freq: Four times a day (QID) | ORAL | 0 refills | 8.00 days | Status: CN | PRN
Start: 2023-07-01 — End: ?

## 2023-07-01 NOTE — Unmapped (Signed)
Left message to call back  

## 2023-07-29 ENCOUNTER — Ambulatory Visit: Admit: 2023-07-29 | Discharge: 2023-07-30 | Payer: PRIVATE HEALTH INSURANCE | Attending: Medical | Primary: Medical

## 2023-07-29 DIAGNOSIS — L732 Hidradenitis suppurativa: Principal | ICD-10-CM

## 2023-07-29 DIAGNOSIS — Z79899 Other long term (current) drug therapy: Principal | ICD-10-CM

## 2023-07-29 MED ORDER — CEFDINIR 300 MG CAPSULE
ORAL_CAPSULE | 2 refills | 0.00 days | Status: CP
Start: 2023-07-29 — End: ?

## 2023-07-29 NOTE — Unmapped (Signed)
Dermatology Note     Assessment and Plan:      Hidradenitis Suppurativa, Hurley 3 Chronic: flared or not at treatment goal   - Previously tried and failed:   Humira 40 and 80 mg weekly, infliximab (had to discontinue due to infusion reactions that persisted despite premedications), golimumab 200 mg every 4 weeks, did not tolerate spironolactone (dizziness)  9/24: Failed infliximab and Cosentyx 300 q4week and 2 week    - We discussed the typical natural history, pathogenesis, treatment options, and expected course as well as the relapsing and sometimes recalcitrant nature of the disease.    - Reviewed chronic, waxing/waning nature of condition and associated comorbid conditions   - Reviewed role of antibiotics in HS to decrease inflammation   -- Based on discussion above and r/b/a reviewed, mutual decision to proceed with the following     - cefdinir (OMNICEF) 300 MG capsule; TAKE ONE 300 MG CAPSULE  BY MOUTH TWICE DAILY for 2 weeks for flares  -option to initiate treatment with Bimezelx, patient amenable to plan    High risk medication use  Quant gold negative 5/24    The patient was advised to call for an appointment should any new, changing, or symptomatic lesions develop.     RTC: No follow-ups on file. or sooner as needed   _________________________________________________________________      Chief Complaint     Chief Complaint   Patient presents with    Follow-up     HS -        HPI     Elizabeth Browning is a 58 y.o. female who presents as a returning patient (last seen 03/31/2023) to Dermatology for follow up of HS.  Her frequency of cosentyx was increased to q 2 weeks. Patient states she is in awful pain and that her HS is flaring in areas that were previously quiet. She requests pain medication    The patient denies any other new or changing lesions or areas of concern.     Pertinent Past Medical History     No history of skin cancer  - patient states nonsmoker    Previous surgeries: L thigh, bilateral axillae, bilateral buttocks surgery with Dr Lafayette Dragon [facility] plastic surgery with partial improvement in sx  Previously failed: Humira 40 and 80 mg weekly, infliximab (had to discontinue due to infusion reactions that persisted despite premedications), golimumab 200 mg every 4 weeks, did not tolerate spironolactone (dizziness)  9/24: Failed infliximab and Cosentyx q4week, frequency was increased to q2week.    Problem List       Hidradenitis suppurativa - Primary    Added automatically from request for surgery [id number]            Past Medical History, Family History, Social History, Medication List, Allergies, and Problem List were reviewed in the rooming section of Epic.     ROS: Other than symptoms mentioned in the HPI, no fevers, chills, or other skin complaints    Physical Examination     GENERAL: Well-appearing female in no acute distress, resting comfortably.  NEURO: Alert and oriented, answers questions appropriately  PSYCH: Normal mood and affect  SKIN (Focal Skin Exam): Per patient request, examination of axillae, inframammary, groin  was performed     Few active discrete non-tunneling lesions, mild erythema and oozing under panna.    All areas not commented on are within normal limits or unremarkable      (Approved Template 03/27/2020)

## 2023-07-29 NOTE — Unmapped (Addendum)
For pain medicine- reach out to Medicaid directly to have them authorize Dr Janyth Contes as a prescriber of opioid pain medicine for you.  Medicaid only approved opioid medications that are prescribed by Richardson Landry and Yevette Edwards at the moment

## 2023-07-30 DIAGNOSIS — L732 Hidradenitis suppurativa: Principal | ICD-10-CM

## 2023-07-30 MED ORDER — OXYCODONE-ACETAMINOPHEN 10 MG-325 MG TABLET
ORAL_TABLET | Freq: Four times a day (QID) | ORAL | 0 refills | 5.00 days | PRN
Start: 2023-07-30 — End: ?

## 2023-07-30 NOTE — Unmapped (Signed)
Patient LVM on nurse line stating she needs refill for her pain medication (opiates).  Per patient Dr Janyth Contes approved it .

## 2023-07-31 NOTE — Unmapped (Unsigned)
Refill request for oxycodone. LOV 07/29/2023. Medication pended for provider review.

## 2023-08-01 ENCOUNTER — Encounter: Admit: 2023-08-01 | Discharge: 2023-08-02 | Payer: PRIVATE HEALTH INSURANCE

## 2023-08-01 DIAGNOSIS — L732 Hidradenitis suppurativa: Principal | ICD-10-CM

## 2023-08-01 DIAGNOSIS — Z79899 Other long term (current) drug therapy: Principal | ICD-10-CM

## 2023-08-01 DIAGNOSIS — L910 Hypertrophic scar: Principal | ICD-10-CM

## 2023-08-01 DIAGNOSIS — G8929 Other chronic pain: Principal | ICD-10-CM

## 2023-08-01 MED ORDER — BIMEKIZUMAB-BKZX 320 MG/2 ML SUBCUTANEOUS AUTO-INJECTOR
SUBCUTANEOUS | 0 refills | 0.00 days | Status: CP
Start: 2023-08-01 — End: ?

## 2023-08-01 MED ORDER — OXYCODONE-ACETAMINOPHEN 5 MG-325 MG TABLET
ORAL_TABLET | ORAL | 0 refills | 2 days | Status: CP | PRN
Start: 2023-08-01 — End: ?

## 2023-08-02 NOTE — Unmapped (Signed)
Teledermatology Note- Return Patient   08/01/2023      Encounter Description/Consent: This encounter was conducted from Provider home office  via live, face-to-face video conference with the patient. Elizabeth Browning was located in [place].  The patient verified her identity with her date of birth and verbally consented to evaluation and management of her condition through telemedicine.     Total time spent reviewing chart, uploaded images, and discussion with patient: 12 minutes      The patient reports they are physically located in [place] and is currently: at home. I conducted a audio/video visit. I spent  33m 50s on the video call with the patient. I spent an additional 5 minutes on pre- and post-visit activities on the date of service .     NOTE: Telemedicine enables health care providers at different locations to provide safe, effective, and convenient care through the use of technology. As with any health care service, there are risks associated with the use of telemedicine and teledermatology, including lack of visualization,and there may be instances where the patient needs to be seen is person  This visit was performed via telemedicine during the COVID-19 Health Crisis during a State of National Emergency.   The impression and recommendations were made on the basis of the history obtained via phone or video connection  and the physical exam reviewed electronically, which is limited by/to the uploaded images and their intrinsic quality.  All questions were answered, and the patient agreed to proceed.    Impression/ Plan:    Hidradenitis Suppurativa, Hurley 3 Chronic: flared or not at treatment goal   - Previously tried and failed:   Humira 40 and 80 mg weekly, infliximab (had to discontinue due to infusion reactions that persisted despite premedications), golimumab 200 mg every 4 weeks, did not tolerate spironolactone (dizziness)  9/24: Failed infliximab and Cosentyx 300 q4week and 2 week    - We discussed the typical natural history, pathogenesis, treatment options, and expected course as well as the relapsing and sometimes recalcitrant nature of the disease.    - Reviewed chronic, waxing/waning nature of condition and associated comorbid conditions   - Reviewed role of antibiotics in HS to decrease inflammation   -- Based on discussion above and r/b/a reviewed, mutual decision to proceed with the following   - cefdinir (OMNICEF) 300 MG capsule; TAKE ONE 300 MG CAPSULE  BY MOUTH TWICE DAILY for 2 weeks for flares  -continue plan to Start bimekizumab 320mg  every 2 weeks for 16 weeks then 320mg  every 4 weeks for maintenance   -given uncontrolled pain,  start oxycodone/acetaminophen 5/325mg  q6 H prn for pain.  I let her know we could provide a prescription every 1-2 months on an as needed basis, but if the need become consistently greater over time we would need to refer her to a pain clinic.    High risk medication use  Quant gold negative 5/24    The patient was advised to call for an appointment should any new, changing, or symptomatic lesions develop.     RTC: No follow-ups on file. or sooner as needed   _________________________________________________________________      Chief Complaint     Chief Complaint   Patient presents with    Follow-up     HS -        HPI     Elizabeth Browning is a 58 y.o. female who presents as a returning patient (last seen 07/29/2023) to Dermatology for follow  up of HS.  Her frequency of cosentyx was increased to q 2 weeks in fall 2024, but feels like she was getting worse. She was changed to bimekizumab earlier in the week and it is pending authorization. She is still in a lot of pain and Medicaid restricted providers so she couldn't have anything prescribed earlier this week. She adjusted it so that I am a listed provider and we needed to have a brief visit to update records and do appropriate opiate counseling today.    The patient denies any other new or changing lesions or areas of concern.     Pertinent Past Medical History     No history of skin cancer  - patient states nonsmoker    Previous surgeries: L thigh, bilateral axillae, bilateral buttocks surgery with Dr Lafayette Dragon [facility] plastic surgery with partial improvement in sx  Previously failed: Humira 40 and 80 mg weekly, infliximab (had to discontinue due to infusion reactions that persisted despite premedications), golimumab 200 mg every 4 weeks, did not tolerate spironolactone (dizziness)  9/24: Failed infliximab and Cosentyx q4week, frequency was increased to q2week.    Problem List       Hidradenitis suppurativa - Primary    Added automatically from request for surgery [id number]            Past Medical History, Family History, Social History, Medication List, Allergies, and Problem List were reviewed in the rooming section of Epic.     ROS: Other than symptoms mentioned in the HPI, no fevers, chills, or other skin complaints    Physical Examination     GENERAL: Well-appearing female in no acute distress, resting comfortably.  NEURO: Alert and oriented, answers questions appropriately  PSYCH: Normal mood and affect  SKIN (Focal Skin Exam): Per patient request, examination of axillae, inframammary, groin  was performed    Few active discrete non-tunneling lesions, mild erythema and oozing under panna.    All areas not commented on are within normal limits or unremarkable    PE:    General: The patient sits comfortably in view of the camera.  The patient's breathing is observed to be comfortable and normal.  The patient is in no acute distress.  All extremity movements appear intact.  There are no focal neurological deficits observed.   Neuro: Alert, answering questions appropriately.    No images to review    - All other areas examined via photos/video were normal or had no significant findings      Leonette Nutting, MD  08/01/2023

## 2023-08-02 NOTE — Unmapped (Signed)
I've contacted patient and will have brief VV with her today to update records with me and to allow for prescribing.

## 2023-08-05 DIAGNOSIS — L732 Hidradenitis suppurativa: Principal | ICD-10-CM

## 2023-08-13 MED ORDER — OXYCODONE-ACETAMINOPHEN 10 MG-325 MG TABLET
ORAL_TABLET | Freq: Four times a day (QID) | ORAL | 0 refills | 5.00 days | PRN
Start: 2023-08-13 — End: ?

## 2023-08-18 NOTE — Unmapped (Signed)
 Left message to call back

## 2023-08-19 NOTE — Unmapped (Signed)
[facility] Refill Coordination Note    Specialty Medication(s) to be Shipped:   Inflammatory Disorders: Cosentyx    Other medication(s) to be shipped: No additional medications requested for fill at this time     Elizabeth Browning, DOB: [birth year]  Phone: There are no phone numbers on file.      All above HIPAA information was verified with patient.     Was a Nurse, learning disability used for this call? No    Completed refill call assessment today to schedule patient's medication shipment from the [facility] [phone]).  All relevant notes have been reviewed.     Specialty medication(s) and dose(s) confirmed: Regimen is correct and unchanged. PA pending for Bimzelx. Patient states she is to continue Cosentyx until new medication is approved by insurance.  Changes to medications: Mckaila reports no changes at this time.  Changes to insurance: No  New side effects reported not previously addressed with a pharmacist or physician: None reported  Questions for the pharmacist: No    Confirmed patient received a Conservation officer, historic buildings and a Surveyor, mining with first shipment. The patient will receive a drug information handout for each medication shipped and additional FDA Medication Guides as required.       DISEASE/MEDICATION-SPECIFIC INFORMATION        For patients on injectable medications: Patient currently has 1 doses left.  Next injection is scheduled for 02/7.    SPECIALTY MEDICATION ADHERENCE     Medication Adherence    Patient reported X missed doses in the last month: 0  Specialty Medication: secukinumab (COSENTYX UNOREADY PEN) 300 mg/2 mL (150 mg/mL) PnIj  Patient is on additional specialty medications: No  Informant: patient                Were doses missed due to medication being on hold? No    simponi 100 mg/ml: 3 days of medicine on hand         REFERRAL TO PHARMACIST     Referral to the pharmacist: Not needed      [facility]     Shipping address confirmed in Epic. Delivery Scheduled: Yes, Expected medication delivery date: 02/06.     Medication will be delivered via UPS to the prescription address in Epic WAM.    Alwyn Pea   [facility] Specialty Technician

## 2023-08-21 MED FILL — COSENTYX UNOREADY PEN 300 MG/2 ML (150 MG/ML) SUBCUTANEOUS: SUBCUTANEOUS | 84 days supply | Qty: 12 | Fill #2

## 2023-08-28 DIAGNOSIS — Z79899 Other long term (current) drug therapy: Principal | ICD-10-CM

## 2023-08-28 NOTE — Unmapped (Signed)
TC to Ms. Addis. Informed her that In order for Bimzelx to get covered we need Hep B and Quant gold labs drawn. She states she will probably use [facility]. She will check with the one nearest her and provide [place] with the fax number.

## 2023-10-06 DIAGNOSIS — L732 Hidradenitis suppurativa: Principal | ICD-10-CM

## 2023-10-06 MED ORDER — CEFDINIR 300 MG CAPSULE
ORAL_CAPSULE | 1 refills | 0 days | Status: CP
Start: 2023-10-06 — End: ?

## 2023-11-03 ENCOUNTER — Encounter: Admit: 2023-11-03 | Discharge: 2023-11-04

## 2023-11-03 DIAGNOSIS — L910 Hypertrophic scar: Principal | ICD-10-CM

## 2023-11-03 DIAGNOSIS — Z79899 Other long term (current) drug therapy: Principal | ICD-10-CM

## 2023-11-03 DIAGNOSIS — L732 Hidradenitis suppurativa: Principal | ICD-10-CM

## 2023-11-03 NOTE — Unmapped (Signed)
 Teledermatology Note- Return Patient   11/03/2023      Encounter Description/Consent: This encounter was conducted from Provider home office  via live, face-to-face video conference with the patient. Elizabeth Browning was located in [place].  The patient verified her identity with her date of birth and verbally consented to evaluation and management of her condition through telemedicine.     Total time spent reviewing chart, uploaded images, and discussion with patient: 11 minutes      The patient reports they are physically located in [place]  and is currently: at home. I conducted a audio/video visit. I spent  6 on the video call with the patient. I spent an additional 5 minutes on pre- and post-visit activities on the date of service .     NOTE: Telemedicine enables health care providers at different locations to provide safe, effective, and convenient care through the use of technology. As with any health care service, there are risks associated with the use of telemedicine and teledermatology, including lack of visualization,and there may be instances where the patient needs to be seen is person  This visit was performed via telemedicine during the COVID-19 Health Crisis during a State of National Emergency.   The impression and recommendations were made on the basis of the history obtained via phone or video connection  and the physical exam reviewed electronically, which is limited by/to the uploaded images and their intrinsic quality.  All questions were answered, and the patient agreed to proceed.    Impression/ Plan:    Hidradenitis Suppurativa, Hurley 3 Chronic: flared or not at treatment goal   - Previously tried and failed:   Humira  40 and 80 mg weekly, infliximab  (had to discontinue due to infusion reactions that persisted despite premedications), golimumab  200 mg every 4 weeks, did not tolerate spironolactone  (dizziness)  9/24: Failed infliximab  and Cosentyx  300 q4week and 2 week    - We discussed the typical natural history, pathogenesis, treatment options, and expected course as well as the relapsing and sometimes recalcitrant nature of the disease.    - Reviewed chronic, waxing/waning nature of condition and associated comorbid conditions   - Reviewed role of antibiotics in HS to decrease inflammation   -- Based on discussion above and r/b/a reviewed, mutual decision to proceed with the following   - cefdinir  (OMNICEF ) 300 MG capsule; TAKE ONE 300 MG CAPSULE  BY MOUTH TWICE DAILY for 2 weeks for flares  -continue plan to Start bimekizumab 320mg  every 2 weeks for 16 weeks then 320mg  every 4 weeks for maintenance   -occasionally providing oxycodone /acetaminophen  5/325mg  q6 H prn for pain.  I let her know we could provide a prescription every 1-2 months on an as needed basis, but if the need become consistently greater over time we would need to refer her to a pain clinic.    High risk medication use  Quant gold negative 12/2021    The patient was advised to call for an appointment should any new, changing, or symptomatic lesions develop.     RTC: 4 months  _________________________________________________________________      Chief Complaint     Chief Complaint   Patient presents with    Follow-up     HS -        HPI     Elizabeth Browning is a 58 y.o. female who presents as a returning patient (last seen 08/01/2023) to Dermatology for follow up of HS.  We were planning to start Bimzelx, but  she needed updated lab work and never got it.  In the meantime, she has had both knees replaced.  This has been complicated by joint infection from her knee  replacement in March and she just finished a course of IV antibiotics through a PICC line at home yesterday.  They are still planning to reassess to see if that she continue the antibiotics or not.  She has not been on any specific treatments for her hidradenitis while getting the IV antibiotics and is still not had a chance to get her labs drawn.  Her hidradenitis is still pretty active today.      Pertinent Past Medical History     No history of skin cancer  - patient states nonsmoker    Previous surgeries: L thigh, bilateral axillae, bilateral buttocks surgery with Dr Gavin Kast [facility] plastic surgery with partial improvement in sx  Previously failed: Humira  40 and 80 mg weekly, infliximab  (had to discontinue due to infusion reactions that persisted despite premedications), golimumab  200 mg every 4 weeks, did not tolerate spironolactone  (dizziness)  9/24: Failed infliximab  and Cosentyx  q4week, frequency was increased to q2weeks.    Problem List       Hidradenitis suppurativa - Primary    Added automatically from request for surgery [id number]            Past Medical History, Family History, Social History, Medication List, Allergies, and Problem List were reviewed in the rooming section of Epic.     ROS: Other than symptoms mentioned in the HPI, no fevers, chills, or other skin complaints    Physical Examination     GENERAL: Well-appearing female in no acute distress, resting comfortably.  NEURO: Alert and oriented, answers questions appropriately  PSYCH: Normal mood and affect  SKIN (Focal Skin Exam): Per patient request, examination of axillae, inframammary, groin  was performed    Few active discrete non-tunneling lesions, mild erythema and oozing under panna.    All areas not commented on are within normal limits or unremarkable    PE:    General: The patient sits comfortably in view of the camera.  The patient's breathing is observed to be comfortable and normal.  The patient is in no acute distress.  All extremity movements appear intact.  There are no focal neurological deficits observed.   Neuro: Alert, answering questions appropriately.    No images to review    - All other areas examined via photos/video were normal or had no significant findings      Sondra Duran, MD  11/03/2023

## 2023-11-04 NOTE — Unmapped (Signed)
 Patient left voicemail stating she needs an appointment with Dr. Gavin Kast before June because she's having issues with her surgery site. No other information. Per chart review, patient last seen in clinic 08/06/21 for follow up after removal of hidradenitis lesions from buttock/sacrum and thigh. Mychart message sent to patient.

## 2023-11-13 NOTE — Unmapped (Signed)
 The [facility] has made a second and final attempt to reach this patient to refill the following medication:Cosentyx.      We have left voicemails on the following phone numbers: [phone], have sent a MyChart message, and have sent a text message to the following phone numbers: [phone] .    Dates contacted: 11/07/2023  11/13/2023  Last scheduled delivery: 08/22/2023-Note 84 day supply    The patient may be at risk of non-compliance with this medication. The patient should call the [facility] at [phone]  Option 4, then Option 2: Dermatology, Gastroenterology, Rheumatology to refill medication.    Elizabeth Browning

## 2023-11-17 NOTE — Unmapped (Signed)
 [facility] Refill Coordination Note    Specialty Medication(s) to be Shipped:   Inflammatory Disorders: Cosentyx    Other medication(s) to be shipped: No additional medications requested for fill at this time     Elizabeth Browning, DOB: Jan 20, 1966  Phone: There are no phone numbers on file.      All above HIPAA information was verified with patient.     Was a Nurse, learning disability used for this call? No    Completed refill call assessment today to schedule patient's medication shipment from the [facility]  [phone]).  All relevant notes have been reviewed.     Specialty medication(s) and dose(s) confirmed: Regimen is correct and unchanged.   Changes to medications: Cynthia reports no changes at this time.  Changes to insurance: No  New side effects reported not previously addressed with a pharmacist or physician: None reported  Questions for the pharmacist: No    Confirmed patient received a Conservation officer, historic buildings and a Surveyor, mining with first shipment. The patient will receive a drug information handout for each medication shipped and additional FDA Medication Guides as required.       DISEASE/MEDICATION-SPECIFIC INFORMATION        For patients on injectable medications: Patient currently has 0 doses left.  Next injection is scheduled for 11/13/23.    SPECIALTY MEDICATION ADHERENCE     Medication Adherence    Patient reported X missed doses in the last month: 0  Specialty Medication: secukinumab (COSENTYX UNOREADY PEN) 300 mg/2 mL (150 mg/mL) PnIj  Patient is on additional specialty medications: No  Patient is on more than two specialty medications: No              Were doses missed due to medication being on hold? No    COSENTYX UNOREADY PEN 300 mg/2 mL (150 mg/mL) Pnij (secukinumab)  : 0 days of medicine on hand       REFERRAL TO PHARMACIST     Referral to the pharmacist: Not needed      [facility]     Shipping address confirmed in Epic.     Cost and Payment: pt unable to afford copay at this time,     Delivery Scheduled: Yes, Expected medication delivery date: 11/19/23.     Medication will be delivered via UPS to the prescription address in Epic WAM.    Almeta Arm   [facility]  Specialty Technician

## 2023-11-18 MED FILL — COSENTYX UNOREADY PEN 300 MG/2 ML SUBCUTANEOUS PEN INJECTOR: SUBCUTANEOUS | 84 days supply | Qty: 12 | Fill #3

## 2024-02-18 NOTE — Unmapped (Signed)
 [facility] Refill Coordination Note    Specialty Medication(s) to be Shipped:   Inflammatory Disorders: Cosentyx     Other medication(s) to be shipped: No additional medications requested for fill at this time     Elizabeth Browning, DOB: Dec 09, 1965  Phone: There are no phone numbers on file.      All above HIPAA information was verified with patient.     Was a Nurse, learning disability used for this call? No    Completed refill call assessment today to schedule patient's medication shipment from the [facility] and Home Delivery Pharmacy  [phone]).  All relevant notes have been reviewed.     Specialty medication(s) and dose(s) confirmed: Regimen is correct and unchanged.   Changes to medications: [name] reports no changes at this time.  Changes to insurance: No  New side effects reported not previously addressed with a pharmacist or physician: None reported  Questions for the pharmacist: No    Confirmed patient received a Conservation officer, historic buildings and a Surveyor, mining with first shipment. The patient will receive a drug information handout for each medication shipped and additional FDA Medication Guides as required.       DISEASE/MEDICATION-SPECIFIC INFORMATION        For patients on injectable medications: Patient currently has 0 doses left.  Next injection is scheduled for 02/27/24. Pt states she takes on the 1st and 15th of each month to remember    SPECIALTY MEDICATION ADHERENCE     Medication Adherence    Patient reported X missed doses in the last month: 0  Specialty Medication: secukinumab  (COSENTYX  UNOREADY PEN) 300 mg/2 mL PnIj  Patient is on additional specialty medications: No  Informant: patient              Were doses missed due to medication being on hold? No    COSENTYX  UNOREADY PEN 300 mg/2 mL (150 mg/mL) Pnij (secukinumab )  : 0 days of medicine on hand       REFERRAL TO PHARMACIST     Referral to the pharmacist: Not needed      [facility]     Shipping address confirmed in Epic.     Cost and Payment: pt unable to afford copay at this time,     Delivery Scheduled: Yes, Expected medication delivery date: 02/25/24.     Medication will be delivered via UPS to the prescription address in Epic WAM.    [facility]

## 2024-02-24 MED FILL — COSENTYX UNOREADY PEN 300 MG/2 ML SUBCUTANEOUS PEN INJECTOR: SUBCUTANEOUS | 56 days supply | Qty: 8 | Fill #4

## 2024-04-05 NOTE — Unmapped (Unsigned)
 Teledermatology Note- Return Patient   04/05/2024      Encounter Description/Consent: This encounter was conducted from Provider home office  via live, face-to-face video conference with the patient. Elizabeth Browning was located in [place].  The patient verified her identity with her date of birth and verbally consented to evaluation and management of her condition through telemedicine.     Total time spent reviewing chart, uploaded images, and discussion with patient: 11 minutes    {    Coding tips - Do not edit this text, it will delete upon signing of note!    Telephone visits [phone] for Physicians and APPs and [phone] for Non- Physician Clinicians)- Only use minutes on the phone to determine level of service.    Video visits [phone]) - Use either level of medical decision making just as an in-person visit OR time which includes both minutes on video and pre/post minutes to determine the level of service.      :[id number]}  The patient reports they are physically located in [place]  and is currently: at home. I conducted a audio/video visit. I spent  6 on the video call with the patient. I spent an additional 5 minutes on pre- and post-visit activities on the date of service .     NOTE: Telemedicine enables health care providers at different locations to provide safe, effective, and convenient care through the use of technology. As with any health care service, there are risks associated with the use of telemedicine and teledermatology, including lack of visualization,and there may be instances where the patient needs to be seen is person  This visit was performed via telemedicine during the COVID-19 Health Crisis during a State of National Emergency.   The impression and recommendations were made on the basis of the history obtained via phone or video connection  and the physical exam reviewed electronically, which is limited by/to the uploaded images and their intrinsic quality.  All questions were answered, and the patient agreed to proceed.    Impression/ Plan:    Hidradenitis Suppurativa, Hurley 3 Chronic: flared or not at treatment goal   - Previously tried and failed:   Humira  40 and 80 mg weekly, infliximab  (had to discontinue due to infusion reactions that persisted despite premedications), golimumab  200 mg every 4 weeks, did not tolerate spironolactone  (dizziness)  9/24: Failed infliximab  and Cosentyx  300 q4week and 2 week    - We discussed the typical natural history, pathogenesis, treatment options, and expected course as well as the relapsing and sometimes recalcitrant nature of the disease.    - Reviewed chronic, waxing/waning nature of condition and associated comorbid conditions   - Reviewed role of antibiotics in HS to decrease inflammation   -- Based on discussion above and r/b/a reviewed, mutual decision to proceed with the following   - cefdinir  (OMNICEF ) 300 MG capsule; TAKE ONE 300 MG CAPSULE  BY MOUTH TWICE DAILY for 2 weeks for flares  -continue plan to Start bimekizumab  320mg  every 2 weeks for 16 weeks then 320mg  every 4 weeks for maintenance   -occasionally providing oxycodone /acetaminophen  5/325mg  q6 H prn for pain.  I let her know we could provide a prescription every 1-2 months on an as needed basis, but if the need become consistently greater over time we would need to refer her to a pain clinic.    High risk medication use  Quant gold negative 12/2021    The patient was advised to call for an appointment should any  new, changing, or symptomatic lesions develop.     RTC: 4 months  _________________________________________________________________      Chief Complaint     Chief Complaint   Patient presents with    Follow-up     HS -        HPI     Elizabeth Browning is a 58 y.o. female who presents as a returning patient (last seen 10/2023) to Dermatology for follow up of HS.  We were planning to start Bimzelx , but she needed updated lab work and never got it.  In the meantime, she has had both knees replaced.  This has been complicated by joint infection from her knee  replacement in March and she just finished a course of IV antibiotics through a PICC line at home yesterday.  They are still planning to reassess to see if that she continue the antibiotics or not.  She has not been on any specific treatments for her hidradenitis while getting the IV antibiotics and is still not had a chance to get her labs drawn.  Her hidradenitis is still pretty active today.      Pertinent Past Medical History     No history of skin cancer  - patient states nonsmoker    Previous surgeries: L thigh, bilateral axillae, bilateral buttocks surgery with Dr Kirt [facility] plastic surgery with partial improvement in sx  Previously failed: Humira  40 and 80 mg weekly, infliximab  (had to discontinue due to infusion reactions that persisted despite premedications), golimumab  200 mg every 4 weeks, did not tolerate spironolactone  (dizziness)  9/24: Failed infliximab  and Cosentyx  q4week, frequency was increased to q2weeks.    Problem List       Hidradenitis suppurativa - Primary    Added automatically from request for surgery [id number]            Past Medical History, Family History, Social History, Medication List, Allergies, and Problem List were reviewed in the rooming section of Epic.     ROS: Other than symptoms mentioned in the HPI, no fevers, chills, or other skin complaints    Physical Examination       PE:    General: The patient sits comfortably in view of the camera.  The patient's breathing is observed to be comfortable and normal.  The patient is in no acute distress.  All extremity movements appear intact.  There are no focal neurological deficits observed.   Neuro: Alert, answering questions appropriately.    No images to review    - All other areas examined via photos/video were normal or had no significant findings      Lonni JINNY Rank, MD  04/05/2024

## 2024-04-07 DIAGNOSIS — Z79899 Other long term (current) drug therapy: Principal | ICD-10-CM

## 2024-04-07 NOTE — Unmapped (Unsigned)
 errorThis encounter was created in error - please disregard. questions were answered, and the patient agreed to proceed.    Impression/ Plan:    Hidradenitis Suppurativa, Hurley 3 Chronic: flared or not at treatment goal   - Previously tried and failed:   Humira  40 and 80 mg weekly, infliximab  (had to discontinue due to infusion reactions that persisted despite premedications), golimumab  200 mg every 4 weeks, did not tolerate spironolactone  (dizziness)  9/24: Failed infliximab  and Cosentyx  300 q4week and 2 week    - We discussed the typical natural history, pathogenesis, treatment options, and expected course as well as the relapsing and sometimes recalcitrant nature of the disease.    - Reviewed chronic, waxing/waning nature of condition and associated comorbid conditions   - Reviewed role of antibiotics in HS to decrease inflammation   -- Based on discussion above and r/b/a reviewed, mutual decision to proceed with the following   - cefdinir  (OMNICEF ) 300 MG capsule; TAKE ONE 300 MG CAPSULE  BY MOUTH TWICE DAILY for 2 weeks for flares  -continue plan to Start bimekizumab  320mg  every 2 weeks for 16 weeks then 320mg  every 4 weeks for maintenance   -occasionally providing oxycodone /acetaminophen  5/325mg  q6 H prn for pain.  I let her know we could provide a prescription every 1-2 months on an as needed basis, but if the need become consistently greater over time we would need to refer her to a pain clinic.    High risk medication use  Quant gold negative 12/2021    The patient was advised to call for an appointment should any new, changing, or symptomatic lesions develop.     RTC: 4 months  _________________________________________________________________      Chief Complaint     Chief Complaint   Patient presents with    Follow-up     HS -        HPI     Elizabeth Browning is a 58 y.o. female who presents as a returning patient (last seen 10/2023) to Dermatology for follow up of HS.  We were planning to start Bimzelx .      Pertinent Past Medical History     No history of skin cancer  - patient states nonsmoker    Previous surgeries: L thigh, bilateral axillae, bilateral buttocks surgery with Dr Kirt [facility] plastic surgery with partial improvement in sx  Previously failed: Humira  40 and 80 mg weekly, infliximab  (had to discontinue due to infusion reactions that persisted despite premedications), golimumab  200 mg every 4 weeks, did not tolerate spironolactone  (dizziness)  9/24: Failed infliximab  and Cosentyx  q4week, frequency was increased to q2weeks.    Problem List       Hidradenitis suppurativa - Primary    Added automatically from request for surgery [id number]            Past Medical History, Family History, Social History, Medication List, Allergies, and Problem List were reviewed in the rooming section of Epic.     ROS: Other than symptoms mentioned in the HPI, no fevers, chills, or other skin complaints    Physical Examination       PE:    General: The patient sits comfortably in view of the camera.  The patient's breathing is observed to be comfortable and normal.  The patient is in no acute distress.  All extremity movements appear intact.  There are no focal neurological deficits observed.   Neuro: Alert, answering questions appropriately.    No images to review    -  All other areas examined via photos/video were normal or had no significant findings      Isaiah Nurse, PA-C  04/07/2024

## 2024-04-21 DIAGNOSIS — L732 Hidradenitis suppurativa: Principal | ICD-10-CM

## 2024-04-21 MED ORDER — COSENTYX UNOREADY PEN 300 MG/2 ML SUBCUTANEOUS PEN INJECTOR
SUBCUTANEOUS | 3 refills | 84.00000 days
Start: 2024-04-21 — End: ?

## 2024-04-21 NOTE — Unmapped (Signed)
 Prescription refill request for cosentyx , Last office visit was 11/03/23    Please Advise

## 2024-04-22 DIAGNOSIS — L732 Hidradenitis suppurativa: Principal | ICD-10-CM

## 2024-04-22 MED ORDER — COSENTYX UNOREADY PEN 300 MG/2 ML SUBCUTANEOUS PEN INJECTOR
SUBCUTANEOUS | 3 refills | 84.00000 days | Status: CP
Start: 2024-04-22 — End: ?

## 2024-04-22 NOTE — Unmapped (Signed)
 Hi Elizabeth Browning,    Can you reach out to patient to set up a virtual visit with Dr. Paulita?    Thanks,  Bear [employer]arns

## 2024-04-22 NOTE — Unmapped (Signed)
 Hi,    Can you reach out to pay to schedule an appointment in FEB/March w/ Dr. Paulita?    Thanks,  Bear Stearns
